# Patient Record
Sex: Female | Born: 1985 | Race: Black or African American | Hispanic: No | Marital: Single | Smoking: Current every day smoker
Health system: Southern US, Community
[De-identification: ages and names within clinical notes are randomized; demographics above are authoritative.]

## PROBLEM LIST (undated history)

## (undated) ENCOUNTER — Ambulatory Visit (HOSPITAL_COMMUNITY): Discharge status: Absent without leave | Payer: Self-pay

## (undated) DIAGNOSIS — I10 Essential (primary) hypertension: Secondary | ICD-10-CM

## (undated) DIAGNOSIS — K859 Acute pancreatitis without necrosis or infection, unspecified: Secondary | ICD-10-CM

## (undated) DIAGNOSIS — F419 Anxiety disorder, unspecified: Secondary | ICD-10-CM

## (undated) HISTORY — DX: Anxiety disorder, unspecified: F41.9

## (undated) HISTORY — DX: Essential (primary) hypertension: I10

---
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
## 2007-01-12 ENCOUNTER — Inpatient Hospital Stay (HOSPITAL_COMMUNITY): Admission: AD | Admit: 2007-01-12 | Discharge: 2007-01-12 | Payer: Self-pay | Admitting: Obstetrics

## 2007-02-18 ENCOUNTER — Ambulatory Visit (HOSPITAL_COMMUNITY): Admission: RE | Admit: 2007-02-18 | Discharge: 2007-02-18 | Payer: Self-pay | Admitting: Obstetrics & Gynecology

## 2007-02-23 ENCOUNTER — Ambulatory Visit (HOSPITAL_COMMUNITY): Admission: RE | Admit: 2007-02-23 | Discharge: 2007-02-23 | Payer: Self-pay | Admitting: Obstetrics & Gynecology

## 2007-02-24 ENCOUNTER — Inpatient Hospital Stay (HOSPITAL_COMMUNITY): Admission: AD | Admit: 2007-02-24 | Discharge: 2007-02-25 | Payer: Self-pay | Admitting: Obstetrics

## 2007-03-07 ENCOUNTER — Inpatient Hospital Stay (HOSPITAL_COMMUNITY): Admission: AD | Admit: 2007-03-07 | Discharge: 2007-03-07 | Payer: Self-pay | Admitting: Obstetrics & Gynecology

## 2007-03-16 ENCOUNTER — Inpatient Hospital Stay (HOSPITAL_COMMUNITY): Admission: AD | Admit: 2007-03-16 | Discharge: 2007-03-21 | Payer: Self-pay | Admitting: Obstetrics

## 2007-03-18 ENCOUNTER — Encounter: Payer: Self-pay | Admitting: Obstetrics & Gynecology

## 2007-11-17 ENCOUNTER — Emergency Department (HOSPITAL_COMMUNITY): Admission: EM | Admit: 2007-11-17 | Discharge: 2007-11-17 | Payer: Self-pay | Admitting: Emergency Medicine

## 2008-12-15 ENCOUNTER — Emergency Department (HOSPITAL_COMMUNITY): Admission: EM | Admit: 2008-12-15 | Discharge: 2008-12-15 | Payer: Self-pay | Admitting: Family Medicine

## 2009-03-24 IMAGING — US US OB RE-EVAL
1 series · 14 of 28 positions shown · non-contrast
Comparison: none

OBSTETRICAL ULTRASOUND:

 This ultrasound exam was performed in the [HOSPITAL] Ultrasound Department.  The OB US report was generated in the AS system, and faxed to the ordering physician.  This report is also available in [REDACTED] PACS.

[Series 1: us ob re-eval · 14 of 29 slices shown]
[im 2/29]
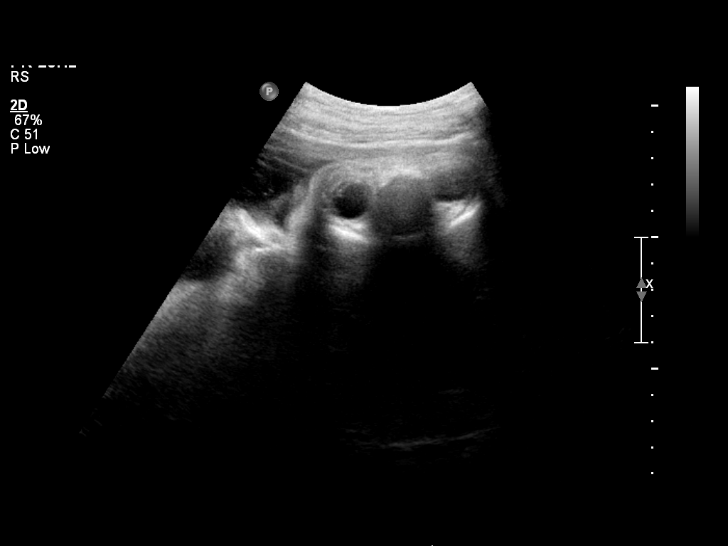
[im 4/29]
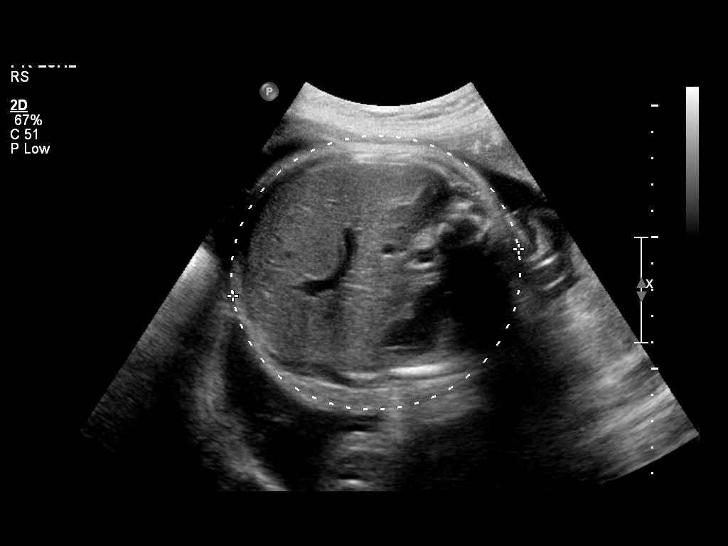
[im 6/29]
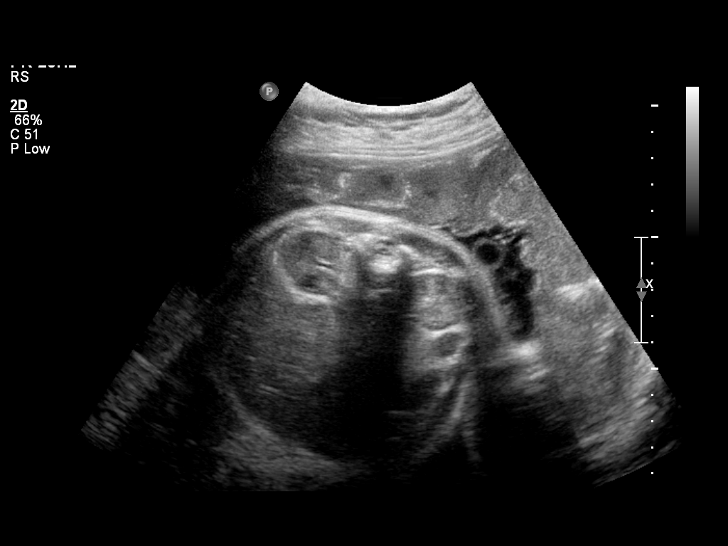
[im 8/29]
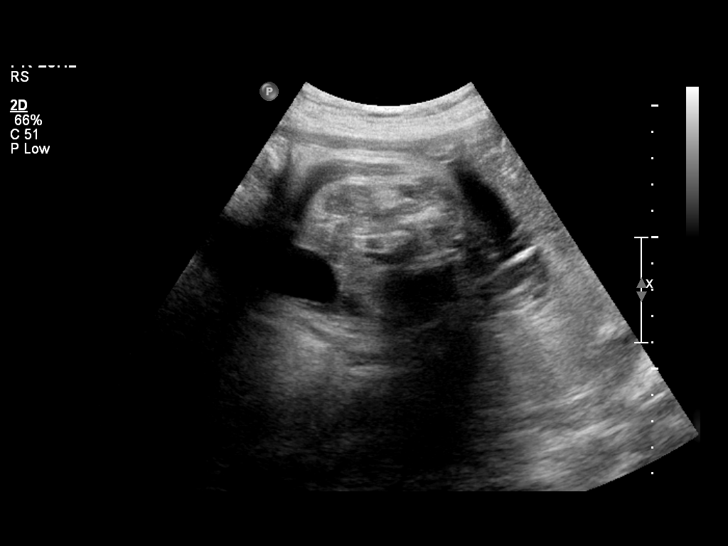
[im 10/29]
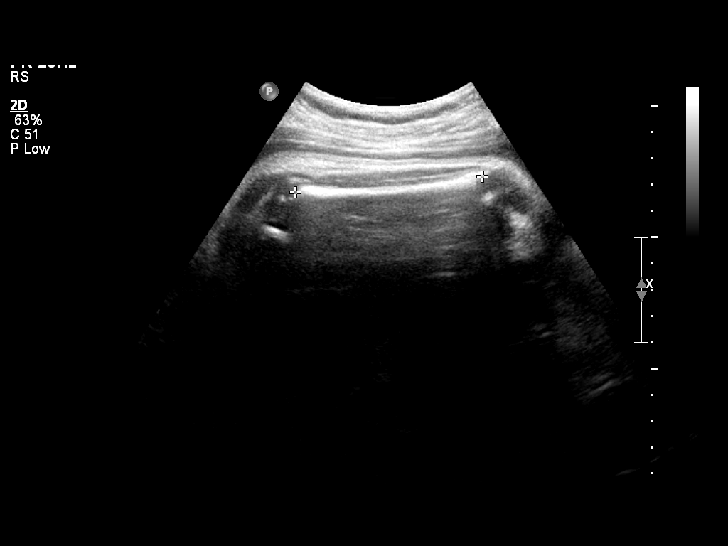
[im 12/29]
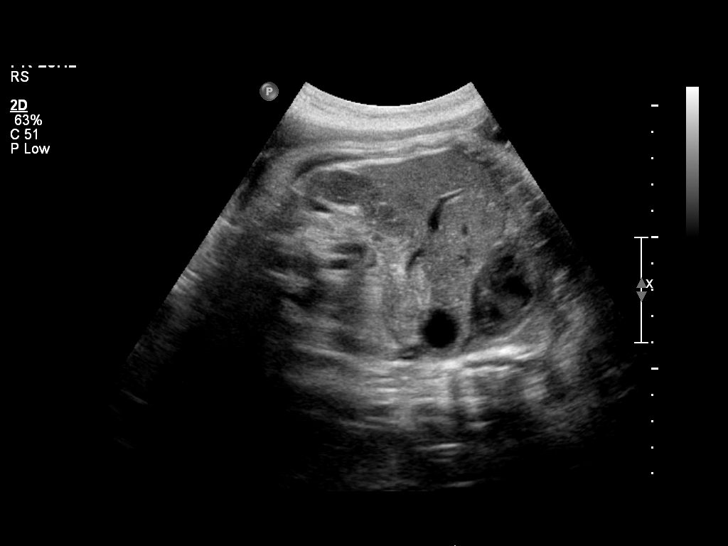
[im 14/29]
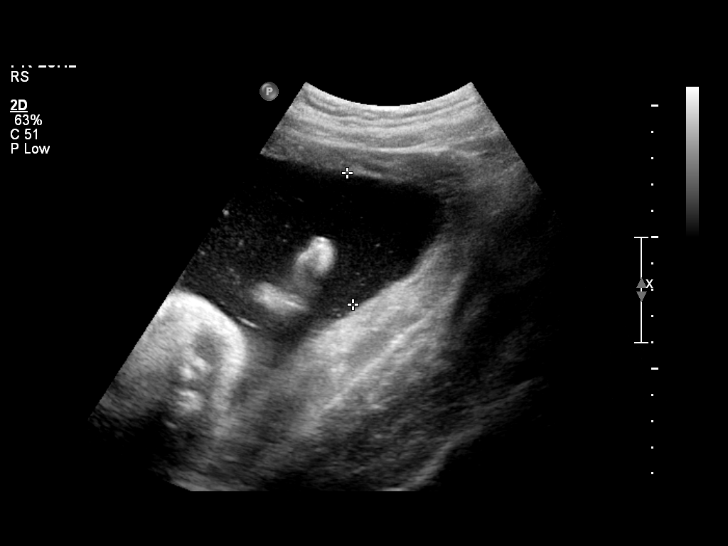
[im 16/29]
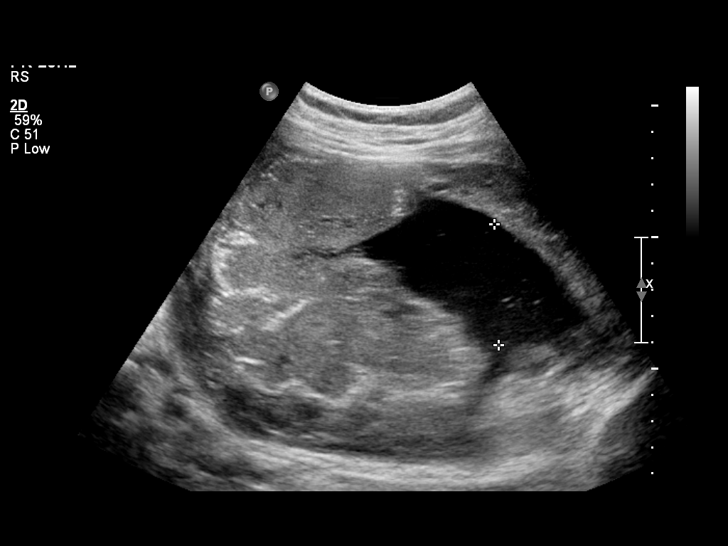
[im 18/29]
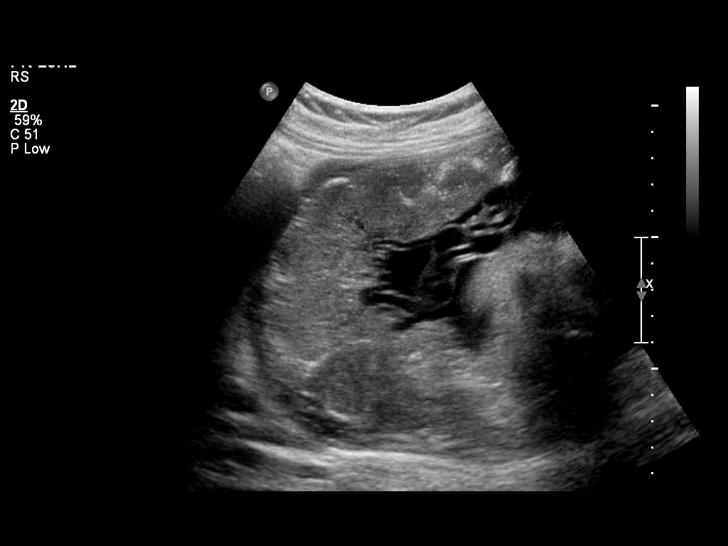
[im 20/29]
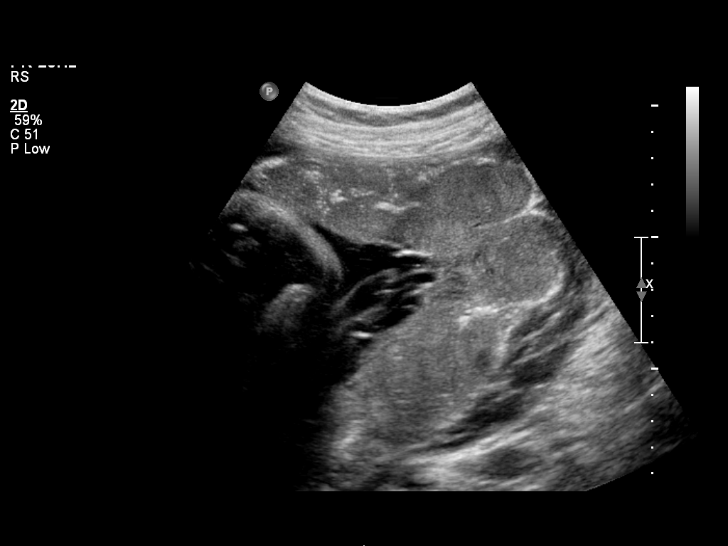
[im 22/29]
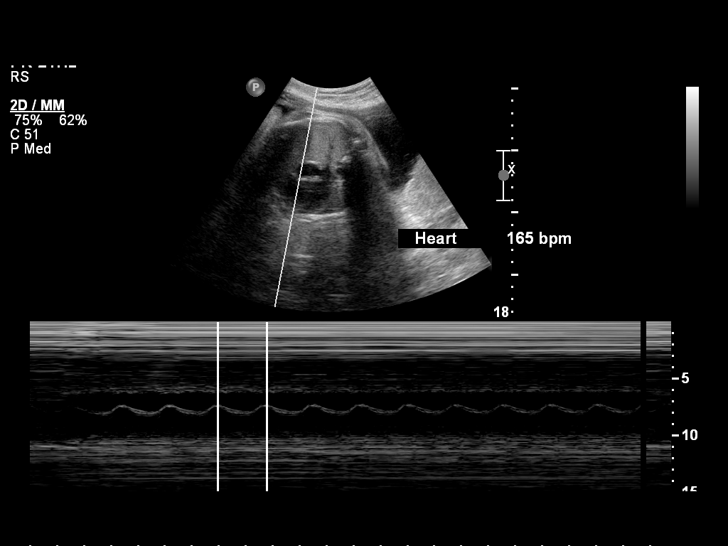
[im 24/29]
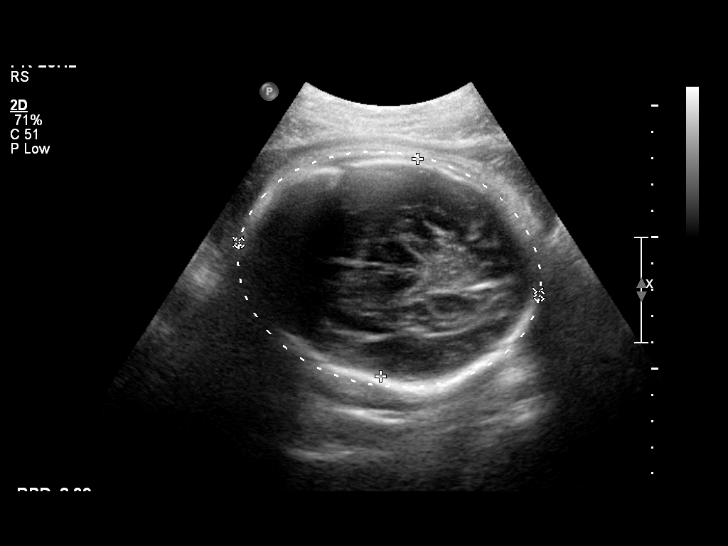
[im 26/29]
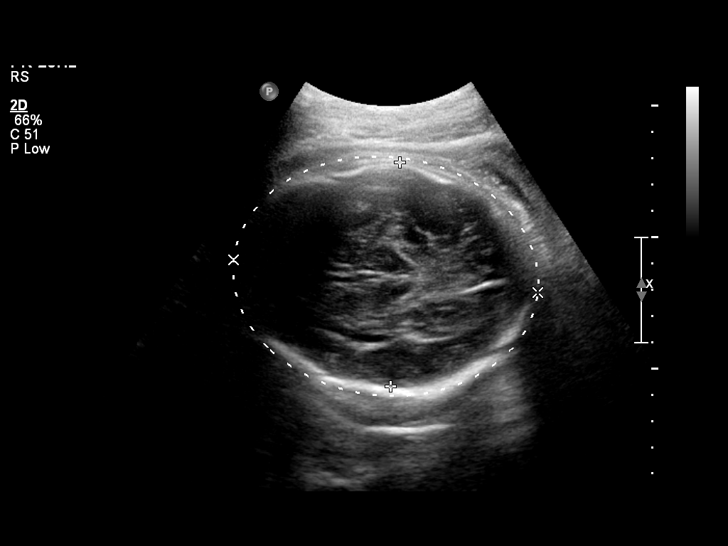
[im 29/29]
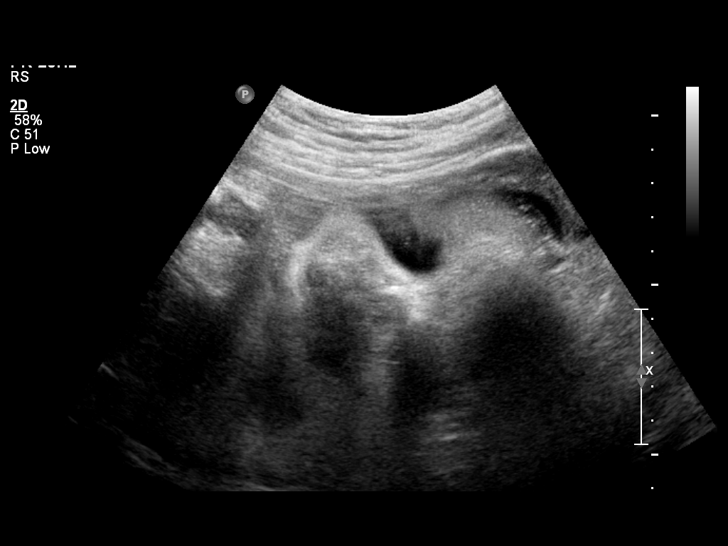

[14 of 28 positions shown; findings below may reference images not displayed]

IMPRESSION: See AS Obstetric US report.

## 2010-09-17 NOTE — Op Note (Signed)
NAMESHANTICE, MENGER             ACCOUNT NO.:  [id number]   MEDICAL RECORD NO.:  [id number]          PATIENT TYPE:  INP   LOCATION:  9158                          FACILITY:  [facility]   PHYSICIAN:  Roseanna Rainbow, M.D.DATE OF BIRTH:  Aug 21, 1985   DATE OF PROCEDURE:  03/18/2007  DATE OF DISCHARGE:                               OPERATIVE REPORT   PREOPERATIVE DIAGNOSIS:  Intrauterine pregnancy at term, pregnancy  induced hypertension, arrest of dilatation, active phase of labor,  suspected large for gestational age fetus.   POSTOPERATIVE DIAGNOSIS:  Intrauterine pregnancy at term, pregnancy  induced hypertension, arrest of dilatation, active phase of labor,  suspected large for gestational age fetus.   PROCEDURE:  Primary low uterine flap elliptical cesarean delivery via  Pfannenstiel skin incision.   SURGEON:  Dr. Tamela Oddi and Dr. Clearance Coots.   ANESTHESIA:  Epidural.   PATHOLOGY:  Placenta.   ESTIMATED BLOOD LOSS:  600 mL.   COMPLICATIONS:  None.   PROCEDURE:  The patient was taken to the operating room with an epidural  catheter in situ and IV running as well as a Foley catheter in place.  She was placed in the dorsal supine position with a leftward tilt and  prepped and draped in the usual sterile fashion.  After a time-out had  been completed, a Pfannenstiel skin incision was made with the scalpel  and carried down to the underlying fascia.  The fascia was nicked in the  midline.  The fascial incision was then tented up and the underlying  rectus muscles dissected off.  The inferior aspect of the fascial  incision was manipulated in a similar fashion.  The rectus muscles were  separated in the midline.  The parietal peritoneum was entered bluntly.  This incision was then extended superiorly and inferiorly with good  visualization of the bladder.  The Alexis retractor was then placed into  the incision.  The vesicouterine peritoneum was tented up and entered  sharply  with Metzenbaum scissors.  This incision was then extended  bilaterally and the bladder flap created bluntly.  The lower uterine  segment was then incised in a transverse fashion with a scalpel.  The  incision was then extended bluntly.  The infant's head was then  delivered atraumatically.  The oropharynx was suctioned with bulb  suction.  There was a loose nuchal cord that was easily reduced.  The  cord was clamped and cut.  The infant was handed off to the awaiting  neonatologist.  The placenta was then removed.  The intrauterine cavity  was evacuated of any remaining amniotic fluid, clots and debris with a  moistened laparotomy sponge.  The uterine incision was then  reapproximated in a running interlocking fashion using 0 Monocryl.  A  second imbricating layer of the same suture was then placed.  Adequate  hemostasis was noted.  The paracolic gutters were then irrigated.  The  retractor was then removed.  The parietal peritoneum was reapproximated  in a running fashion using 2-0 Vicryl.  The fascia was then  reapproximated in a running fashion using 0 Vicryl.  The skin  was closed  with staples.  At the close of the procedure the instrument and pack  counts were said to be correct x2.  The patient was taken to the PACU  awake and in stable condition.      Roseanna Rainbow, M.D.  Electronically Signed     LAJ/MEDQ  D:  03/18/2007  T:  03/19/2007  Job:  [id number]

## 2010-09-17 NOTE — H&P (Signed)
Veronica Calhoun, Veronica Calhoun             ACCOUNT NO.:  [id number]   MEDICAL RECORD NO.:  [id number]          PATIENT TYPE:  INP   LOCATION:  9162                          FACILITY:  [facility]   PHYSICIAN:  Roseanna Rainbow, M.D.DATE OF BIRTH:  November 27, 1985   DATE OF ADMISSION:  03/16/2007  DATE OF DISCHARGE:                              HISTORY & PHYSICAL   CHIEF COMPLAINT:  The patient is a 25 year old gravida 1, para 0 with an  estimated date of confinement of March 27, 2007 with an intrauterine  pregnancy at 38+ weeks with pregnancy induced hypertension for induction  of labor.   HISTORY OF PRESENT ILLNESS:  Please see the above.  Over the last  several weeks, the patient's blood pressures have been in the 130-  140s/70-90s range.  Antenatal fetal testing has been stable.  A recent  ultrasound for growth was suspicious for a large for gestational age  fetus.   ALLERGIES:  NO KNOWN DRUG ALLERGIES.   MEDICATIONS:  Please see the reconciliation form.   OBSTETRIC RISK FACTORS:  Please see the above urinary tract infection  adolescence.  GBS positive.   LABORATORY DATA:  Prenatal labs:  Quad screen negative.  Platelets  195,000, hemoglobin 11.7, hematocrit 34.3, cystic fibrosis negative.  Urine culture and sensitivity September 2008:  Enterococcus.  April  2008:  Staphylococcus.  June 2008: mixed commensals.  One hour GCT 93.  HIV nonreactive.  Sickle cell negative.  Rubella immune.  RPR  nonreactive.  Blood type is O+.  Antibody screen negative.  Pap smear  negative.  On February 17, 2007:  Uric acid 4.1.  SGOT and SGPT normal.  GBS positive on February 28, 2007.   PAST GYNECOLOGICAL HISTORY:  Noncontributory.   PAST MEDICAL HISTORY:  No significant history of medical diseases.   PAST SURGICAL HISTORY:  No previous surgeries.   SOCIAL HISTORY:  She is single.  She is a Consulting civil engineer.  Does not give  significant history of alcohol use.  She has no significant smoking  history.  Denies  illicit drug use.   FAMILY HISTORY:  Hypertension, diabetes mellitus.   PHYSICAL EXAMINATION:  VITAL SIGNS:  Blood pressure 130-150s/70-90s,  fetal heart tracing reassuring,  tocodynometer regular contractions.  ABDOMEN:  Gravid.  Sterile vaginal exam per the RN.  EXTREMITIES:  Lower extremity pretibial edema 2-3+.   ASSESSMENT:  1. Intrauterine pregnancy at 38+ weeks with pregnancy induced      hypertension.  2. Unfavorable Bishop score.  3. Suspected large for gestational age fetus.  4. Group B strep positive.   PLAN:  1. Admission, two stage induction of labor.  2. We will check a PIH panel.      Roseanna Rainbow, M.D.  Electronically Signed     LAJ/MEDQ  D:  03/17/2007  T:  03/17/2007  Job:  [id number]

## 2010-09-20 NOTE — Discharge Summary (Signed)
NAMELORRY, FURBER             ACCOUNT NO.:  [id number]   MEDICAL RECORD NO.:  [id number]          PATIENT TYPE:  INP   LOCATION:  9113                          [facility]ACILITY:  [facility]   PHYSICIAN:  Roseanna Rainbow, M.D.DATE OF BIRTH:  11-02-1985   DATE OF ADMISSION:  03/16/2007  DATE OF DISCHARGE:  03/21/2007                               DISCHARGE SUMMARY   CHIEF COMPLAINT:  The patient is a 25 year old gravida 1, para 0 with an  estimated date of confinement of November 22, with an intrauterine  pregnancy at 38+ weeks with pregnancy-induced hypertension for induction  of labor.  Please see the dictated history and physical.   HOSPITAL COURSE:  The patient was admitted, and the cervix was ripened  with Cervidil.  She progressed in early labor.  The membranes were  artificially ruptured.  The low-dose Pitocin was administered, as well  as magnesium sulfate, seizure prophylaxis.  She was also given  penicillin GBS prophylaxis.  She progressed to 6-7 cm of dilatation,  occiput transverse.  There was no further dilatation or descent, at  which point the decision was made to proceed with a cesarean delivery.  Please see the dictated operative summary.  On postoperative day #1, her  hemoglobin was 9.4.  Her blood pressures remained stable.  She has been  given labetalol orally for blood pressure control.  On postoperative day  #3, hydrochlorothiazide was added to the regimen, and she was discharged  to home.   DISCHARGE DIAGNOSIS:  Intrauterine pregnancy at term, pregnancy induced  hypertension, arrest of dilatation active labor.   PROCEDURES:  Cesarean delivery, condition stable.   DIET:  Regular.   ACTIVITY:  Pelvic rest, progressive activity.   MEDICATIONS:  Included Percocet, labetalol as hydrochlorothiazide,  prenatal vitamins.   DISPOSITION:  The patient was to follow up in the office in several  days.      Roseanna Rainbow, M.D.  Electronically Signed     LAJ/MEDQ  D:  04/08/2007  T:  04/08/2007  Job:  [id number]

## 2011-02-11 LAB — COMPREHENSIVE METABOLIC PANEL
ALT: 10
ALT: 12
AST: 17
AST: 17
Albumin: 2.3 — ABNORMAL LOW
Albumin: 2.4 — ABNORMAL LOW
Alkaline Phosphatase: 153 — ABNORMAL HIGH
Alkaline Phosphatase: 202 — ABNORMAL HIGH
BUN: 2 — ABNORMAL LOW
CO2: 21
CO2: 23
CO2: 24
Chloride: 108
Chloride: 108
Creatinine, Ser: 0.47
GFR calc Af Amer: >60
GFR calc Af Amer: >60
GFR calc Af Amer: >60
Glucose, Bld: 87
Potassium: 3.6
Potassium: 4
Sodium: 135
Sodium: 137
Total Bilirubin: 0.6
Total Protein: 5.7 — ABNORMAL LOW
Total Protein: 6

## 2011-02-11 LAB — LACTATE DEHYDROGENASE
LDH: 177
LDH: 269 — ABNORMAL HIGH

## 2011-02-11 LAB — CBC
HCT: 26.5 — ABNORMAL LOW
HCT: 31.6 — ABNORMAL LOW
Hemoglobin: 11.2 — ABNORMAL LOW
Hemoglobin: 11.4 — ABNORMAL LOW
Hemoglobin: 9.4 — ABNORMAL LOW
MCHC: 33.9
MCHC: 34.5
MCHC: 35.3
MCV: 85
MCV: 85.5
Platelets: 204
RBC: 3.1 — ABNORMAL LOW
RDW: 14
RDW: 14.5
RDW: 15.5

## 2011-02-11 LAB — URINALYSIS, ROUTINE W REFLEX MICROSCOPIC
Glucose, UA: NEGATIVE
Hgb urine dipstick: NEGATIVE
Specific Gravity, Urine: 1.01
Urobilinogen, UA: 0.2

## 2011-02-11 LAB — RPR: RPR Ser Ql: NONREACTIVE

## 2011-02-11 LAB — URIC ACID: Uric Acid, Serum: 5.7

## 2011-02-12 LAB — URINALYSIS, ROUTINE W REFLEX MICROSCOPIC
Hgb urine dipstick: NEGATIVE
Nitrite: POSITIVE — AB
Protein, ur: NEGATIVE
Urobilinogen, UA: 0.2

## 2011-02-12 LAB — URINE CULTURE

## 2011-02-12 LAB — COMPREHENSIVE METABOLIC PANEL
Alkaline Phosphatase: 218 — ABNORMAL HIGH
CO2: 23
Calcium: 8.8
Chloride: 105
Creatinine, Ser: 0.55
GFR calc non Af Amer: >60
Glucose, Bld: 123 — ABNORMAL HIGH
Potassium: 4.2
Sodium: 135

## 2011-02-12 LAB — URINE MICROSCOPIC-ADD ON

## 2011-02-12 LAB — URIC ACID: Uric Acid, Serum: 4

## 2011-02-12 LAB — CBC
MCHC: 34.7
RDW: 14.9 — ABNORMAL HIGH
WBC: 8.4

## 2011-02-14 LAB — URINALYSIS, ROUTINE W REFLEX MICROSCOPIC
Glucose, UA: NEGATIVE
Glucose, UA: NEGATIVE
Hgb urine dipstick: NEGATIVE
Ketones, ur: NEGATIVE
Nitrite: NEGATIVE
Specific Gravity, Urine: 1.015
Specific Gravity, Urine: 1.01
pH: 7.5
pH: 6.5

## 2011-02-14 LAB — COMPREHENSIVE METABOLIC PANEL
ALT: 19
AST: 19
AST: 20
Albumin: 2.6 — ABNORMAL LOW
Albumin: 2.8 — ABNORMAL LOW
Alkaline Phosphatase: 86
Alkaline Phosphatase: 92
BUN: 3 — ABNORMAL LOW
CO2: 24
Chloride: 104
GFR calc Af Amer: >60
GFR calc Af Amer: >60
GFR calc non Af Amer: >60
Potassium: 3.6
Potassium: 3.8
Sodium: 137
Total Bilirubin: 0.5
Total Protein: 6.3

## 2011-02-14 LAB — DIFFERENTIAL
Basophils Absolute: 0
Basophils Relative: 0
Eosinophils Relative: 1
Monocytes Absolute: 0.8

## 2011-02-14 LAB — URINE MICROSCOPIC-ADD ON

## 2011-02-14 LAB — CBC
HCT: 33.9 — ABNORMAL LOW
Platelets: 207
Platelets: 212
RBC: 3.84
RDW: 13.8
WBC: 11.6 — ABNORMAL HIGH

## 2011-02-14 LAB — URINE CULTURE

## 2011-02-14 LAB — URIC ACID: Uric Acid, Serum: 3

## 2011-05-06 DIAGNOSIS — F419 Anxiety disorder, unspecified: Secondary | ICD-10-CM | POA: Insufficient documentation

## 2011-05-06 HISTORY — DX: Anxiety disorder, unspecified: F41.9

## 2012-05-05 DIAGNOSIS — I10 Essential (primary) hypertension: Secondary | ICD-10-CM

## 2012-05-05 HISTORY — DX: Essential (primary) hypertension: I10

## 2013-01-10 ENCOUNTER — Emergency Department (HOSPITAL_COMMUNITY)
Admission: EM | Admit: 2013-01-10 | Discharge: 2013-01-10 | Disposition: A | Discharge status: Home / Self Care | Payer: Self-pay | Attending: Emergency Medicine | Admitting: Emergency Medicine

## 2013-01-10 ENCOUNTER — Encounter (HOSPITAL_COMMUNITY): Payer: Self-pay | Admitting: Emergency Medicine

## 2013-01-10 DIAGNOSIS — F411 Generalized anxiety disorder: Secondary | ICD-10-CM | POA: Insufficient documentation

## 2013-01-10 DIAGNOSIS — F419 Anxiety disorder, unspecified: Secondary | ICD-10-CM

## 2013-01-10 DIAGNOSIS — R030 Elevated blood-pressure reading, without diagnosis of hypertension: Secondary | ICD-10-CM | POA: Insufficient documentation

## 2013-01-10 DIAGNOSIS — F17200 Nicotine dependence, unspecified, uncomplicated: Secondary | ICD-10-CM | POA: Insufficient documentation

## 2013-01-10 MED ORDER — LORAZEPAM 1 MG PO TABS
1.0000 mg | ORAL_TABLET | Freq: Once | ORAL | Status: AC
  Administered 2013-01-10: 1 mg via ORAL
  Filled 2013-01-10: qty 1

## 2013-01-10 NOTE — ED Notes (Signed)
Pt states that she feels nervous and shaky.  States that her breathing feels "stuffed".  States that this has been going on for 6 months and she has been seeing her PCP for it.  States that one time, her BP was high.

## 2013-01-10 NOTE — Progress Notes (Signed)
[facility] provided pt with a list of primary care resources in [place].  °

## 2013-01-10 NOTE — ED Provider Notes (Signed)
CSN: [id number]     Arrival date & time 01/10/13  1357 History  This chart was scribed for non-physician practitioner Georgeanna Harrison, working with Nelia Shi, MD by Dorothey Baseman, ED Scribe. This patient was seen in room WTR8/WTR8 and the patient's care was started at 3:18 PM.  Chief Complaint  Patient presents with  . Anxiety    The history is provided by the patient. No language interpreter was used.   HPI Comments: Veronica Calhoun is a 27 y.o. female with a history of anxiety attacks who presents to the Emergency Department complaining of recently exacerbated anxiety that she reports may be associated with starting a new job last week. She reports she has been prescribed Xanax in the past, but states that she did not like the effects. The patient reports that she has recently had episodes of high blood pressure, but has not since followed up with her PCP. She states that she has recently acquired health insurance again, but has not received her insurance card. She denies any suicidal or homicidal ideations.  She denies chest pain, SOB, headache, numbness, tingling, or weakness.  History reviewed. No pertinent past medical history. Past Surgical History  Procedure Laterality Date  . Cesarean section     No family history on file. History  Substance Use Topics  . Smoking status: Current Every Day Smoker -- 0.50 packs/day    Types: Cigarettes  . Smokeless tobacco: Not on file  . Alcohol Use: No   OB History   Grav Para Term Preterm Abortions TAB SAB Ect Mult Living                 Review of Systems A complete 10 system review of systems was obtained and all systems are negative except as noted in the HPI and PMH.   Allergies  Review of patient's allergies indicates no known allergies.  Home Medications   Current Outpatient Rx  Name  Route  Sig  Dispense  Refill  . levonorgestrel (MIRENA) 20 MCG/24HR IUD   Intrauterine   1 each by Intrauterine route once.          Triage Vitals: BP 182/117  Pulse 121  Temp(Src) 98.5 F (36.9 C) (Oral)  Resp 18  SpO2 99%  LMP 01/10/2013  Physical Exam  Nursing note and vitals reviewed. Constitutional: She is oriented to person, place, and time. She appears well-developed and well-nourished. No distress.  Non-toxic appearance.  HENT:  Head: Normocephalic and atraumatic.  Eyes: Conjunctivae are normal.  Neck: Normal range of motion. Neck supple.  Cardiovascular: Normal rate, regular rhythm and normal heart sounds.   Pulmonary/Chest: Effort normal and breath sounds normal.  Musculoskeletal: Normal range of motion.  Neurological: She is alert and oriented to person, place, and time.  Skin: Skin is warm and dry.  Psychiatric: Her behavior is normal. Thought content normal. Her mood appears anxious. She expresses no homicidal and no suicidal ideation. She expresses no suicidal plans and no homicidal plans.    ED Course  Procedures (including critical care time)  Medications  LORazepam (ATIVAN) tablet 1 mg (1 mg Oral Given 01/10/13 1540)   DIAGNOSTIC STUDIES: Oxygen Saturation is 99% on room air, normal by my interpretation.    COORDINATION OF CARE: 3:20PM- Discussed that high blood pressure could be secondary to the anxiety. Discussed that she will not be treated for high blood pressure today in the ED. Advised patient to follow up with her PCP concerning her elevated blood  pressure.   Will order a single dose of Ativan. Discussed treatment plan with patient at bedside and patient verbalized agreement.     Labs Review Labs Reviewed - No data to display Imaging Review No results found.  MDM  No diagnosis found. Patient presenting with anxiety.  She denies HI or SI.  She reports that she has been on Xanax in the past, but did not like the way the medication made her feel.  Patient does have a Primary Care Physician.  Patient instructed to follow up with her PCP regarding her anxiety and elevated blood  pressure.  Return precautions given. I personally performed the services described in this documentation, which was scribed in my presence. The recorded information has been reviewed and is accurate.     Pascal Lux [place], PA-C 01/11/13 1059

## 2013-01-11 NOTE — ED Provider Notes (Signed)
Medical screening examination/treatment/procedure(s) were performed by non-physician practitioner and as supervising physician I was immediately available for consultation/collaboration.    Mostyn Varnell L Kannon Baum, MD 01/11/13 1507 

## 2013-04-01 ENCOUNTER — Ambulatory Visit: Payer: PRIVATE HEALTH INSURANCE | Admitting: Physician Assistant

## 2013-04-01 VITALS — BP 146/94 | HR 99 | Temp 99.8°F | Resp 16 | Ht 66.5 in | Wt 230.4 lb

## 2013-04-01 DIAGNOSIS — F411 Generalized anxiety disorder: Secondary | ICD-10-CM

## 2013-04-01 DIAGNOSIS — F410 Panic disorder [episodic paroxysmal anxiety] without agoraphobia: Secondary | ICD-10-CM | POA: Insufficient documentation

## 2013-04-01 DIAGNOSIS — R030 Elevated blood-pressure reading, without diagnosis of hypertension: Secondary | ICD-10-CM

## 2013-04-01 DIAGNOSIS — M542 Cervicalgia: Secondary | ICD-10-CM

## 2013-04-01 DIAGNOSIS — M62838 Other muscle spasm: Secondary | ICD-10-CM

## 2013-04-01 HISTORY — DX: Generalized anxiety disorder: F41.1

## 2013-04-01 MED ORDER — CYCLOBENZAPRINE HCL 5 MG PO TABS: 5.0000 mg | ORAL_TABLET | Freq: Three times a day (TID) | ORAL | Status: DC | PRN

## 2013-04-01 MED ORDER — DICLOFENAC SODIUM 75 MG PO TBEC: 75.0000 mg | DELAYED_RELEASE_TABLET | Freq: Two times a day (BID) | ORAL | Status: DC

## 2013-04-01 NOTE — Patient Instructions (Signed)
1- discuss your BP at your next PCP visit 2- take 1/2 pill of Lexapro for the 1st about week - take at night to decrease your side effects - it may take about 3-4 weeks to start to notice results - the results should be less nervous/anxious 3- check on your thyroid function

## 2013-04-01 NOTE — Progress Notes (Signed)
   Subjective:    Patient ID: Veronica Calhoun, female    DOB: 25-Jan-1986, 27 y.o.   MRN: [id number]  HPI Pt presents to clinic with several concerns 1- she has been having problems with anxiety for several years.  Her daughter is 6 and it has definitely gotten worse since then - within the last week she has been started on Klonopin and Lexapro for her anxiety.  She took 1 dose of Lexapro and it made her groggy and she has not taken it since.  She feels like the Klonopin is helping.  She is always nervous on the inside and has palpitations and does have pani attacks.  She has seen a counselor in the past but none recently. 2- She is worried about her BP.  It has been high since the birth of her daughter but no one has started her on medications.  She does not check it at home.  She has family h/o HTN. 3- She has had L sided neck pain for the last several days and thinks she slept funny one night - it is stiff and hurts to turn her neck.  She has taken no medications for the pain.   Review of Systems  Musculoskeletal: Positive for neck pain and neck stiffness (only on the left side).  Psychiatric/Behavioral: Positive for sleep disturbance. Negative for decreased concentration. The patient is nervous/anxious.        Objective:   Physical Exam  Vitals reviewed. Constitutional: She is oriented to person, place, and time. She appears well-developed and well-nourished.  HENT:  Head: Normocephalic and atraumatic.  Right Ear: External ear normal.  Left Ear: External ear normal.  Eyes: Conjunctivae are normal.  Neck: Normal range of motion. Muscular tenderness (left sided trapezius muscle spasm and TTp) present. No rigidity. Normal range of motion present. No thyromegaly present.  Cardiovascular: Normal rate, regular rhythm and normal heart sounds.   No murmur heard. Pulmonary/Chest: Effort normal and breath sounds normal.  Neurological: She is alert and oriented to person, place, and time.    Skin: Skin is warm and dry.  Psychiatric: She has a normal mood and affect. Her behavior is normal. Judgment and thought content normal.       Assessment & Plan:  Muscle spasm - Plan: cyclobenzaprine (FLEXERIL) 5 MG tablet Cervical pain - Plan: diclofenac (VOLTAREN) 75 MG EC tablet -- Neck pain seems to be muscular in etiology.  She will use heat and gentle ROM.  She will f/u with [place] in 1 week or with her PCP.  Elevated BP - pt to monitor and f/u with her PCP in the next 2 weeks.  With that patient's history I think that she is probably in need on HTN meds - it is possible that this is related to her anxiety but with her family history and continued elevated BP I think even with the treated anxiety this may not be in normal range -  Anxiety, generalized - continue Klonopin - we talked about the typed of medications that are used for anxiety and how they work and what to expect.  She will try to take 1/2 of her lexapro for several days and take it at night to see if that will make a difference in her SE.  She will f/u with her PCP.  Did suggest that pt seek therapy.  Benny Lennert PA-C 04/01/2013 2:38 PM

## 2017-06-10 ENCOUNTER — Ambulatory Visit: Payer: Self-pay | Admitting: Internal Medicine

## 2017-06-29 ENCOUNTER — Ambulatory Visit: Payer: Self-pay | Admitting: Internal Medicine

## 2017-08-14 ENCOUNTER — Ambulatory Visit: Payer: Self-pay | Admitting: Internal Medicine

## 2017-09-02 ENCOUNTER — Encounter: Payer: Self-pay | Admitting: Internal Medicine

## 2017-09-02 ENCOUNTER — Ambulatory Visit (INDEPENDENT_AMBULATORY_CARE_PROVIDER_SITE_OTHER): Payer: Self-pay | Admitting: Internal Medicine

## 2017-09-02 VITALS — BP 180/100 | HR 86 | Resp 12 | Ht 65.5 in | Wt 231.0 lb

## 2017-09-02 DIAGNOSIS — F419 Anxiety disorder, unspecified: Secondary | ICD-10-CM

## 2017-09-02 DIAGNOSIS — F410 Panic disorder [episodic paroxysmal anxiety] without agoraphobia: Secondary | ICD-10-CM

## 2017-09-02 DIAGNOSIS — R002 Palpitations: Secondary | ICD-10-CM

## 2017-09-02 DIAGNOSIS — R8290 Unspecified abnormal findings in urine: Secondary | ICD-10-CM

## 2017-09-02 DIAGNOSIS — I10 Essential (primary) hypertension: Secondary | ICD-10-CM

## 2017-09-02 LAB — POCT URINALYSIS DIPSTICK
BILIRUBIN UA: NEGATIVE
Blood, UA: NEGATIVE
GLUCOSE UA: NEGATIVE
KETONES UA: NEGATIVE
Leukocytes, UA: NEGATIVE
Nitrite, UA: NEGATIVE
Protein, UA: NEGATIVE
Spec Grav, UA: <=1.005 — AB (ref 1.010–1.025)
Urobilinogen, UA: 0.2 E.U./dL
pH, UA: 6 (ref 5.0–8.0)

## 2017-09-02 MED ORDER — LABETALOL HCL 100 MG PO TABS: 100.0000 mg | ORAL_TABLET | Freq: Two times a day (BID) | ORAL | 11 refills | Status: DC

## 2017-09-02 MED ORDER — SERTRALINE HCL 25 MG PO TABS: ORAL_TABLET | ORAL | 0 refills | Status: DC

## 2017-09-02 NOTE — Patient Instructions (Signed)

## 2017-09-02 NOTE — Progress Notes (Signed)
LCSW completed new screening with pt. Pt did not have any social determinants of health needs at this time. Pt reported that she feels stressed and struggles with anxiety. She agreed that she would like counseling. She also needs an appointment to apply for the [facility].

## 2017-09-02 NOTE — Progress Notes (Signed)
Subjective:    Patient ID: Veronica Calhoun, female    DOB: 05/08/1985, 32 y.o.   MRN: [id number]  HPI   Here to establish  1.  Essential Hypertension:    Was told maybe 5 years ago that may have a problem with her BP.  Was placed on medication for about 6 months about 4 years ago, then lost her insurance.  Cannot recall what the medication was--once daily.  Did control bp.' Apparently had problems with pregnancy 10 years prior as well, but bp resolved to normal after delivered.  Was not on medication at that time as well. Also noted to have high bp with a physical for work at [employer]. Family history of hypertension as well. 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   Sexually active and not using any form of birth control.    2.  Anxiety:  Problems when working for [employer] in 2013.  May have panic attacks as well--palpitations and jittery with dyspnea sometimes. Did get counseling for this in 2014.  Helped a little.  She has met with Macie Burows, LCSW and plans to call to get that set up. Treated with Clonazepam as needed Has symptoms at least 4 times weekly. Took Zoloft in past and helped as well.   No outpatient medications have been marked as taking for the 09/02/17 encounter (Office Visit) with Mack Hook, MD.    No Known Allergies   Past Medical History:  Diagnosis Date  . Anxiety 2013  . Hypertension 2014    Past Surgical History:  Procedure Laterality Date  . CESAREAN SECTION  03/18/2007    Family History  Problem Relation Age of Onset  . Heart murmur Father   . AAA (abdominal aortic aneurysm) Sister   . Urinary tract infection Daughter   . Hypertension Maternal Grandmother     Social History   Socioeconomic History  . Marital status: Single    Spouse name: Not on file  . Number of children: Not on file  . Years of education: Not on file  . Highest education level: Not on file  Occupational History  . Not on file  Social Needs  .  Financial resource strain: Not on file  . Food insecurity:    Worry: Never true    Inability: Never true  . Transportation needs:    Medical: No    Non-medical: No  Tobacco Use  . Smoking status: Current Every Day Smoker    Packs/day: 0.50    Years: 10.00    Pack years: 5.00    Types: Cigarettes  . Smokeless tobacco: Never Used  Substance and Sexual Activity  . Alcohol use: Yes    Comment: rare  . Drug use: No  . Sexual activity: Yes    Partners: Male    Birth control/protection: None  Lifestyle  . Physical activity:    Days per week: 0 days    Minutes per session: 0 min  . Stress: To some extent  Relationships  . Social connections:    Talks on phone: Not on file    Gets together: Not on file    Attends religious service: Not on file    Active member of club or organization: Not on file    Attends meetings of clubs or organizations: Not on file    Relationship status: Not on file  . Intimate partner violence:    Fear of current or ex partner: Not on file  Emotionally abused: No    Physically abused: No    Forced sexual activity: Not on file  Other Topics Concern  . Not on file  Social History Narrative   Lives in [place] with daughter   Has long term boyfriend.      Review of Systems     Objective:   Physical Exam NAD HEENT:  PERRL, EOMI, discs sharp, TMs pearly gray, throat without injection.   Neck Supple, Possibly mildly enlarged thyroid vs excess soft tissue at neck Chest:  CTA CV:  RRR , a bit tachy as anxious, Normal S1 and S2, No S3, S4.  Possible mild systolic murmur--difficult to tell with heart rate.  Normodynamic radial and DP pulses, Abd:  S, NT, No HSM or mass, + BS       Assessment & Plan:  1.  Essential Hypertension:  Choosing Labetalol which would be safe for fetus should she become pregnant.  100 mg twice daily.  BMP and TSH today.  2.  Anxiety:  Encouraged to meet with N. KNight, LCSW.  Start Sertraline 25 mg daily with follow  up in 1 week.  Increase to 50 mg likely then.  Will need 100 mg tabs to cut in half for lower cost at [employer].  3.  Tobacco abuse:  She will plan to quit on her own currently.    4.  Obesity;  Discussed lifestyle changes at length and how this affects bp as well.  At end, brings up concerns for STD check.  Will check this on return in [redacted] week along with fasting labs:  FLP, CBC, hepatic profile.  Discuss Tdap then as well.

## 2017-09-02 NOTE — Addendum Note (Signed)
Addended by: Marcelino Freestone on: 09/02/2017 02:58 PM   Modules accepted: Orders

## 2017-09-03 LAB — BASIC METABOLIC PANEL
BUN / CREAT RATIO: 8 — AB (ref 9–23)
BUN: 5 mg/dL — ABNORMAL LOW (ref 6–20)
CO2: 19 mmol/L — ABNORMAL LOW (ref 20–29)
CREATININE: 0.64 mg/dL (ref 0.57–1.00)
Calcium: 9.9 mg/dL (ref 8.7–10.2)
Chloride: 101 mmol/L (ref 96–106)
GFR calc non Af Amer: 121 mL/min/{1.73_m2} (ref >59)
GFR, EST AFRICAN AMERICAN: 139 mL/min/{1.73_m2} (ref >59)
GLUCOSE: 110 mg/dL — AB (ref 65–99)
Potassium: 4.6 mmol/L (ref 3.5–5.2)
SODIUM: 136 mmol/L (ref 134–144)

## 2017-09-03 LAB — TSH: TSH: 1.72 u[IU]/mL (ref 0.450–4.500)

## 2017-09-09 ENCOUNTER — Ambulatory Visit: Payer: Self-pay | Admitting: Internal Medicine

## 2018-02-08 ENCOUNTER — Other Ambulatory Visit: Payer: Self-pay

## 2018-02-08 ENCOUNTER — Ambulatory Visit (HOSPITAL_COMMUNITY)
Admission: EM | Admit: 2018-02-08 | Discharge: 2018-02-08 | Disposition: A | Discharge status: Home / Self Care | Payer: Self-pay | Attending: Family Medicine | Admitting: Family Medicine

## 2018-02-08 ENCOUNTER — Encounter (HOSPITAL_COMMUNITY): Payer: Self-pay | Admitting: Emergency Medicine

## 2018-02-08 DIAGNOSIS — I10 Essential (primary) hypertension: Secondary | ICD-10-CM | POA: Insufficient documentation

## 2018-02-08 DIAGNOSIS — F419 Anxiety disorder, unspecified: Secondary | ICD-10-CM | POA: Insufficient documentation

## 2018-02-08 DIAGNOSIS — N939 Abnormal uterine and vaginal bleeding, unspecified: Secondary | ICD-10-CM | POA: Insufficient documentation

## 2018-02-08 DIAGNOSIS — Z79899 Other long term (current) drug therapy: Secondary | ICD-10-CM | POA: Insufficient documentation

## 2018-02-08 DIAGNOSIS — F17210 Nicotine dependence, cigarettes, uncomplicated: Secondary | ICD-10-CM | POA: Insufficient documentation

## 2018-02-08 DIAGNOSIS — Z8249 Family history of ischemic heart disease and other diseases of the circulatory system: Secondary | ICD-10-CM | POA: Insufficient documentation

## 2018-02-08 LAB — POCT URINALYSIS DIP (DEVICE)
BILIRUBIN URINE: NEGATIVE
Glucose, UA: NEGATIVE mg/dL
KETONES UR: NEGATIVE mg/dL
Nitrite: NEGATIVE
PH: 6 (ref 5.0–8.0)
Protein, ur: NEGATIVE mg/dL
Urobilinogen, UA: 0.2 mg/dL (ref 0.0–1.0)

## 2018-02-08 LAB — POCT PREGNANCY, URINE: Preg Test, Ur: NEGATIVE

## 2018-02-08 MED ORDER — SERTRALINE HCL 25 MG PO TABS: ORAL_TABLET | ORAL | 0 refills | Status: DC

## 2018-02-08 NOTE — ED Triage Notes (Addendum)
Noticed vaginal bleeding today.  Denies abdominal pain or back pain.   Patient said she placed finger in vagina and felt something, not sure what it may be.

## 2018-02-08 NOTE — ED Provider Notes (Signed)
[facility]    CSN: [id number] Arrival date & time: 02/08/18  1635     History   Chief Complaint Chief Complaint  Patient presents with  . Vaginal Bleeding    HPI Veronica Calhoun is a 32 y.o. female.   HPI Patient is here for irregular menstrual bleeding.  She states her last menstrual period was 01/28/2018.  She states she always has regular periods.  She is having unprotected sexual relations.  She has not felt like she is pregnant.  She is had no vaginal discharge or irritation.  No vaginal odor.  She is requesting testing for STDs.  She declines need for blood work to check RPR and HIV even though it is recommended He has not had a lot of blood, just some spotting the last few days.  She does not feel weak or dizzy.  She does not have a regular GYN.  She has 1 child that is 59 years old.  She has been under stress lately.  She previously was taking Zoloft with good results.  She requests a refill.  She is advised that we will not give her ongoing medications, and I recommend a PCP Past Medical History:  Diagnosis Date  . Anxiety 2013  . Hypertension 2014    Patient Active Problem List   Diagnosis Date Noted  . Anxiety, generalized 04/01/2013  . Panic attacks 04/01/2013  . Essential hypertension 05/05/2012  . Anxiety 05/06/2011    Past Surgical History:  Procedure Laterality Date  . CESAREAN SECTION  03/18/2007    OB History   None      Home Medications    Prior to Admission medications   Medication Sig Start Date End Date Taking? Authorizing Provider  labetalol (NORMODYNE) 100 MG tablet Take 1 tablet (100 mg total) by mouth 2 (two) times daily. 09/02/17  Yes Julieanne Manson, MD  sertraline (ZOLOFT) 25 MG tablet 1 tab by mouth daily for 7 days, then 2 tabs by mouth once daily 02/08/18   Eustace Moore, MD    Family History Family History  Problem Relation Age of Onset  . Heart murmur Father   . AAA (abdominal aortic aneurysm) Sister   .  Urinary tract infection Daughter   . Hypertension Maternal Grandmother     Social History Social History   Tobacco Use  . Smoking status: Current Every Day Smoker    Packs/day: 0.50    Years: 10.00    Pack years: 5.00    Types: Cigarettes  . Smokeless tobacco: Never Used  Substance Use Topics  . Alcohol use: Yes    Comment: rare  . Drug use: No     Allergies   Patient has no known allergies.   Review of Systems Review of Systems  Constitutional: Negative for chills and fever.  HENT: Negative for ear pain and sore throat.   Eyes: Negative for pain and visual disturbance.  Respiratory: Negative for cough and shortness of breath.   Cardiovascular: Negative for chest pain and palpitations.  Gastrointestinal: Negative for abdominal pain and vomiting.  Genitourinary: Positive for menstrual problem and vaginal bleeding. Negative for dysuria and hematuria.  Musculoskeletal: Negative for arthralgias and back pain.  Skin: Negative for color change and rash.  Neurological: Negative for seizures and syncope.  All other systems reviewed and are negative.    Physical Exam Triage Vital Signs ED Triage Vitals  Enc Vitals Group     BP 02/08/18 1726 (!) 209/146  Pulse Rate 02/08/18 1726 (!) 144     Resp 02/08/18 1726 18     Temp 02/08/18 1724 98.3 F (36.8 C)     Temp src --      SpO2 02/08/18 1726 100 %     Weight --      Height --      Head Circumference --      Peak Flow --      Pain Score 02/08/18 1722 0     Pain Loc --      Pain Edu? --      Excl. in GC? --    No data found.  Updated Vital Signs BP (!) 218/152   Pulse (!) 140   Temp 98.3 F (36.8 C)   Resp 16   LMP 01/28/2018   SpO2 97%   Visual Acuity Right Eye Distance:   Left Eye Distance:   Bilateral Distance:    Right Eye Near:   Left Eye Near:    Bilateral Near:     Physical Exam  Constitutional: She appears well-developed and well-nourished. No distress.  HENT:  Head: Normocephalic and  atraumatic.  Mouth/Throat: Oropharynx is clear and moist.  Eyes: Pupils are equal, round, and reactive to light. Conjunctivae are normal.  Neck: Normal range of motion.  Cardiovascular: Normal rate.  Pulmonary/Chest: Effort normal. No respiratory distress.  Abdominal: Soft. She exhibits no distension.  Genitourinary: Vagina normal.  Genitourinary Comments: Speculum exam is normal.  Parous cervical loss.  Small amount of bleeding from cervix.  No lesion identified.  Musculoskeletal: Normal range of motion. She exhibits no edema.  Neurological: She is alert.  Skin: Skin is warm and dry.  Psychiatric: She has a normal mood and affect. Her behavior is normal.     UC Treatments / Results  Labs (all labs ordered are listed, but only abnormal results are displayed) Labs Reviewed  POCT URINALYSIS DIP (DEVICE) - Abnormal; Notable for the following components:      Result Value   Hgb urine dipstick LARGE (*)    Leukocytes, UA TRACE (*)    All other components within normal limits  POCT PREGNANCY, URINE  CERVICOVAGINAL ANCILLARY ONLY    EKG None  Radiology No results found.  Procedures Procedures (including critical care time)  Medications Ordered in UC Medications - No data to display  Initial Impression / Assessment and Plan / UC Course  I have reviewed the triage vital signs and the nursing notes.  Pertinent labs & imaging results that were available during my care of the patient were reviewed by me and considered in my medical decision making (see chart for details).     Hypertension discussed.  She has gone off of her medication.  The importance of continuing to take blood pressure medication is emphasized to her. She is refilled the Zoloft for 1 month only. Dysfunctional uterine bleeding as discussed.  Is likely due to her stress.  If it persist she needs to see a GYN. Final Clinical Impressions(s) / UC Diagnoses   Final diagnoses:  Abnormal uterine bleeding (AUB)      Discharge Instructions     You must go back on your  blood pressure pill I will refill one month of the sertraline ( zoloft) You need to follow up with primary care We did a swab to check for infection.   If there are any abnormal findings that require change in medicine or indicate a positive result, you will be notified.  If  all of your tests are normal, you will not be called.   Irregular bleeding is common and can be caused by stress.  See a gynecologist if it continues.     ED Prescriptions    Medication Sig Dispense Auth. Provider   sertraline (ZOLOFT) 25 MG tablet 1 tab by mouth daily for 7 days, then 2 tabs by mouth once daily 60 tablet [place]ton See Letta Pate, MD     Controlled Substance Prescriptions [place] Controlled Substance Registry consulted? Not Applicable   Eustace Moore, MD 02/08/18 Claria Dice

## 2018-02-08 NOTE — Discharge Instructions (Addendum)
You must go back on your  blood pressure pill I will refill one month of the sertraline ( zoloft) You need to follow up with primary care We did a swab to check for infection.   If there are any abnormal findings that require change in medicine or indicate a positive result, you will be notified.  If all of your tests are normal, you will not be called.   Irregular bleeding is common and can be caused by stress.  See a gynecologist if it continues.

## 2018-02-09 LAB — CERVICOVAGINAL ANCILLARY ONLY
BACTERIAL VAGINITIS: POSITIVE — AB
Candida vaginitis: NEGATIVE
Chlamydia: NEGATIVE
Neisseria Gonorrhea: NEGATIVE
Trichomonas: NEGATIVE

## 2018-02-11 ENCOUNTER — Telehealth (HOSPITAL_COMMUNITY): Payer: Self-pay

## 2018-02-11 MED ORDER — METRONIDAZOLE 500 MG PO TABS: 500.0000 mg | ORAL_TABLET | Freq: Two times a day (BID) | ORAL | 0 refills | Status: DC

## 2018-02-11 NOTE — Telephone Encounter (Signed)
Bacterial vaginosis is positive. This was not treated at the urgent care visit.  Flagyl 500 mg BID x 7 days #14 no refills sent to patients pharmacy of choice.  Pt called and made aware of results and new prescription. Answered all questions and pt verbalized understanding.  

## 2018-02-15 ENCOUNTER — Telehealth (HOSPITAL_COMMUNITY): Payer: Self-pay

## 2018-02-15 NOTE — Telephone Encounter (Signed)
Pt called requesting work note. Attempted to reach patient back. No answer at this time.

## 2018-02-17 ENCOUNTER — Telehealth (HOSPITAL_COMMUNITY): Payer: Self-pay

## 2018-02-17 MED ORDER — METRONIDAZOLE 0.75 % VA GEL: 1.0000 | Freq: Every day | VAGINAL | 0 refills | Status: AC

## 2018-02-17 NOTE — Telephone Encounter (Signed)
Pt called requesting the vaginal gel to treat BV, states she has been trying to take the pills but cannot swallow them so she has been unable to finish them. Rx sent to pharmacy of choice.

## 2018-02-24 ENCOUNTER — Telehealth (HOSPITAL_COMMUNITY): Payer: Self-pay | Admitting: Emergency Medicine

## 2018-02-24 NOTE — Telephone Encounter (Signed)
Pt LVM today at 1410 stating the vaginal gel we prescribed her for BV was $88.  She is requesting a cheaper medication.  Spoke with Dr. Milus Glazier and he stated the other two medications to treat BV do not come in liquid and he recommended the pt try to crush her Flagyl instead.  Called pt and instructed pt to crush the pills.  Pt stated understanding and she was also instructed to follow up with [place] if her symptoms haven't gotten better in the next week to 10 days, completing her course of Flagyl.

## 2018-05-18 ENCOUNTER — Encounter (HOSPITAL_COMMUNITY): Payer: Self-pay | Admitting: Emergency Medicine

## 2018-05-18 ENCOUNTER — Emergency Department (HOSPITAL_COMMUNITY)
Admission: EM | Admit: 2018-05-18 | Discharge: 2018-05-18 | Disposition: A | Discharge status: Home / Self Care | Payer: Self-pay | Attending: Emergency Medicine | Admitting: Emergency Medicine

## 2018-05-18 ENCOUNTER — Emergency Department (HOSPITAL_COMMUNITY): Payer: Self-pay

## 2018-05-18 DIAGNOSIS — Z79899 Other long term (current) drug therapy: Secondary | ICD-10-CM | POA: Insufficient documentation

## 2018-05-18 DIAGNOSIS — R000 Tachycardia, unspecified: Secondary | ICD-10-CM | POA: Insufficient documentation

## 2018-05-18 DIAGNOSIS — F17210 Nicotine dependence, cigarettes, uncomplicated: Secondary | ICD-10-CM | POA: Insufficient documentation

## 2018-05-18 DIAGNOSIS — I10 Essential (primary) hypertension: Secondary | ICD-10-CM | POA: Insufficient documentation

## 2018-05-18 DIAGNOSIS — F1020 Alcohol dependence, uncomplicated: Secondary | ICD-10-CM | POA: Insufficient documentation

## 2018-05-18 LAB — RAPID URINE DRUG SCREEN, HOSP PERFORMED
Amphetamines: NOT DETECTED
Barbiturates: NOT DETECTED
Benzodiazepines: NOT DETECTED
Cocaine: NOT DETECTED
Opiates: NOT DETECTED
Tetrahydrocannabinol: NOT DETECTED

## 2018-05-18 LAB — COMPREHENSIVE METABOLIC PANEL
ALT: 101 U/L — ABNORMAL HIGH (ref 0–44)
AST: 208 U/L — ABNORMAL HIGH (ref 15–41)
Albumin: 4 g/dL (ref 3.5–5.0)
Alkaline Phosphatase: 135 U/L — ABNORMAL HIGH (ref 38–126)
Anion gap: 10 (ref 5–15)
BUN: 6 mg/dL (ref 6–20)
CALCIUM: 8.9 mg/dL (ref 8.9–10.3)
CO2: 22 mmol/L (ref 22–32)
Chloride: 105 mmol/L (ref 98–111)
Creatinine, Ser: 0.53 mg/dL (ref 0.44–1.00)
GFR calc non Af Amer: >60 mL/min (ref >60)
Glucose, Bld: 95 mg/dL (ref 70–99)
Potassium: 3.7 mmol/L (ref 3.5–5.1)
Sodium: 137 mmol/L (ref 135–145)
Total Bilirubin: 0.9 mg/dL (ref 0.3–1.2)
Total Protein: 7.9 g/dL (ref 6.5–8.1)

## 2018-05-18 LAB — TSH: TSH: 2.002 u[IU]/mL (ref 0.350–4.500)

## 2018-05-18 LAB — CBC WITH DIFFERENTIAL/PLATELET
Abs Immature Granulocytes: 0.04 10*3/uL (ref 0.00–0.07)
BASOS ABS: 0.1 10*3/uL (ref 0.0–0.1)
Basophils Relative: 1 %
EOS ABS: 0 10*3/uL (ref 0.0–0.5)
Eosinophils Relative: 1 %
HCT: 41.2 % (ref 36.0–46.0)
Hemoglobin: 14.1 g/dL (ref 12.0–15.0)
Immature Granulocytes: 1 %
Lymphocytes Relative: 22 %
Lymphs Abs: 1.2 10*3/uL (ref 0.7–4.0)
MCH: 32.5 pg (ref 26.0–34.0)
MCHC: 34.2 g/dL (ref 30.0–36.0)
MCV: 94.9 fL (ref 80.0–100.0)
Monocytes Absolute: 0.5 10*3/uL (ref 0.1–1.0)
Monocytes Relative: 9 %
NEUTROS PCT: 66 %
Neutro Abs: 3.7 10*3/uL (ref 1.7–7.7)
Platelets: 201 10*3/uL (ref 150–400)
RBC: 4.34 MIL/uL (ref 3.87–5.11)
RDW: 13.2 % (ref 11.5–15.5)
WBC: 5.5 10*3/uL (ref 4.0–10.5)
nRBC: 0 % (ref 0.0–0.2)

## 2018-05-18 LAB — D-DIMER, QUANTITATIVE: D-Dimer, Quant: 0.57 ug/mL-FEU — ABNORMAL HIGH (ref 0.00–0.50)

## 2018-05-18 LAB — I-STAT BETA HCG BLOOD, ED (MC, WL, AP ONLY): I-stat hCG, quantitative: <5 m[IU]/mL (ref <5)

## 2018-05-18 LAB — ETHANOL: Alcohol, Ethyl (B): 121 mg/dL — ABNORMAL HIGH (ref <10)

## 2018-05-18 LAB — MAGNESIUM: MAGNESIUM: 2.1 mg/dL (ref 1.7–2.4)

## 2018-05-18 MED ORDER — LACTATED RINGERS IV BOLUS
1000.0000 mL | Freq: Once | INTRAVENOUS | Status: AC
  Administered 2018-05-18: 1000 mL via INTRAVENOUS

## 2018-05-18 MED ORDER — LABETALOL HCL 100 MG PO TABS
100.0000 mg | ORAL_TABLET | Freq: Two times a day (BID) | ORAL | Status: DC
  Administered 2018-05-18: 100 mg via ORAL
  Filled 2018-05-18: qty 1

## 2018-05-18 MED ORDER — CHLORDIAZEPOXIDE HCL 25 MG PO CAPS: ORAL_CAPSULE | ORAL | 0 refills | Status: DC

## 2018-05-18 MED ORDER — IOPAMIDOL (ISOVUE-370) INJECTION 76%
INTRAVENOUS | Status: AC
  Filled 2018-05-18: qty 100

## 2018-05-18 MED ORDER — IOPAMIDOL (ISOVUE-370) INJECTION 76%
100.0000 mL | Freq: Once | INTRAVENOUS | Status: AC | PRN
  Administered 2018-05-18: 100 mL via INTRAVENOUS

## 2018-05-18 MED ORDER — SODIUM CHLORIDE (PF) 0.9 % IJ SOLN
INTRAMUSCULAR | Status: AC
  Filled 2018-05-18: qty 50

## 2018-05-18 MED ORDER — LORAZEPAM 2 MG/ML IJ SOLN
2.0000 mg | Freq: Once | INTRAMUSCULAR | Status: AC
  Administered 2018-05-18: 2 mg via INTRAVENOUS
  Filled 2018-05-18: qty 1

## 2018-05-18 MED ORDER — LORAZEPAM 2 MG/ML IJ SOLN
1.0000 mg | Freq: Once | INTRAMUSCULAR | Status: AC
  Administered 2018-05-18: 1 mg via INTRAVENOUS
  Filled 2018-05-18: qty 1

## 2018-05-18 MED ORDER — LABETALOL HCL 100 MG PO TABS: 100.0000 mg | ORAL_TABLET | Freq: Two times a day (BID) | ORAL | 0 refills | Status: DC

## 2018-05-18 MED ORDER — SERTRALINE HCL 50 MG PO TABS
25.0000 mg | ORAL_TABLET | Freq: Every day | ORAL | Status: DC
  Administered 2018-05-18: 25 mg via ORAL
  Filled 2018-05-18: qty 1

## 2018-05-18 NOTE — ED Triage Notes (Signed)
Pt needs medical clearance for detox for ETOH. Denies SI or HI.

## 2018-05-18 NOTE — Discharge Instructions (Addendum)
Take the blood pressure medicine.  Take Librium to help with withdrawal.  Follow-up with [facility] as planned.  You are medically cleared at this time.

## 2018-05-18 NOTE — ED Provider Notes (Addendum)
[facility]EMERGENCY DEPT Provider Note   CSN: [id number] Arrival date & time: 05/18/18  1155     History   Chief Complaint Chief Complaint  Patient presents with  . detox    HPI Michaell CowingMercedes Vandoren is a 33 y.o. female.  HPI  33 year old female comes in for detox. Patient has history of hypertension and anxiety.  She reports that she has been an alcoholic for the last 3 years, and consuming more than a dozen beers a day. This morning she decided to quit alcohol to do better for her daughter and herself.  Patient denies any substance abuse.  She has never gone to a rehab facility in the past.  She has no SI, HI.  Patient is noted to be tachycardic with a heart rate in the 140s. Pt has no hx of PE, DVT and denies any exogenous hormone (testosterone / estrogen) use, long distance travels or surgery in the past 6 weeks, active cancer, recent immobilization.   Past Medical History:  Diagnosis Date  . Anxiety 2013  . Hypertension 2014    Patient Active Problem List   Diagnosis Date Noted  . Anxiety, generalized 04/01/2013  . Panic attacks 04/01/2013  . Essential hypertension 05/05/2012  . Anxiety 05/06/2011    Past Surgical History:  Procedure Laterality Date  . CESAREAN SECTION  03/18/2007     OB History   No obstetric history on file.      Home Medications    Prior to Admission medications   Medication Sig Start Date End Date Taking? Authorizing Provider  labetalol (NORMODYNE) 100 MG tablet Take 1 tablet (100 mg total) by mouth 2 (two) times daily. 09/02/17  Yes Julieanne MansonMulberry, Elizabeth, MD  sertraline (ZOLOFT) 25 MG tablet 1 tab by mouth daily for 7 days, then 2 tabs by mouth once daily 02/08/18  Yes Eustace MooreNelson, Yvonne Sue, MD  metroNIDAZOLE (FLAGYL) 500 MG tablet Take 1 tablet (500 mg total) by mouth 2 (two) times daily. Patient not taking: Reported on 05/18/2018 02/11/18   Eustace MooreNelson, Yvonne Sue, MD    Family History Family History  Problem Relation Age  of Onset  . Heart murmur Father   . AAA (abdominal aortic aneurysm) Sister   . Urinary tract infection Daughter   . Hypertension Maternal Grandmother     Social History Social History   Tobacco Use  . Smoking status: Current Every Day Smoker    Packs/day: 0.50    Years: 10.00    Pack years: 5.00    Types: Cigarettes  . Smokeless tobacco: Never Used  Substance Use Topics  . Alcohol use: Yes    Alcohol/week: 10.0 standard drinks    Types: 10 Cans of beer per week  . Drug use: No     Allergies   Patient has no known allergies.   Review of Systems Review of Systems  Constitutional: Positive for activity change.  Respiratory: Negative for shortness of breath.   Cardiovascular: Negative for chest pain.  Gastrointestinal: Negative for nausea and vomiting.  Psychiatric/Behavioral: Negative for suicidal ideas.  All other systems reviewed and are negative.    Physical Exam Updated Vital Signs BP (!) 201/133   Pulse (!) 132   Temp 99.1 F (37.3 C) (Oral)   Resp (!) 24   Ht 5\' 6"  (1.676 m)   Wt 98.9 kg   LMP 05/16/2018   SpO2 100%   BMI 35.19 kg/m   Physical Exam Vitals signs and nursing note reviewed.  Constitutional:  Appearance: She is well-developed.  HENT:     Head: Normocephalic and atraumatic.  Neck:     Musculoskeletal: Normal range of motion and neck supple.  Cardiovascular:     Rate and Rhythm: Regular rhythm. Tachycardia present.  Pulmonary:     Effort: Pulmonary effort is normal.  Abdominal:     General: Bowel sounds are normal.  Skin:    General: Skin is warm and dry.  Neurological:     Mental Status: She is alert and oriented to person, place, and time.      ED Treatments / Results  Labs (all labs ordered are listed, but only abnormal results are displayed) Labs Reviewed  COMPREHENSIVE METABOLIC PANEL - Abnormal; Notable for the following components:      Result Value   AST 208 (*)    ALT 101 (*)    Alkaline Phosphatase 135 (*)     All other components within normal limits  ETHANOL - Abnormal; Notable for the following components:   Alcohol, Ethyl (B) 121 (*)    All other components within normal limits  D-DIMER, QUANTITATIVE (NOT AT [facility]) - Abnormal; Notable for the following components:   D-Dimer, Quant 0.57 (*)    All other components within normal limits  RAPID URINE DRUG SCREEN, HOSP PERFORMED  CBC WITH DIFFERENTIAL/PLATELET  MAGNESIUM  TSH  I-STAT BETA HCG BLOOD, ED ([facility], [facility], [facility] ONLY)    EKG EKG Interpretation  Date/Time:  Tuesday May 18 2018 12:30:53 EST Ventricular Rate:  144 PR Interval:    QRS Duration: 83 QT Interval:  323 QTC Calculation: 500 R Axis:   7 Text Interpretation:  Sinus or ectopic atrial tachycardia Probable left atrial enlargement Borderline low voltage, extremity leads Nonspecific T abnormalities, lateral leads Prolonged QT interval No old tracing to compare Confirmed by Derwood Kaplananavati, Zackarey Holleman [phone]([id number]) on 05/18/2018 12:39:24 PM    EKG Interpretation  Date/Time:  Tuesday May 18 2018 15:19:24 EST Ventricular Rate:  121 PR Interval:    QRS Duration: 85 QT Interval:  327 QTC Calculation: 464 R Axis:   52 Text Interpretation:  Sinus tachycardia Borderline T abnormalities, inferior leads No acute changes Confirmed by Derwood KaplanNanavati, Nikolas Casher [phone]([id number]) on 05/18/2018 4:52:50 PM        Radiology No results found.  Procedures Procedures (including critical care time)  Medications Ordered in ED Medications  lactated ringers bolus 1,000 mL (1,000 mLs Intravenous New Bag/Given 05/18/18 1630)  labetalol (NORMODYNE) tablet 100 mg (has no administration in time range)  sertraline (ZOLOFT) tablet 25 mg (has no administration in time range)  lactated ringers bolus 1,000 mL (0 mLs Intravenous Stopped 05/18/18 1516)  LORazepam (ATIVAN) injection 2 mg (2 mg Intravenous Given 05/18/18 1401)  LORazepam (ATIVAN) injection 1 mg (1 mg Intravenous Given 05/18/18 1630)     Initial Impression /  Assessment and Plan / ED Course  I have reviewed the triage vital signs and the nursing notes.  Pertinent labs & imaging results that were available during my care of the patient were reviewed by me and considered in my medical decision making (see chart for details).     Patient comes in with chief complaint of medical clearance.  Patient allegedly has secured a bed at our car. She admits to heavy alcohol use and no drug abuse.  Patient has never gone to a rehab facility before.  She has had shakes before because of alcohol withdrawal, but no other severe symptoms.  Patient's last drink was 45 minutes prior to ED arrival.  On exam she is noted to be tachycardic.  She has history of anxiety, but does not appear anxious.  We will give her Ativan and IV fluid and reassess.  Basic labs have been ordered along with TSH and UDS.  4:53 PM Patient's labs are overall reassuring.  UDS is negative.  TSH is normal. Her heart rate only improved to 120s after IV fluid and Ativan.  D-dimer was ordered which is elevated.  CT PE ordered -Dr. Rubin Payor to follow-up on the CT results.  If her heart rate continues to be elevated in the range of 130s and 140s then she will benefit from medicine consultation prior to being cleared.  Final Clinical Impressions(s) / ED Diagnoses   Final diagnoses:  Sinus tachycardia  Alcoholism [facility])    ED Discharge Orders    None         Derwood Kaplan, MD 05/18/18 1653

## 2018-05-18 NOTE — ED Notes (Signed)
Bed: [facility]4 Expected date:  Expected time:  Means of arrival:  Comments: 

## 2018-05-18 NOTE — ED Notes (Signed)
Patient transported to CT 

## 2018-05-18 NOTE — Patient Outreach (Signed)
[facility]PSS met with the patient to provide substance use recovery support and help with substance use treatment resources. Patient presents here for medical clearance for detox, so she can get connected to [facility]'s detox/residential substance use treatment program in [place], [place] provided the patient with a detox center list and CPSS contact information. CPSS strongly encouraged the patient to continue to stay in contact with [facility]PSS for substance use recovery support and help with substance use treatment resources if needed after future discharge from [facility].

## 2018-05-18 NOTE — ED Notes (Signed)
Bed: [facility]PT3 Expected date:  Expected time:  Means of arrival:  Comments: 

## 2018-05-20 DIAGNOSIS — F1020 Alcohol dependence, uncomplicated: Secondary | ICD-10-CM | POA: Diagnosis present

## 2018-05-20 HISTORY — DX: Alcohol dependence, uncomplicated: F10.20

## 2019-04-15 ENCOUNTER — Telehealth (INDEPENDENT_AMBULATORY_CARE_PROVIDER_SITE_OTHER): Payer: Self-pay | Admitting: Internal Medicine

## 2019-04-15 ENCOUNTER — Other Ambulatory Visit: Payer: Self-pay

## 2019-04-15 VITALS — BP 131/84

## 2019-04-15 DIAGNOSIS — F1011 Alcohol abuse, in remission: Secondary | ICD-10-CM

## 2019-04-15 DIAGNOSIS — N898 Other specified noninflammatory disorders of vagina: Secondary | ICD-10-CM

## 2019-04-15 DIAGNOSIS — I10 Essential (primary) hypertension: Secondary | ICD-10-CM

## 2019-04-15 DIAGNOSIS — F329 Major depressive disorder, single episode, unspecified: Secondary | ICD-10-CM

## 2019-04-15 DIAGNOSIS — F32A Depression, unspecified: Secondary | ICD-10-CM

## 2019-04-15 MED ORDER — HYDRALAZINE HCL 25 MG PO TABS: 25.0000 mg | ORAL_TABLET | Freq: Two times a day (BID) | ORAL | 4 refills | Status: DC

## 2019-04-15 NOTE — Progress Notes (Signed)
    Subjective:    Patient ID: Veronica Calhoun, female   DOB: 16-Mar-1986, 33 y.o.   MRN: [id number]   HPI   New to establish  1.  Hypertension:  Diagnosed about 11 years ago.  Started with PIH.   Has really just been treated for 7 months since she got sober.  Taking HCTZ, Hydralazine, Metoprolol, spironolactone. BP today was 131/84.   Needs to work on diet and physical activity. Needs refill of Hydralazine.  2.  Needs CPE, with pap and STI testing.  3.  History of alcohol abuse.  Went through detox and sober now for 7 months. Has not been able to get into treatment. She is open to an [place] program virtually.  4.  Chronic/recurrent vaginal discharge.  Has been diagnosed with BV recurrently in past. Last STI evaluation was in June and negative.  No pelvic pain.  Has had this for years.  5.  Depression :  Controlled with Fluoxetine.    Current Meds  Medication Sig  . FLUoxetine (PROZAC) 20 MG capsule Take 20 mg by mouth daily.  . hydrALAZINE (APRESOLINE) 25 MG tablet Take 25 mg by mouth 2 (two) times daily.  . hydrochlorothiazide (HYDRODIURIL) 25 MG tablet Take 25 mg by mouth daily.  . metoprolol tartrate (LOPRESSOR) 100 MG tablet Take 100 mg by mouth 2 (two) times daily.  Marland Kitchen spironolactone (ALDACTONE) 25 MG tablet Take 25 mg by mouth once.   Allergies  Allergen Reactions  . Lisinopril Other (See Comments)    Reports Lip swelling  . Amlodipine Other (See Comments)    "like I was going to pass out"     Review of Systems    Objective:   Blood pressure 131/84.   Physical Exam  Looks well  Assessment & Plan  1.  Hypertension:  Fair control.  CMP, CBC, FLP when in for fasting labs. Hydralazine refilled.  2.  Vaginal discharge:  Vaginal self swab when in for fasting labs in 1 week.  3.  History of Alcohol Abuse:  Encouraged to go online and find an AA virtual group.  4.  Depression:  Continue Fluoxetine. CPE next available.

## 2019-04-19 NOTE — Progress Notes (Signed)
CPE appt. Scheduled. For lab appt. Patient stated will call back to schedule due to started a new job not long ago.

## 2019-05-05 ENCOUNTER — Other Ambulatory Visit: Payer: Self-pay

## 2019-05-05 ENCOUNTER — Other Ambulatory Visit (INDEPENDENT_AMBULATORY_CARE_PROVIDER_SITE_OTHER): Payer: Self-pay

## 2019-05-05 DIAGNOSIS — I10 Essential (primary) hypertension: Secondary | ICD-10-CM

## 2019-05-05 DIAGNOSIS — Z13220 Encounter for screening for lipoid disorders: Secondary | ICD-10-CM

## 2019-05-06 LAB — CBC WITH DIFFERENTIAL/PLATELET
Basophils Absolute: 0.1 10*3/uL (ref 0.0–0.2)
Basos: 1 %
EOS (ABSOLUTE): 0.3 10*3/uL (ref 0.0–0.4)
Eos: 5 %
Hematocrit: 41.5 % (ref 34.0–46.6)
Hemoglobin: 14.3 g/dL (ref 11.1–15.9)
Immature Grans (Abs): 0 10*3/uL (ref 0.0–0.1)
Immature Granulocytes: 0 %
Lymphocytes Absolute: 2.5 10*3/uL (ref 0.7–3.1)
Lymphs: 37 %
MCH: 30.3 pg (ref 26.6–33.0)
MCHC: 34.5 g/dL (ref 31.5–35.7)
MCV: 88 fL (ref 79–97)
Monocytes Absolute: 0.7 10*3/uL (ref 0.1–0.9)
Monocytes: 10 %
Neutrophils Absolute: 3.2 10*3/uL (ref 1.4–7.0)
Neutrophils: 47 %
Platelets: 298 10*3/uL (ref 150–450)
RBC: 4.72 x10E6/uL (ref 3.77–5.28)
RDW: 12.8 % (ref 11.7–15.4)
WBC: 6.8 10*3/uL (ref 3.4–10.8)

## 2019-05-06 LAB — LIPID PANEL W/O CHOL/HDL RATIO
Cholesterol, Total: 198 mg/dL (ref 100–199)
HDL: 40 mg/dL (ref >39)
LDL Chol Calc (NIH): 143 mg/dL — ABNORMAL HIGH (ref 0–99)
Triglycerides: 83 mg/dL (ref 0–149)
VLDL Cholesterol Cal: 15 mg/dL (ref 5–40)

## 2019-05-06 LAB — COMPREHENSIVE METABOLIC PANEL
ALT: 24 IU/L (ref 0–32)
AST: 21 IU/L (ref 0–40)
Albumin/Globulin Ratio: 1.3 (ref 1.2–2.2)
Albumin: 4.1 g/dL (ref 3.8–4.8)
Alkaline Phosphatase: 83 IU/L (ref 39–117)
BUN/Creatinine Ratio: 11 (ref 9–23)
BUN: 8 mg/dL (ref 6–20)
Bilirubin Total: 0.6 mg/dL (ref 0.0–1.2)
CO2: 23 mmol/L (ref 20–29)
Calcium: 9.3 mg/dL (ref 8.7–10.2)
Chloride: 105 mmol/L (ref 96–106)
Creatinine, Ser: 0.73 mg/dL (ref 0.57–1.00)
GFR calc Af Amer: 127 mL/min/{1.73_m2} (ref >59)
GFR calc non Af Amer: 110 mL/min/{1.73_m2} (ref >59)
Globulin, Total: 3.2 g/dL (ref 1.5–4.5)
Glucose: 91 mg/dL (ref 65–99)
Potassium: 3.9 mmol/L (ref 3.5–5.2)
Sodium: 140 mmol/L (ref 134–144)
Total Protein: 7.3 g/dL (ref 6.0–8.5)

## 2019-05-16 ENCOUNTER — Encounter: Payer: Self-pay | Admitting: Internal Medicine

## 2019-05-16 ENCOUNTER — Other Ambulatory Visit: Payer: Self-pay | Admitting: Internal Medicine

## 2019-05-16 ENCOUNTER — Ambulatory Visit (INDEPENDENT_AMBULATORY_CARE_PROVIDER_SITE_OTHER): Payer: Self-pay | Admitting: Internal Medicine

## 2019-05-16 ENCOUNTER — Other Ambulatory Visit: Payer: Self-pay

## 2019-05-16 VITALS — BP 138/88 | HR 70 | Resp 12 | Ht 65.5 in | Wt 236.0 lb

## 2019-05-16 DIAGNOSIS — N898 Other specified noninflammatory disorders of vagina: Secondary | ICD-10-CM

## 2019-05-16 DIAGNOSIS — Z124 Encounter for screening for malignant neoplasm of cervix: Secondary | ICD-10-CM

## 2019-05-16 DIAGNOSIS — E785 Hyperlipidemia, unspecified: Secondary | ICD-10-CM

## 2019-05-16 DIAGNOSIS — B079 Viral wart, unspecified: Secondary | ICD-10-CM

## 2019-05-16 DIAGNOSIS — R87810 Cervical high risk human papillomavirus (HPV) DNA test positive: Secondary | ICD-10-CM

## 2019-05-16 DIAGNOSIS — R87610 Atypical squamous cells of undetermined significance on cytologic smear of cervix (ASC-US): Secondary | ICD-10-CM

## 2019-05-16 DIAGNOSIS — Z6836 Body mass index (BMI) 36.0-36.9, adult: Secondary | ICD-10-CM

## 2019-05-16 LAB — POCT URINE PREGNANCY: Preg Test, Ur: NEGATIVE

## 2019-05-16 LAB — POCT WET PREP WITH KOH
KOH Prep POC: NEGATIVE
Trichomonas, UA: NEGATIVE
Yeast Wet Prep HPF POC: NEGATIVE

## 2019-05-16 NOTE — Progress Notes (Signed)
Subjective:    Patient ID: Veronica Calhoun, female   DOB: Nov 17, 1985, 34 y.o.   MRN: [id number]   HPI   Was here for CPE, but late, so focusing on vaginal discharge complaint.  She has not has a pap it appears since 2011. Discharge for 2-3 weeks, off-white, mild odor, not fishy.  No associated itching and no pelvic pain. Periods have changed since December. She is sexually active, but not using any form of birth control. Had Mirena IUD in past, but removed in 2015 and did well with it.   She is G2P1TAB1.  Had TAB soon after Mirena was removed in 2015. She does have Hypertension and on multiple meds for that.  During exam, patient brings up something she has noted--"feels like a bone"  Just posterior to vulvar area/at midline proximal perineum.  Has been there about 3 months.   Her current female partner, with whom she is monogamous, has been with her for 4 months.  She is not aware of him having any GU complaints.  She did receive the 3 dose HPV vaccine when younger.  No history of warts on other areas of body and again, not aware of her partner having anything. Despite this, describes requiring cryosurgery for abnormal pap with [facility] back in 2009.   Discussed diet and working on improving HDL/LDL based on labs done for CPE that was to occur today.  Current Meds  Medication Sig  . FLUoxetine (PROZAC) 20 MG capsule Take 20 mg by mouth daily.  . hydrALAZINE (APRESOLINE) 25 MG tablet Take 1 tablet (25 mg total) by mouth 2 (two) times daily.  . hydrochlorothiazide (HYDRODIURIL) 25 MG tablet Take 25 mg by mouth daily.  . metoprolol tartrate (LOPRESSOR) 100 MG tablet Take 100 mg by mouth 2 (two) times daily.  Marland Kitchen spironolactone (ALDACTONE) 25 MG tablet Take 25 mg by mouth once.   Allergies  Allergen Reactions  . Lisinopril Other (See Comments)    Reports Lip swelling  . Amlodipine Other (See Comments)    "like I was going to pass out"     Review of  Systems    Objective:   BP 138/88 (BP Location: Left Arm, Patient Position: Sitting, Cuff Size: Large)   Pulse 70   Resp 12   Ht 5' 5.5" (1.664 m)   Wt 236 lb (107 kg)   LMP 04/24/2019   BMI 38.68 kg/m   Physical Exam  NAD, obese Lungs:  CTA CV:  RRR without murmur or rub.  Radial pulses normal and equal Abd: S, NT, No HSM or mass, + BS GU:  External female genitalia with 2 flat warts -70mm each at anterior/proximal perineum.  No vaginal or cervical mucosal lesion.  Scant vaginal lubrication.  No CMT.  No uterine or adnexal mass or tenderness.  Assessment & Plan  1.  Vaginal Discharge by history:  Wet prep not supportive of any particular abnormality--perhaps very mild BV--await rest of work up, including Pap, GC/Chlamydia, RPR, HIV. Urine HCG negative today  2.  Genital warts:  Do not have podophyllin or liquid nitrogen for treatment currently.  Will look into ordering for follow up CPE vs sending to gynecology once see results of pap and other work up.  3.  Cervical cancer screening:  Pap pending.  4.  Obesity and dyslipidemia:  Discussed making changes to diet and gradually increasing physical activity daily for weight loss and improvement of low HDL/higher than would like LDL.

## 2019-05-16 NOTE — Patient Instructions (Signed)
Drink a glass of water before every meal Drink 6-8 glasses of water daily Eat three meals daily Eat a protein and healthy fat with every meal (eggs,fish, chicken, [place] and limit red meats) Eat 5 servings of vegetables daily, mix the colors Eat 2 servings of fruit daily with skin, if skin is edible Use smaller plates Put food/utensils down as you chew and swallow each bite Eat at a table with friends/family at least once daily, no TV Do not eat in front of the TV  Recent studies show that people who consume all of their calories in a 12 hour period lose weight more efficiently.  For example, if you eat your first meal at 7:00 a.m., your last meal of the day should be completed by 7:00 p.m.  Consider unsweetened almond milk and 2-5% [place] yogurt for dairy

## 2019-05-17 LAB — CYTOLOGY - PAP

## 2019-05-18 LAB — GC/CHLAMYDIA PROBE AMP
Chlamydia trachomatis, NAA: NEGATIVE
Neisseria Gonorrhoeae by PCR: NEGATIVE

## 2019-05-18 LAB — RPR QUALITATIVE: RPR Ser Ql: NONREACTIVE

## 2019-05-18 LAB — HIV ANTIBODY (ROUTINE TESTING W REFLEX): HIV Screen 4th Generation wRfx: NONREACTIVE

## 2019-05-19 ENCOUNTER — Telehealth: Payer: Self-pay | Admitting: Internal Medicine

## 2019-05-19 NOTE — Telephone Encounter (Signed)
Patient called requesting lab/pap results pt. Had done on 05/16/2019.   Please advise.

## 2019-05-20 NOTE — Telephone Encounter (Signed)
Patient called inquiring on her pap smear results. Patient stated has been calling since Monday and thinks is not that difficult to get that resulted and this is taking so long. Patient stated called this morning and spoke with the nurse and was told will be receiving a call back today but stills not receiving any call back yet. Patient stated the reason why she keeps calling back is because she never receives call back from [place] to inform about her lab results.  Please advise.

## 2019-05-24 NOTE — Addendum Note (Signed)
Addended by: Marcene Duos on: 05/24/2019 04:45 PM   Modules accepted: Orders

## 2019-05-24 NOTE — Telephone Encounter (Signed)
To Dr. Delrae Alfred to call patient with pap results

## 2019-05-30 ENCOUNTER — Other Ambulatory Visit: Payer: Self-pay

## 2019-05-30 DIAGNOSIS — N898 Other specified noninflammatory disorders of vagina: Secondary | ICD-10-CM | POA: Insufficient documentation

## 2019-05-30 MED ORDER — FLUOXETINE HCL 20 MG PO CAPS: 20.0000 mg | ORAL_CAPSULE | Freq: Every day | ORAL | 5 refills | Status: DC

## 2019-05-30 MED ORDER — METOPROLOL TARTRATE 100 MG PO TABS: 100.0000 mg | ORAL_TABLET | Freq: Two times a day (BID) | ORAL | 11 refills | Status: DC

## 2019-05-30 MED ORDER — HYDROCHLOROTHIAZIDE 25 MG PO TABS: 25.0000 mg | ORAL_TABLET | Freq: Every day | ORAL | 11 refills | Status: DC

## 2019-05-30 NOTE — Telephone Encounter (Signed)
Patient notified of need for follow up --sending through [facility] --will likely need colposcopy.

## 2019-05-30 NOTE — Telephone Encounter (Signed)
noted 

## 2019-06-02 ENCOUNTER — Ambulatory Visit: Payer: HRSA Program | Attending: Internal Medicine

## 2019-06-02 DIAGNOSIS — Z20822 Contact with and (suspected) exposure to covid-19: Secondary | ICD-10-CM | POA: Insufficient documentation

## 2019-06-03 ENCOUNTER — Telehealth: Payer: Self-pay | Admitting: Internal Medicine

## 2019-06-03 ENCOUNTER — Telehealth: Payer: Self-pay | Admitting: Physician Assistant

## 2019-06-03 DIAGNOSIS — H1032 Unspecified acute conjunctivitis, left eye: Secondary | ICD-10-CM

## 2019-06-03 LAB — NOVEL CORONAVIRUS, NAA: SARS-CoV-2, NAA: NOT DETECTED

## 2019-06-03 MED ORDER — POLYMYXIN B-TRIMETHOPRIM 10000-0.1 UNIT/ML-% OP SOLN: 2.0000 [drp] | Freq: Four times a day (QID) | OPHTHALMIC | 0 refills | Status: DC

## 2019-06-03 NOTE — Progress Notes (Signed)
E-Visit for Pink Eye   We are sorry that you are not feeling well.  Here is how we plan to help!  Based on what you have shared with me it looks like you have conjunctivitis.  Conjunctivitis is a common inflammatory or infectious condition of the eye that is often referred to as "pink eye".  In most cases it is contagious (viral or bacterial). However, not all conjunctivitis requires antibiotics (ex. Allergic).  We have made appropriate suggestions for you based upon your presentation.  I have prescribed Polytrim Ophthalmic drops 1-2 drops 4 times a day times 5 days  Pink eye can be highly contagious.  It is typically spread through direct contact with secretions, or contaminated objects or surfaces that one may have touched.  Strict handwashing is suggested with soap and water is urged.  If not available, use alcohol based had sanitizer.  Avoid unnecessary touching of the eye.  If you wear contact lenses, you will need to refrain from wearing them until you see no white discharge from the eye for at least 24 hours after being on medication.  You should see symptom improvement in 1-2 days after starting the medication regimen.  Call us if symptoms are not improved in 1-2 days.  Home Care:  Wash your hands often!  Do not wear your contacts until you complete your treatment plan.  Avoid sharing towels, bed linen, personal items with a person who has pink eye.  See attention for anyone in your home with similar symptoms.  Get Help Right Away If:  Your symptoms do not improve.  You develop blurred or loss of vision.  Your symptoms worsen (increased discharge, pain or redness)  Your e-visit answers were reviewed by a board certified advanced clinical practitioner to complete your personal care plan.  Depending on the condition, your plan could have included both over the counter or prescription medications.  If there is a problem please reply  once you have received a response from your  provider.  Your safety is important to us.  If you have drug allergies check your prescription carefully.    You can use MyChart to ask questions about today's visit, request a non-urgent call back, or ask for a work or school excuse for 24 hours related to this e-Visit. If it has been greater than 24 hours you will need to follow up with your provider, or enter a new e-Visit to address those concerns.   You will get an e-mail in the next two days asking about your experience.  I hope that your e-visit has been valuable and will speed your recovery. Thank you for using e-visits.  Madysin Crisp PA-C  Approximately 5 minutes was spent documenting and reviewing patient's chart.     

## 2019-06-03 NOTE — Telephone Encounter (Signed)
Spoke with patient. States she went and had a covid test done yesterday and did a e-visit to get eye drops for her eye.

## 2019-06-03 NOTE — Telephone Encounter (Signed)
Patient called requesting a call back from Cherice to gets update on the conversation regarding eyes concern pt. Discussed with the nurse yesterday.   To Cherice to call back patient.

## 2019-06-17 ENCOUNTER — Telehealth: Payer: Self-pay

## 2019-06-17 NOTE — Telephone Encounter (Signed)
Patient informed Colposcopy appointment is 07/21/19 @ 3:30, confirmed B[facility]CP appointment for 07/14/19 @ 8:00 am.

## 2019-06-24 ENCOUNTER — Telehealth: Payer: Self-pay | Admitting: Nurse Practitioner

## 2019-06-24 DIAGNOSIS — R05 Cough: Secondary | ICD-10-CM

## 2019-06-24 DIAGNOSIS — R059 Cough, unspecified: Secondary | ICD-10-CM

## 2019-06-24 DIAGNOSIS — Z20822 Contact with and (suspected) exposure to covid-19: Secondary | ICD-10-CM

## 2019-06-24 MED ORDER — PROMETHAZINE-DM 6.25-15 MG/5ML PO SYRP: 5.0000 mL | ORAL_SOLUTION | Freq: Four times a day (QID) | ORAL | 0 refills | Status: AC | PRN

## 2019-06-24 NOTE — Progress Notes (Signed)
We are sorry that you are not feeling well.  Here is how we plan to help!  Based on your presentation I believe you most likely have A cough due to a virus.  This is called viral bronchitis and is best treated by rest, plenty of fluids and control of the cough.  You may use Ibuprofen or Tylenol as directed to help your symptoms.     I also recommend you get another COVID-19 test based on your symptoms and recent onset. Symptoms of COVID-19 include:  The following symptoms may appear 2-14 days after exposure: . Fever . Cough . Shortness of breath or difficulty breathing . Chills . Repeated shaking with chills . Muscle pain . Headache . Sore throat . New loss of taste or smell . Fatigue . Congestion or runny nose . Nausea or vomiting . Diarrhea  You can use medication such as A prescription cough medication called Phenergan DM 6.25 mg/15 mg. You make take one teaspoon / 5 ml every 4-6 hours as needed for cough. You may also take acetaminophen (Tylenol) as needed for fever.  We are enrolling you in our MyChart Home Monitoring for COVID19 . Daily you will receive a questionnaire within the MyChart website. Our COVID 19 response team will be monitoring your responses daily.  Testing Information: The COVID-19 Community Testing sites will begin testing BY APPOINTMENT ONLY.  You can schedule online at [email or web address]  If you do not have access to a smart phone or computer you may call [phone] for an appointment.   Additional testing sites in the Community:  . For [facility] Testing sites in [facility]  [email or web address].[email or web address]  . For Pop-up testing sites in [place]  [email or web address]  . For Testing sites with regular hours [email or web address]  . For [facility] [email or web address]  . For [facility] [email or web address]  . For [facility] testing in [place] and [employer] [email or web address]  . For [facility] testing in [facility]   [email or web address]  For  more information about community testing call [phone]   Please quarantine yourself while awaiting your test results. Please stay home for a minimum of 10 days from the first day of illness with improving symptoms and you have had 24 hours of no fever (without the use of Tylenol (Acetaminophen) Motrin (Ibuprofen) or any fever reducing medication).  Also - Do not get tested prior to returning to work because once you have had a positive test the test can stay positive for more then a month in some cases.   You should wear a mask or cloth face covering over your nose and mouth if you must be around other people or animals, including pets (even at home). Try to stay at least 6 feet away from other people. This will protect the people around you.  Please continue good preventive care measures, including:  frequent hand-washing, avoid touching your face, cover coughs/sneezes, stay out of crowds and keep a 6 foot distance from others.  COVID-19 is a respiratory illness with symptoms that are similar to the flu. Symptoms are typically mild to moderate, but there have been cases of severe illness and death due to the virus.   Reduce your risk of any infection by using the same precautions used for avoiding the common cold or flu:  Marland Kitchen Wash your hands often with soap and warm water for at least 20 seconds.  If soap and water are not readily available, use an alcohol-based hand sanitizer with at  least 60% alcohol.  . If coughing or sneezing, cover your mouth and nose by coughing or sneezing into the elbow areas of your shirt or  coat, into a tissue or into your sleeve (not your hands). . Avoid shaking hands with others and consider head nods or verbal greetings only. . Avoid touching your eyes, nose, or mouth with unwashed hands.  . Avoid close contact with people who are sick. . Avoid places or events with large numbers of people in one location, like concerts or sporting events. . Carefully consider travel plans you have or are making. . If you are planning any travel outside or inside the [place], visit the CDC's Travelers' Health webpage for the latest health notices. . If you have some symptoms but not all symptoms, continue to monitor at home and seek medical attention if your symptoms worsen. . If you are having a medical emergency, call 911.  HOME CARE . Only take medications as instructed by your medical team. . Drink plenty of fluids and get plenty of rest. . A steam or ultrasonic humidifier can help if you have congestion.  . Only take medications as instructed by your medical team.  . Complete the entire course of an antibiotic. . Drink plenty of fluids and get plenty of rest.  . Avoid close contacts especially the very young and the elderly. . Cover your mouth if you cough or cough into your sleeve. . Always remember to wash your hands . A steam or ultrasonic humidifier can help congestion.   GET HELP RIGHT AWAY IF: . You develop worsening fever. . You become short of breath . You cough up blood. . Your symptoms persist after you have completed your treatment plan MAKE SURE YOU   Understand these instructions.  Will watch your condition.  Will get help right away if you are not doing well or get worse.  Your e-visit answers were reviewed by a board certified advanced clinical practitioner to complete your personal care plan.  Depending on the condition, your plan could have included both over the counter or prescription medications. If there is a problem please reply  once you have received a  response from your provider. Your safety is important to [place].  If you have drug allergies check your prescription carefully.    You can use MyChart to ask questions about today's visit, request a non-urgent call back, or ask for a work or school excuse for 24 hours related to this e-Visit. If it has been greater than 24 hours you will need to follow up with your provider, or enter a new e-Visit to address those concerns. You will get an e-mail in the next two days asking about your experience.  I hope that your e-visit has been valuable and will speed your recovery. Thank you for using e-visits.  I have spent at least 5 minutes reviewing and documenting in the patient's chart.

## 2019-06-27 ENCOUNTER — Other Ambulatory Visit: Payer: Self-pay

## 2019-06-27 ENCOUNTER — Telehealth: Payer: Self-pay | Admitting: Internal Medicine

## 2019-06-27 NOTE — Telephone Encounter (Signed)
Patient called requesting Rx on spironolactone (ALDACTONE) 25 MG tablet to be called in at [facility] on [address].  Please advise.

## 2019-06-30 ENCOUNTER — Other Ambulatory Visit: Payer: Self-pay

## 2019-06-30 MED ORDER — SPIRONOLACTONE 25 MG PO TABS: 25.0000 mg | ORAL_TABLET | Freq: Once | ORAL | 0 refills | Status: DC

## 2019-06-30 NOTE — Telephone Encounter (Signed)
Rx sent to pharmacy   

## 2019-07-08 ENCOUNTER — Telehealth: Payer: Self-pay | Admitting: Nurse Practitioner

## 2019-07-08 DIAGNOSIS — N390 Urinary tract infection, site not specified: Secondary | ICD-10-CM

## 2019-07-08 MED ORDER — CEPHALEXIN 500 MG PO CAPS: 500.0000 mg | ORAL_CAPSULE | Freq: Two times a day (BID) | ORAL | 0 refills | Status: AC

## 2019-07-08 MED ORDER — PHENAZOPYRIDINE HCL 100 MG PO TABS: 100.0000 mg | ORAL_TABLET | Freq: Three times a day (TID) | ORAL | 0 refills | Status: AC | PRN

## 2019-07-08 NOTE — Progress Notes (Signed)
We are sorry that you are not feeling well.  Here is how we plan to help!  Based on what you shared with me it looks like you most likely have a simple urinary tract infection.  A UTI (Urinary Tract Infection) is a bacterial infection of the bladder.  Most cases of urinary tract infections are simple to treat but a key part of your care is to encourage you to drink plenty of fluids and watch your symptoms carefully.  I have prescribed Keflex 500 mg twice a day for 7 days. I am also prescribing Pyridium 100mg  tablets up to three times daily for 3 days for your symptoms.  Your symptoms should gradually improve. Call if the burning in your urine worsens, you develop worsening fever, back pain or pelvic pain or if your symptoms do not resolve after completing the antibiotic.  Urinary tract infections can be prevented by drinking plenty of water to keep your body hydrated.  Also be sure when you wipe, wipe from front to back and don't hold it in!  If possible, empty your bladder every 4 hours.  Your e-visit answers were reviewed by a board certified advanced clinical practitioner to complete your personal care plan.  Depending on the condition, your plan could have included both over the counter or prescription medications.  If there is a problem please reply  once you have received a response from your provider.  Your safety is important to [place].  If you have drug allergies check your prescription carefully.    You can use MyChart to ask questions about today's visit, request a non-urgent call back, or ask for a work or school excuse for 24 hours related to this e-Visit. If it has been greater than 24 hours you will need to follow up with your provider, or enter a new e-Visit to address those concerns.   You will get an e-mail in the next two days asking about your experience.  I hope that your e-visit has been valuable and will speed your recovery. Thank you for using e-visits.  I have spent at  least 5 minutes reviewing and documenting in the patient's chart.

## 2019-07-14 ENCOUNTER — Ambulatory Visit: Payer: Self-pay | Admitting: Student

## 2019-07-14 ENCOUNTER — Other Ambulatory Visit: Payer: Self-pay

## 2019-07-14 VITALS — BP 158/98 | Temp 97.1°F | Wt 246.0 lb

## 2019-07-14 DIAGNOSIS — Z1239 Encounter for other screening for malignant neoplasm of breast: Secondary | ICD-10-CM

## 2019-07-14 NOTE — Progress Notes (Signed)
Ms. Veronica Calhoun is a 34 y.o. female who presents to [facility] clinic today with complaint of small acne under her breasts.    Pap Smear: Pap not smear completed today. Last Pap smear was [facility] on 05/16/2019 and was abnormal - with positive HPV and ASCUS. Per patient has no history of an abnormal Pap smear. Last Pap smear result is available in Epic.   Physical exam: Breasts Breasts symmetrical. No skin abnormalities bilateral breasts. No nipple retraction bilateral breasts. No nipple discharge bilateral breasts. No lymphadenopathy. No lumps palpated bilateral breasts.  Small pimples under the right and left breast.    Pelvic/Bimanual Pap is not indicated today    Smoking History: Patient has never smokede.    Patient Navigation: Patient education provided. Access to services provided for patient through [facility] program. No  transportation provided   Colorectal Cancer Screening: Per patient has a colpo scheduled for 07/21/2019 No complaints today.    Breast and Cervical Cancer Risk Assessment: Patient does not have family history of breast cancer, known genetic mutations, or radiation treatment to the chest before age 74. Patient has history of cervical dysplasia, immunocompromised, or DES exposure in-utero.  Risk Assessment    Risk Scores      07/14/2019   Last edited by: Narda Rutherford, LPN   5-year risk:    Lifetime risk:           A: BCCCP exam without pap smear Complaint of acne on breasts  P: Referred patient to the [facility] for colposcopy scheduled for 07-21-2019 Try changing clothing and body wash; follow up with derm or PCP.   Marylene Land, CNM 07/14/2019 8:45 AM

## 2019-07-19 ENCOUNTER — Encounter: Payer: Self-pay | Admitting: Internal Medicine

## 2019-07-21 ENCOUNTER — Other Ambulatory Visit (HOSPITAL_COMMUNITY)
Admission: RE | Admit: 2019-07-21 | Discharge: 2019-07-21 | Disposition: A | Discharge status: Home / Self Care | Payer: Self-pay | Source: Ambulatory Visit | Attending: Obstetrics and Gynecology | Admitting: Obstetrics and Gynecology

## 2019-07-21 ENCOUNTER — Ambulatory Visit (INDEPENDENT_AMBULATORY_CARE_PROVIDER_SITE_OTHER): Payer: Self-pay | Admitting: Obstetrics and Gynecology

## 2019-07-21 ENCOUNTER — Other Ambulatory Visit: Payer: Self-pay

## 2019-07-21 ENCOUNTER — Encounter: Payer: Self-pay | Admitting: Obstetrics and Gynecology

## 2019-07-21 DIAGNOSIS — R87810 Cervical high risk human papillomavirus (HPV) DNA test positive: Secondary | ICD-10-CM

## 2019-07-21 DIAGNOSIS — R87610 Atypical squamous cells of undetermined significance on cytologic smear of cervix (ASC-US): Secondary | ICD-10-CM | POA: Insufficient documentation

## 2019-07-21 DIAGNOSIS — A630 Anogenital (venereal) warts: Secondary | ICD-10-CM | POA: Insufficient documentation

## 2019-07-21 HISTORY — DX: Anogenital (venereal) warts: A63.0

## 2019-07-21 MED ORDER — IMIQUIMOD 5 % EX CREA: TOPICAL_CREAM | CUTANEOUS | 5 refills | Status: DC

## 2019-07-21 NOTE — Progress Notes (Signed)
    GYNECOLOGY CLINIC COLPOSCOPY PROCEDURE NOTE  34 y.o. No obstetric history on file. here for colposcopy for ASCUS with POSITIVE high risk HPV pap smear on Jan 2021. Discussed role for HPV in cervical dysplasia, need for surveillance. H/O abnormal pap smear at age 66, had cryosurgery. Also c/o 2 small vaginal bumps.  Patient given informed consent, signed copy in the chart, time out was performed.  Placed in lithotomy position. Cervix viewed with speculum and colposcope after application of acetic acid.   Colposcopy adequate? Yes  no visible lesions; Bx obtained at 6 & 12 o'clock position.   ECC specimen obtained. Monsel's applied All specimens were labelled and sent to pathology.  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  Will follow up pathology and manage accordingly.  Routine preventative health maintenance measures emphasized. Rx for Aldara cream provided to pt. U/R/B reviewed with pt.  Nettie Elm, MD, FACOG Attending Obstetrician & Gynecologist [facility], [facility]

## 2019-07-21 NOTE — Patient Instructions (Signed)
Colposcopy, Care After This sheet gives you information about how to care for yourself after your procedure. Your doctor may also give you more specific instructions. If you have problems or questions, contact your doctor. What can I expect after the procedure? If you did not have a tissue sample removed (did not have a biopsy), you may only have some spotting for a few days. You can go back to your normal activities. If you had a tissue sample removed, it is common to have:  Soreness and pain. This may last for a few days.  Light-headedness.  Mild bleeding from your vagina or dark-colored, grainy discharge from your vagina. This may last for a few days. You may need to wear a sanitary pad.  Spotting for at least 48 hours after the procedure. Follow these instructions at home:   Take over-the-counter and prescription medicines only as told by your doctor. Ask your doctor what medicines you can start taking again. This is very important if you take blood-thinning medicine.  Do not drive or use heavy machinery while taking prescription pain medicine.  For 3 days, or as long as your doctor tells you, avoid: ? Douching. ? Using tampons. ? Having sex.  If you use birth control (contraception), keep using it.  Limit activity for the first day after the procedure. Ask your doctor what activities are safe for you.  It is up to you to get the results of your procedure. Ask your doctor when your results will be ready.  Keep all follow-up visits as told by your doctor. This is important. Contact a doctor if:  You get a skin rash. Get help right away if:  You are bleeding a lot from your vagina. It is a lot of bleeding if you are using more than one pad an hour for 2 hours in a row.  You have clumps of blood (blood clots) coming from your vagina.  You have a fever.  You have chills  You have pain in your lower belly (pelvic area).  You have signs of infection, such as vaginal  discharge that is: ? Different than usual. ? Yellow. ? Bad-smelling.  You have very pain or cramps in your lower belly that do not get better with medicine.  You feel light-headed.  You feel dizzy.  You pass out (faint). Summary  If you did not have a tissue sample removed (did not have a biopsy), you may only have some spotting for a few days. You can go back to your normal activities.  If you had a tissue sample removed, it is common to have mild pain and spotting for 48 hours.  For 3 days, or as long as your doctor tells you, avoid douching, using tampons and having sex.  Get help right away if you have bleeding, very bad pain, or signs of infection. This information is not intended to replace advice given to you by your health care provider. Make sure you discuss any questions you have with your health care provider. Document Revised: 04/03/2017 Document Reviewed: 01/09/2016 Elsevier Patient Education  2020 Elsevier Inc.  

## 2019-07-25 ENCOUNTER — Telehealth: Payer: Self-pay | Admitting: *Deleted

## 2019-07-25 DIAGNOSIS — A630 Anogenital (venereal) warts: Secondary | ICD-10-CM

## 2019-07-25 LAB — SURGICAL PATHOLOGY

## 2019-07-25 MED ORDER — IMIQUIMOD 5 % EX CREA: TOPICAL_CREAM | CUTANEOUS | 5 refills | Status: DC

## 2019-07-25 NOTE — Telephone Encounter (Addendum)
Pt left VM message stating that she was seen in office last week and received Rx for a cream. She does not have insurance and the cream costs $600 which she cannot afford. She is requesting alternate Rx to be sent to pharmacy.   1130  After consult w/Dr. Adrian Blackwater, pt was called and informed that there are no alternate medications which can be prescribed for her problem. I advised that I will check into some other options and call her back. Pt agreed.   1700  Called pt and informed her that she can get a coupon for the medication from [email or web address].[email or web address]/. Her cost @ [facility] would be $30.68. Pt stated she can afford that and requested Rx to be sent to [facility] on [facility] [address]. Rx sent as requested. Pt asked about the colposcopy results because she has reviewed them and does not understand. I stated that the result is showing abnormal cells which are not cancer. Once Dr. Alysia Penna has reviewed the result, we will call her or send MyChart message with his recommendations for follow up. Pt voiced understanding of all information and instructions given.

## 2019-08-16 ENCOUNTER — Other Ambulatory Visit: Payer: Self-pay | Admitting: Internal Medicine

## 2019-08-17 ENCOUNTER — Encounter: Payer: Self-pay | Admitting: Internal Medicine

## 2019-08-22 ENCOUNTER — Telehealth: Payer: Self-pay | Admitting: Emergency Medicine

## 2019-08-22 DIAGNOSIS — R300 Dysuria: Secondary | ICD-10-CM

## 2019-08-22 MED ORDER — CEPHALEXIN 500 MG PO CAPS: 500.0000 mg | ORAL_CAPSULE | Freq: Two times a day (BID) | ORAL | 0 refills | Status: AC

## 2019-08-22 NOTE — Progress Notes (Signed)

## 2019-08-24 ENCOUNTER — Encounter: Payer: Self-pay | Admitting: *Deleted

## 2019-09-04 ENCOUNTER — Telehealth: Payer: Self-pay | Admitting: Family

## 2019-09-04 DIAGNOSIS — N760 Acute vaginitis: Secondary | ICD-10-CM

## 2019-09-04 MED ORDER — FLUCONAZOLE 150 MG PO TABS
150.0000 mg | ORAL_TABLET | Freq: Once | ORAL | 0 refills | Status: AC
Start: 2019-09-04 — End: 2019-09-04

## 2019-09-04 NOTE — Progress Notes (Signed)

## 2019-09-12 ENCOUNTER — Ambulatory Visit: Payer: Self-pay | Admitting: Obstetrics and Gynecology

## 2019-09-19 ENCOUNTER — Other Ambulatory Visit: Payer: Self-pay | Admitting: Internal Medicine

## 2019-09-20 ENCOUNTER — Ambulatory Visit: Payer: Self-pay | Admitting: Obstetrics and Gynecology

## 2019-09-20 ENCOUNTER — Encounter: Payer: Self-pay | Admitting: Internal Medicine

## 2019-09-20 ENCOUNTER — Encounter: Payer: Self-pay | Admitting: Student

## 2019-09-23 NOTE — Telephone Encounter (Signed)
Left message for patient. Will discuss further at OV on 10/12/19.

## 2019-10-12 ENCOUNTER — Encounter: Payer: Self-pay | Admitting: Internal Medicine

## 2019-10-16 ENCOUNTER — Ambulatory Visit (HOSPITAL_COMMUNITY)
Admission: RE | Admit: 2019-10-16 | Discharge: 2019-10-16 | Disposition: A | Discharge status: Home / Self Care | Payer: Self-pay | Source: Ambulatory Visit | Attending: Family Medicine | Admitting: Family Medicine

## 2019-10-16 ENCOUNTER — Other Ambulatory Visit: Payer: Self-pay

## 2019-10-16 ENCOUNTER — Encounter (HOSPITAL_COMMUNITY): Payer: Self-pay

## 2019-10-16 VITALS — BP 159/109 | HR 87 | Temp 98.5°F | Resp 18

## 2019-10-16 DIAGNOSIS — Z3201 Encounter for pregnancy test, result positive: Secondary | ICD-10-CM

## 2019-10-16 LAB — POCT URINALYSIS DIP (DEVICE)
Bilirubin Urine: NEGATIVE
Glucose, UA: NEGATIVE mg/dL
Hgb urine dipstick: NEGATIVE
Ketones, ur: NEGATIVE mg/dL
Leukocytes,Ua: NEGATIVE
Nitrite: NEGATIVE
Protein, ur: NEGATIVE mg/dL
Specific Gravity, Urine: 1.02 (ref 1.005–1.030)
Urobilinogen, UA: 0.2 mg/dL (ref 0.0–1.0)
pH: 6 (ref 5.0–8.0)

## 2019-10-16 LAB — POC URINE PREG, ED: Preg Test, Ur: POSITIVE — AB

## 2019-10-16 MED ORDER — SPIRONOLACTONE 25 MG PO TABS: 25.0000 mg | ORAL_TABLET | Freq: Every day | ORAL | 0 refills | Status: DC

## 2019-10-16 NOTE — ED Triage Notes (Signed)
Pt presents with mild abdominal cramping and possible pregnancy since last night.

## 2019-10-16 NOTE — Discharge Instructions (Signed)
Call the OB/GYN listed on your discharge instructions tomorrow to set up an appointment. [facility] at [facility] Their number is [phone] I refilled the spironolactone but there is a warning against this in pregnancy.  Follow up as needed for continued or worsening symptoms

## 2019-10-18 NOTE — ED Provider Notes (Signed)
[facility]    CSN: [id number] Arrival date & time: 10/16/19  1444      History   Chief Complaint Chief Complaint  Patient presents with  . Possible Pregnancy  . Abdominal Pain    HPI Veronica Calhoun is a 34 y.o. female.   Patient is a 34 year old female who presents today with mild abdominal cramping since last night.  Symptoms have been coming and going.  She is concerned for possible pregnancy.  No vaginal bleeding or discharge.  Has had some mild nausea without vomiting, diarrhea or fevers.  No dysuria, hematuria urinary frequency. Patient's last menstrual period was 10/09/2019.  ROS per HPI      Past Medical History:  Diagnosis Date  . Anxiety 2013  . Hypertension 2014    Patient Active Problem List   Diagnosis Date Noted  . ASCUS with positive high risk HPV cervical 07/21/2019  . Warts, genital 07/21/2019  . Depression 04/15/2019  . History of alcohol abuse 04/15/2019  . Anxiety, generalized 04/01/2013  . Panic attacks 04/01/2013  . Essential hypertension 05/05/2012  . Anxiety 05/06/2011    Past Surgical History:  Procedure Laterality Date  . CESAREAN SECTION  03/18/2007    OB History   No obstetric history on file.      Home Medications    Prior to Admission medications   Medication Sig Start Date End Date Taking? Authorizing Provider  FLUoxetine (PROZAC) 20 MG capsule Take 1 capsule (20 mg total) by mouth daily. 05/30/19   Julieanne Manson, MD  hydrALAZINE (APRESOLINE) 25 MG tablet Take 1 tablet (25 mg total) by mouth 2 (two) times daily. 04/15/19   Julieanne Manson, MD  hydrochlorothiazide (HYDRODIURIL) 25 MG tablet Take 1 tablet (25 mg total) by mouth daily. 05/30/19   Julieanne Manson, MD  imiquimod Mathis Dad) 5 % cream Apply topically 3 (three) times a week. Apply until total clearance or maximum of 16 weeks 07/25/19   Hermina Staggers, MD  metoprolol tartrate (LOPRESSOR) 100 MG tablet Take 1 tablet (100 mg total) by mouth 2  (two) times daily. 05/30/19   Julieanne Manson, MD  spironolactone (ALDACTONE) 25 MG tablet Take 1 tablet (25 mg total) by mouth daily. 10/16/19   Dahlia Byes A, NP  trimethoprim-polymyxin b (POLYTRIM) ophthalmic solution Place 2 drops into both eyes every 6 (six) hours. Patient not taking: Reported on 07/21/2019 06/03/19   Remus Loffler, PA-C    Family History Family History  Problem Relation Age of Onset  . Heart murmur Father   . AAA (abdominal aortic aneurysm) Sister   . Urinary tract infection Daughter   . Hypertension Maternal Grandmother     Social History Social History   Tobacco Use  . Smoking status: Current Every Day Smoker    Packs/day: 0.50    Years: 10.00    Pack years: 5.00    Types: Cigarettes  . Smokeless tobacco: Never Used  Vaping Use  . Vaping Use: Never used  Substance Use Topics  . Alcohol use: Not Currently    Alcohol/week: 10.0 standard drinks    Types: 10 Cans of beer per week    Comment: Sober x 10 months  . Drug use: No     Allergies   Lisinopril and Amlodipine   Review of Systems Review of Systems   Physical Exam Triage Vital Signs ED Triage Vitals  Enc Vitals Group     BP 10/16/19 1505 (!) 159/109     Pulse Rate 10/16/19 1505 87  Resp 10/16/19 1505 18     Temp 10/16/19 1505 98.5 F (36.9 C)     Temp Source 10/16/19 1505 Oral     SpO2 10/16/19 1505 99 %     Weight --      Height --      Head Circumference --      Peak Flow --      Pain Score 10/16/19 1507 5     Pain Loc --      Pain Edu? --      Excl. in [place]? --    No data found.  Updated Vital Signs BP (!) 159/109 (BP Location: Right Arm)   Pulse 87   Temp 98.5 F (36.9 C) (Oral)   Resp 18   LMP 10/09/2019   SpO2 99%   Visual Acuity Right Eye Distance:   Left Eye Distance:   Bilateral Distance:    Right Eye Near:   Left Eye Near:    Bilateral Near:     Physical Exam Vitals and nursing note reviewed.  Constitutional:      General: She is not in acute  distress.    Appearance: Normal appearance. She is not ill-appearing, toxic-appearing or diaphoretic.  HENT:     Head: Normocephalic.     Nose: Nose normal.  Eyes:     Conjunctiva/sclera: Conjunctivae normal.  Pulmonary:     Effort: Pulmonary effort is normal.  Musculoskeletal:        General: Normal range of motion.     Cervical back: Normal range of motion.  Skin:    General: Skin is warm and dry.     Findings: No rash.  Neurological:     Mental Status: She is alert.  Psychiatric:        Mood and Affect: Mood normal.      UC Treatments / Results  Labs (all labs ordered are listed, but only abnormal results are displayed) Labs Reviewed  POC URINE PREG, ED - Abnormal; Notable for the following components:      Result Value   Preg Test, Ur POSITIVE (*)    All other components within normal limits  POC URINE PREG, ED  POCT URINALYSIS DIP (DEVICE)    EKG   Radiology No results found.  Procedures Procedures (including critical care time)  Medications Ordered in UC Medications - No data to display  Initial Impression / Assessment and Plan / UC Course  I have reviewed the triage vital signs and the nursing notes.  Pertinent labs & imaging results that were available during my care of the patient were reviewed by me and considered in my medical decision making (see chart for details).  Clinical Course as of Oct 18 951  Sun Oct 16, 2019  1537 Preg Test, Ur(!): POSITIVE [TB]    Clinical Course User Index [TB] Loura Halt A, NP    Pregnancy test positive. Urine without infection.  Patient is not currently having any pain or vaginal bleeding. Recommended follow-up with OB/GYN for further management Refill spironolactone but warned against this being advised not to take during pregnancy. Final Clinical Impressions(s) / UC Diagnoses   Final diagnoses:  Positive pregnancy test     Discharge Instructions     Call the OB/GYN listed on your discharge  instructions tomorrow to set up an appointment. [facility] Their number is [phone] I refilled the spironolactone but there is a warning against this in pregnancy.  Follow up as needed for  continued or worsening symptoms     ED Prescriptions    Medication Sig Dispense Auth. Provider   spironolactone (ALDACTONE) 25 MG tablet Take 1 tablet (25 mg total) by mouth daily. 30 tablet Dahlia Byes A, NP     PDMP not reviewed this encounter.   Janace Aris, NP 10/18/19 [phone]

## 2019-11-23 ENCOUNTER — Encounter: Payer: Self-pay | Admitting: Obstetrics & Gynecology

## 2019-11-23 ENCOUNTER — Ambulatory Visit: Payer: Self-pay | Admitting: Obstetrics & Gynecology

## 2020-01-04 ENCOUNTER — Other Ambulatory Visit: Payer: Self-pay

## 2020-01-04 DIAGNOSIS — Z20822 Contact with and (suspected) exposure to covid-19: Secondary | ICD-10-CM

## 2020-01-05 LAB — NOVEL CORONAVIRUS, NAA: SARS-CoV-2, NAA: NOT DETECTED

## 2020-05-13 ENCOUNTER — Telehealth: Payer: Self-pay | Admitting: Family

## 2020-05-13 DIAGNOSIS — J069 Acute upper respiratory infection, unspecified: Secondary | ICD-10-CM

## 2020-05-13 MED ORDER — BENZONATATE 100 MG PO CAPS: 100.0000 mg | ORAL_CAPSULE | Freq: Three times a day (TID) | ORAL | 0 refills | Status: DC | PRN

## 2020-05-13 MED ORDER — FLUTICASONE PROPIONATE 50 MCG/ACT NA SUSP: 2.0000 | Freq: Every day | NASAL | 6 refills | Status: DC

## 2020-05-13 NOTE — Progress Notes (Signed)

## 2020-06-21 IMAGING — CT CT ANGIO CHEST
2 of 6 series · 19 of 46 positions shown · IV contrast (ISOVUE)
Comparison: None.

CLINICAL DATA: Cough. Elevated D-dimer. The patient reports no
chest pain or shortness of breath. Smoker.

EXAM:
CT ANGIOGRAPHY CHEST WITH CONTRAST
TECHNIQUE: Multidetector CT imaging of the chest was performed using the
standard protocol during bolus administration of intravenous
contrast. Multiplanar CT image reconstructions and MIPs were
obtained to evaluate the vascular anatomy.
CONTRAST:  100mL [id number] IOPAMIDOL ([id number]-RAZ) INJECTION 76%

[Series 5: thins · axial · 0.73mm/px · z∈[-226,+50]mm · 16 of 305 slices shown]
[im 14/305  lung]
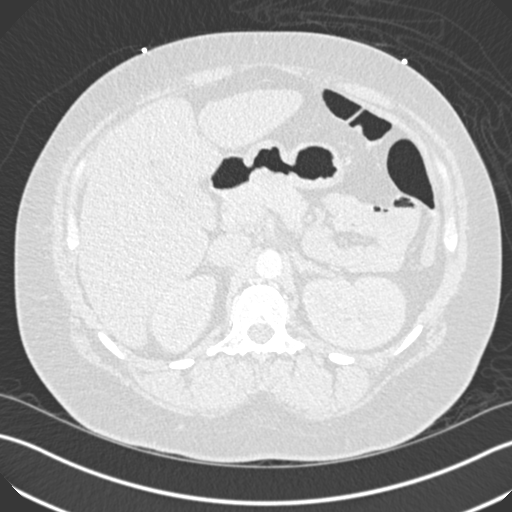
[im 40/305  soft-tissue]
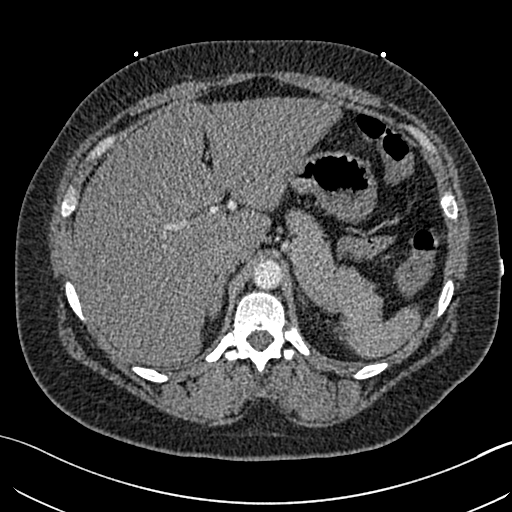
[im 53/305  lung]
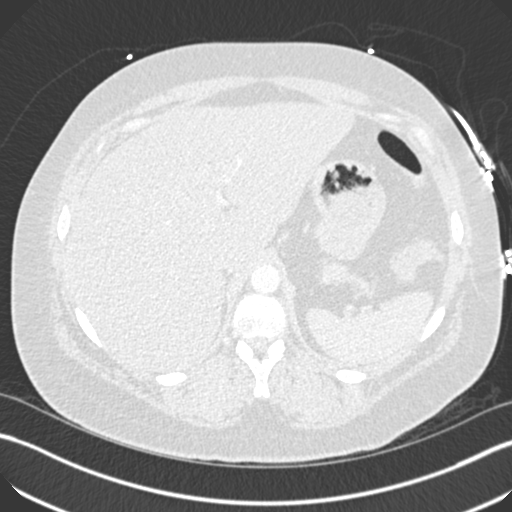
[im 67/305  soft-tissue]
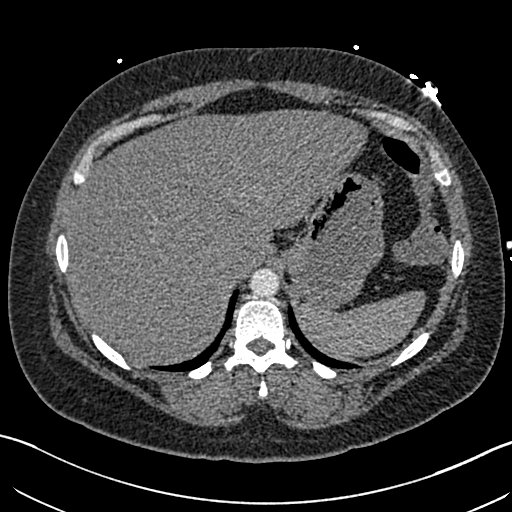
[im 93/305  lung]
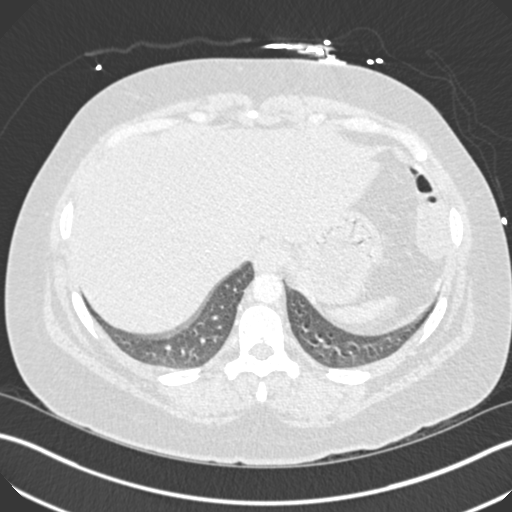
[im 106/305  soft-tissue]
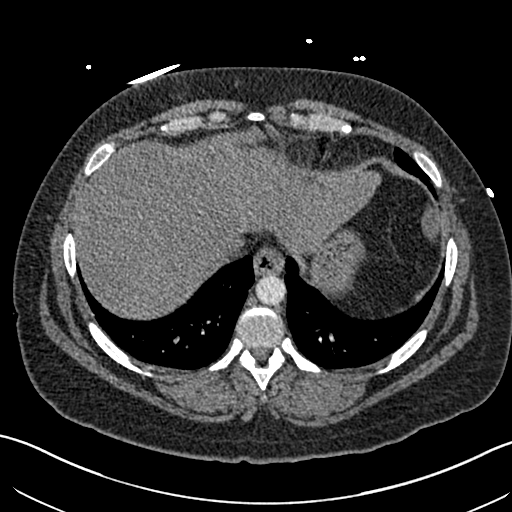
[im 119/305  lung]
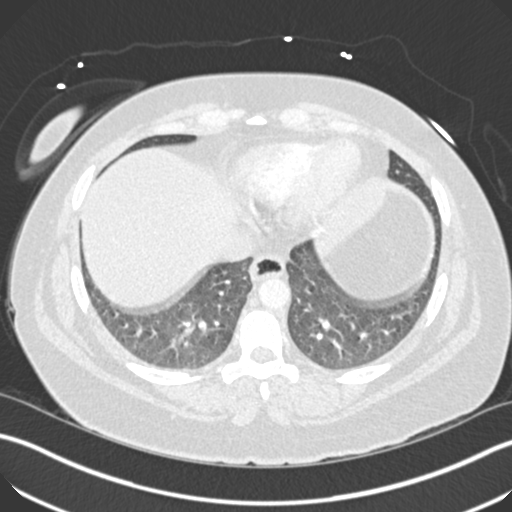
[im 146/305  soft-tissue]
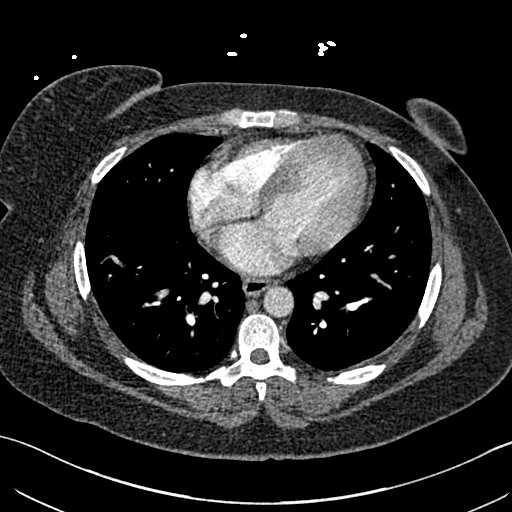
[im 159/305  lung]
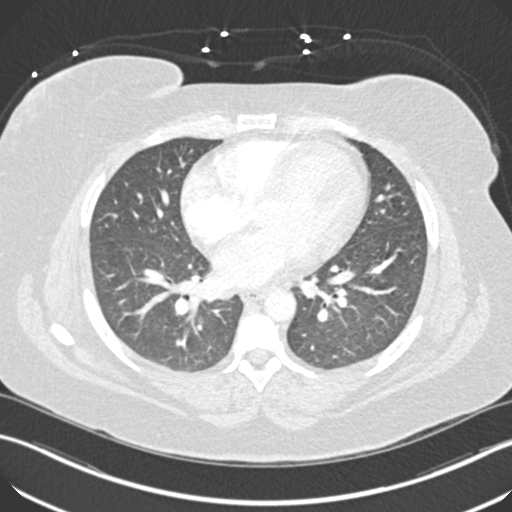
[im 186/305  soft-tissue]
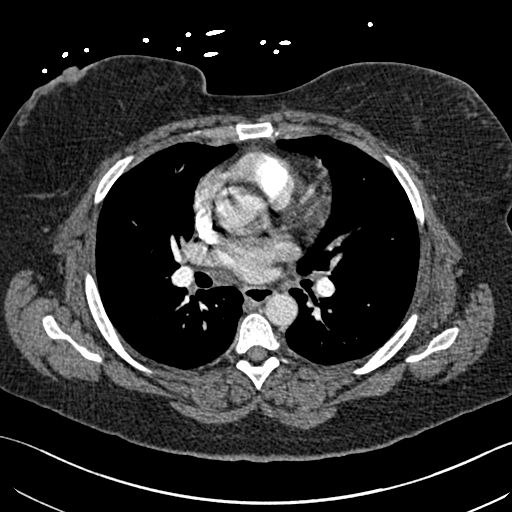
[im 199/305  lung]
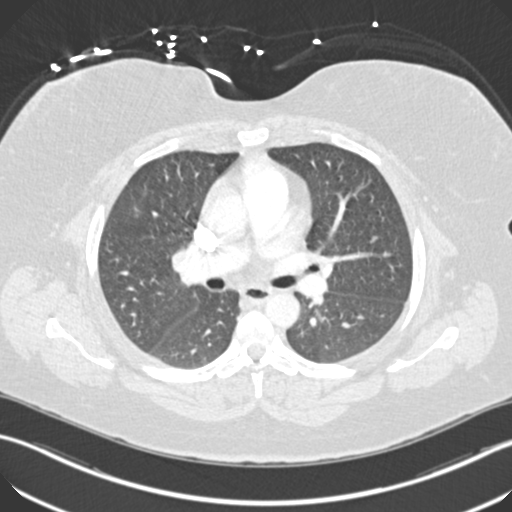
[im 212/305  soft-tissue]
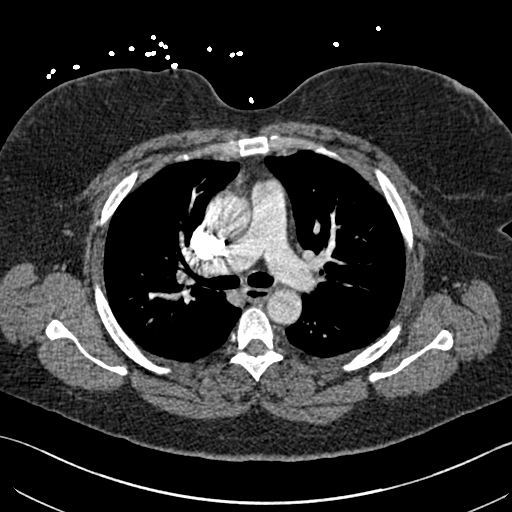
[im 238/305  lung]
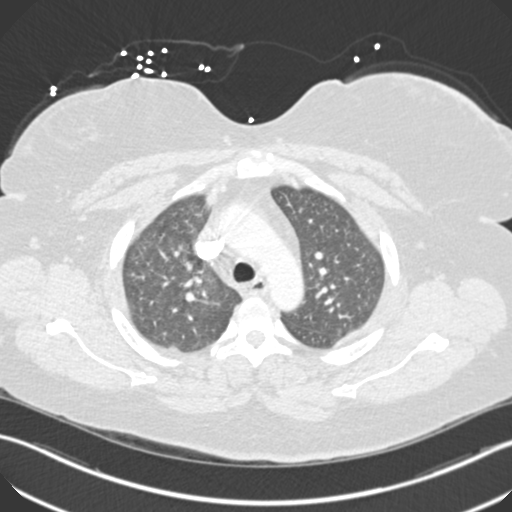
[im 252/305  soft-tissue]
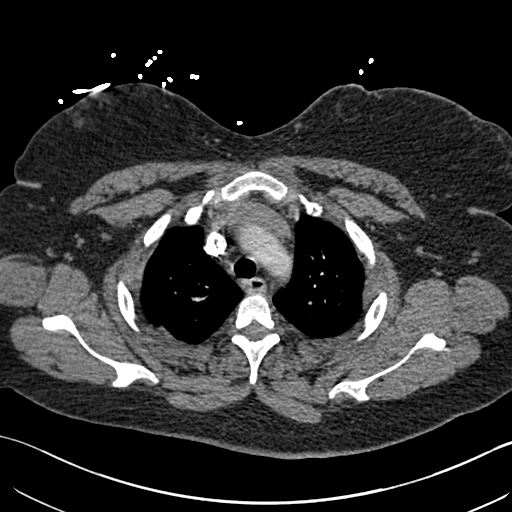
[im 265/305  lung]
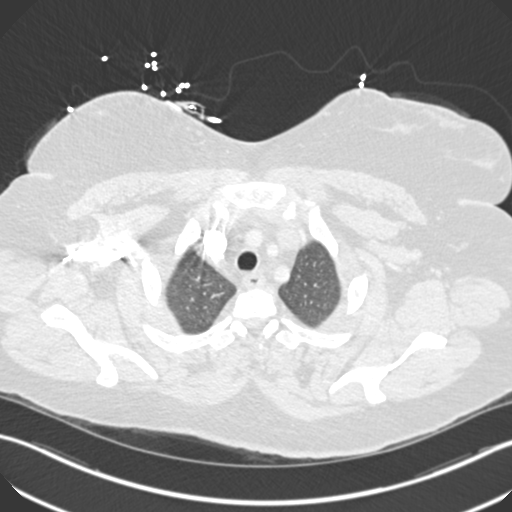
[im 291/305  soft-tissue]
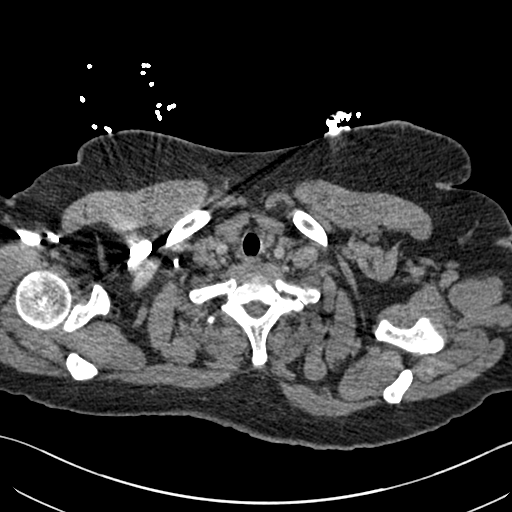

[Series 6: coronal mpr · coronal · 0.62mm/px · 3 of 151 slices shown]
[im 38/151  soft-tissue]
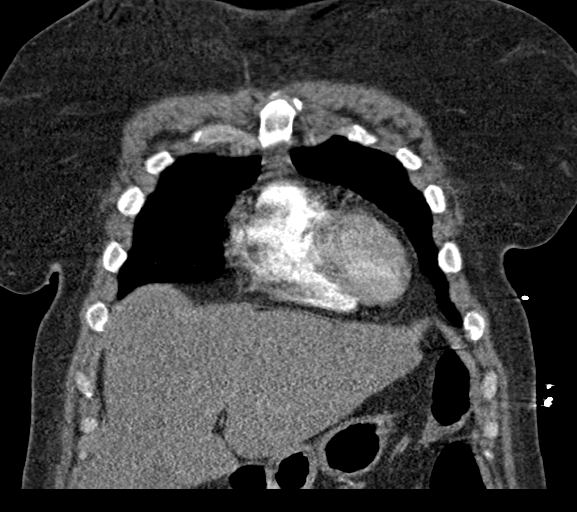
[im 76/151  soft-tissue]
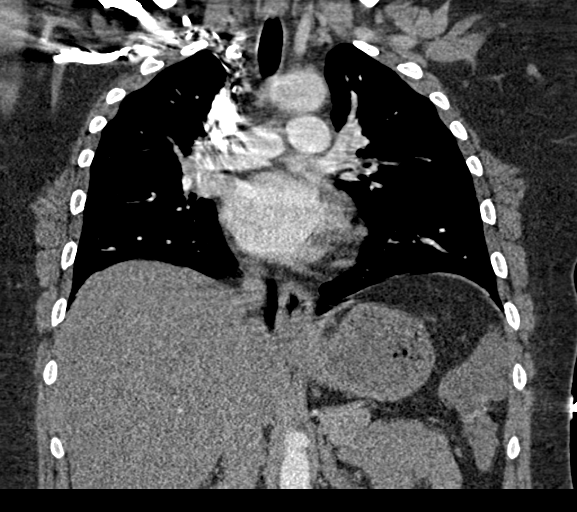
[im 113/151  soft-tissue]
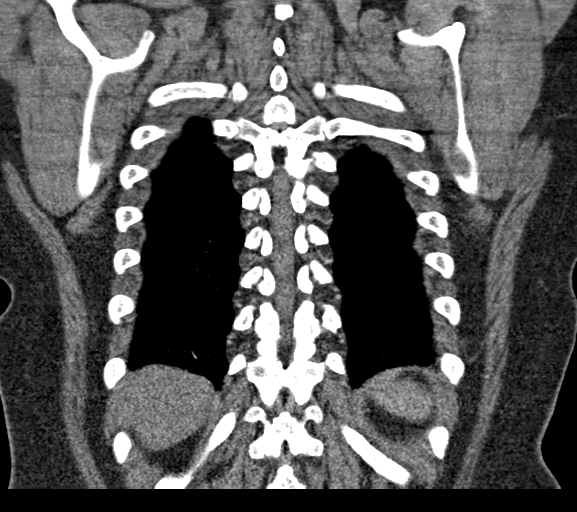

[19 of 46 positions shown; findings below may reference images not displayed]

FINDINGS: Cardiovascular: Satisfactory opacification of the pulmonary arteries
to the segmental level. No evidence of pulmonary embolism. Normal
heart size. No pericardial effusion.

Mediastinum/Nodes: No enlarged mediastinal, hilar, or axillary lymph
nodes. Thyroid gland, trachea, and esophagus demonstrate no
significant findings.

Lungs/Pleura: Mild diffuse peribronchial thickening and accentuation
of the interstitial markings. Minimal bullous changes. No airspace
consolidation or pleural fluid.

Upper Abdomen: Mild diffuse low density of the liver relative to the
spleen.

Musculoskeletal: Normal appearing bones.

Review of the MIP images confirms the above findings.
IMPRESSION: 1. No pulmonary emboli.
2. Mild changes of COPD and chronic bronchitis.
3. Mild diffuse hepatic steatosis.

## 2021-01-06 ENCOUNTER — Telehealth: Payer: Self-pay | Admitting: Family

## 2021-01-06 DIAGNOSIS — R399 Unspecified symptoms and signs involving the genitourinary system: Secondary | ICD-10-CM

## 2021-01-06 DIAGNOSIS — N898 Other specified noninflammatory disorders of vagina: Secondary | ICD-10-CM

## 2021-01-06 NOTE — Progress Notes (Signed)
Given patient is having UTI symptoms and discharge, I recommend being seen in person.   Veronica Rodney, FNP   Based on what you shared with me, I feel your condition warrants further evaluation and I recommend that you be seen in a face to face visit.   NOTE: There will be NO CHARGE for this eVisit   If you are having a true medical emergency please call 911.      For an urgent face to face visit, [facility] has six urgent care centers for your convenience:     [facility] at [facility] Directions [phone] [address] [place], [place] [address]    [facility]) Get Driving Directions [phone] [address] [place], [place] [address]  [facility]) Get Driving Directions [phone] [address] [place],  [place]  [address]  [facility] at [facility] Get Driving Directions [phone] [address], [address] [place], [place] [address]   [facility] at [facility] Get Driving Directions  [phone] [address] [place], [place] [address]   [facility] at [facility] Directions [phone] [address], [address] [place], [place] [address]  Your MyChart E-visit questionnaire answers were reviewed by a board certified advanced clinical practitioner to complete your personal care plan based on your specific symptoms.  Thank you for using e-Visits.

## 2021-01-07 ENCOUNTER — Telehealth: Payer: Self-pay | Admitting: Emergency Medicine

## 2021-01-07 NOTE — Progress Notes (Signed)
Pt is requesting refills on her blood pressure medicines.  She is not sure what they are.  She ran out of them approximately 6 months ago.  She has not seen her PCP in over a year and a half.  She is in no acute distress, no acute symptoms.  I suggested she follow-up with her primary care provider tomorrow when their office opens.

## 2021-01-10 ENCOUNTER — Encounter: Payer: Self-pay | Admitting: Internal Medicine

## 2021-01-10 ENCOUNTER — Telehealth: Payer: Medicaid Other | Admitting: Physician Assistant

## 2021-01-10 DIAGNOSIS — R3989 Other symptoms and signs involving the genitourinary system: Secondary | ICD-10-CM

## 2021-01-10 MED ORDER — CEPHALEXIN 500 MG PO CAPS: 500.0000 mg | ORAL_CAPSULE | Freq: Two times a day (BID) | ORAL | 0 refills | Status: DC

## 2021-01-10 NOTE — Progress Notes (Signed)

## 2021-02-20 ENCOUNTER — Telehealth: Payer: Medicaid Other | Admitting: Nurse Practitioner

## 2021-02-20 DIAGNOSIS — B9689 Other specified bacterial agents as the cause of diseases classified elsewhere: Secondary | ICD-10-CM | POA: Diagnosis not present

## 2021-02-20 DIAGNOSIS — N760 Acute vaginitis: Secondary | ICD-10-CM | POA: Diagnosis not present

## 2021-02-20 MED ORDER — METRONIDAZOLE 500 MG PO TABS: 500.0000 mg | ORAL_TABLET | Freq: Two times a day (BID) | ORAL | 0 refills | Status: DC

## 2021-02-20 MED ORDER — CLINDAMYCIN PHOSPHATE 2 % VA CREA: 1.0000 | TOPICAL_CREAM | Freq: Every day | VAGINAL | 0 refills | Status: DC

## 2021-02-20 NOTE — Addendum Note (Signed)
Addended by: Waldon Merl on: 02/20/2021 01:03 PM   Modules accepted: Orders

## 2021-02-20 NOTE — Addendum Note (Signed)
Addended by: Waldon Merl on: 02/20/2021 01:05 PM   Modules accepted: Orders

## 2021-02-20 NOTE — Progress Notes (Signed)
E-Visit for Vaginal Symptoms ? ?We are sorry that you are not feeling well. Here is how we plan to help! ?Based on what you shared with me it looks like you: May have a vaginosis due to bacteria ? ?Vaginosis is an inflammation of the vagina that can result in discharge, itching and pain. The cause is usually a change in the normal balance of vaginal bacteria or an infection. Vaginosis can also result from reduced estrogen levels after menopause. ? ?The most common causes of vaginosis are: ? ? Bacterial vaginosis which results from an overgrowth of one on several organisms that are normally present in your vagina. ? ? Yeast infections which are caused by a naturally occurring fungus called candida. ? ? Vaginal atrophy (atrophic vaginosis) which results from the thinning of the vagina from reduced estrogen levels after menopause. ? ? Trichomoniasis which is caused by a parasite and is commonly transmitted by sexual intercourse. ? ?Factors that increase your risk of developing vaginosis include: ?Medications, such as antibiotics and steroids ?Uncontrolled diabetes ?Use of hygiene products such as bubble bath, vaginal spray or vaginal deodorant ?Douching ?Wearing damp or tight-fitting clothing ?Using an intrauterine device (IUD) for birth control ?Hormonal changes, such as those associated with pregnancy, birth control pills or menopause ?Sexual activity ?Having a sexually transmitted infection ? ?Your treatment plan is Metronidazole or Flagyl 500mg twice a day for 7 days.  I have electronically sent this prescription into the pharmacy that you have chosen. ? ?Be sure to take all of the medication as directed. Stop taking any medication if you develop a rash, tongue swelling or shortness of breath. Mothers who are breast feeding should consider pumping and discarding their breast milk while on these antibiotics. However, there is no consensus that infant exposure at these doses would be harmful.  ?Remember that  medication creams can weaken latex condoms. ?. ? ? ?HOME CARE: ? ?Good hygiene may prevent some types of vaginosis from recurring and may relieve some symptoms: ? ?Avoid baths, hot tubs and whirlpool spas. Rinse soap from your outer genital area after a shower, and dry the area well to prevent irritation. Don't use scented or harsh soaps, such as those with deodorant or antibacterial action. ?Avoid irritants. These include scented tampons and pads. ?Wipe from front to back after using the toilet. Doing so avoids spreading fecal bacteria to your vagina. ? ?Other things that may help prevent vaginosis include: ? ?Don't douche. Your vagina doesn't require cleansing other than normal bathing. Repetitive douching disrupts the normal organisms that reside in the vagina and can actually increase your risk of vaginal infection. Douching won't clear up a vaginal infection. ?Use a latex condom. Both female and female latex condoms may help you avoid infections spread by sexual contact. ?Wear cotton underwear. Also wear pantyhose with a cotton crotch. If you feel comfortable without it, skip wearing underwear to bed. Yeast thrives in moist environments ?Your symptoms should improve in the next day or two. ? ?GET HELP RIGHT AWAY IF: ? ?You have pain in your lower abdomen ( pelvic area or over your ovaries) ?You develop nausea or vomiting ?You develop a fever ?Your discharge changes or worsens ?You have persistent pain with intercourse ?You develop shortness of breath, a rapid pulse, or you faint. ? ?These symptoms could be signs of problems or infections that need to be evaluated by a medical provider now. ? ?MAKE SURE YOU  ? ?Understand these instructions. ?Will watch your condition. ?Will get help right   away if you are not doing well or get worse. ? ?Thank you for choosing an e-visit. ? ?Your e-visit answers were reviewed by a board certified advanced clinical practitioner to complete your personal care plan. Depending upon the  condition, your plan could have included both over the counter or prescription medications. ? ?Please review your pharmacy choice. Make sure the pharmacy is open so you can pick up prescription now. If there is a problem, you may contact your provider through MyChart messaging and have the prescription routed to another pharmacy.  Your safety is important to us. If you have drug allergies check your prescription carefully.  ? ?For the next 24 hours you can use MyChart to ask questions about today's visit, request a non-urgent call back, or ask for a work or school excuse. ?You will get an email in the next two days asking about your experience. I hope that your e-visit has been valuable and will speed your recovery. ? ?5-10 minutes spent reviewing and documenting in chart. ? ?

## 2021-03-05 ENCOUNTER — Telehealth: Payer: Medicaid Other | Admitting: Physician Assistant

## 2021-03-05 DIAGNOSIS — B379 Candidiasis, unspecified: Secondary | ICD-10-CM

## 2021-03-05 MED ORDER — FLUCONAZOLE 150 MG PO TABS: 150.0000 mg | ORAL_TABLET | Freq: Once | ORAL | 0 refills | Status: AC

## 2021-03-05 NOTE — Progress Notes (Signed)

## 2021-03-05 NOTE — Progress Notes (Signed)
I have spent 5 minutes in review of e-visit questionnaire, review and updating patient chart, medical decision making and response to patient.   Etsuko Dierolf Cody Ahmia Colford, PA-C    

## 2021-03-24 ENCOUNTER — Telehealth: Payer: Medicaid Other | Admitting: Emergency Medicine

## 2021-03-24 DIAGNOSIS — J208 Acute bronchitis due to other specified organisms: Secondary | ICD-10-CM | POA: Diagnosis not present

## 2021-03-24 DIAGNOSIS — R051 Acute cough: Secondary | ICD-10-CM

## 2021-03-24 MED ORDER — BENZONATATE 100 MG PO CAPS: 100.0000 mg | ORAL_CAPSULE | Freq: Two times a day (BID) | ORAL | 0 refills | Status: DC | PRN

## 2021-03-24 MED ORDER — PREDNISONE 10 MG (21) PO TBPK: ORAL_TABLET | Freq: Every day | ORAL | 0 refills | Status: DC

## 2021-03-24 NOTE — Progress Notes (Signed)
We are sorry that you are not feeling well.  Here is how we plan to help!  Based on your presentation I believe you most likely have A cough due to a virus.  This is called viral bronchitis and is best treated by rest, plenty of fluids and control of the cough.  You may use Ibuprofen or Tylenol as directed to help your symptoms.     In addition you may use A prescription cough medication called Tessalon Perles 100mg . You may take 1-2 capsules every 8 hours as needed for your cough.  Prednisone 10 mg daily for 6 days (see taper instructions below)  From your responses in the eVisit questionnaire you describe inflammation in the upper respiratory tract which is causing a significant cough.  This is commonly called Bronchitis and has four common causes:   Allergies Viral Infections Acid Reflux Bacterial Infection Allergies, viruses and acid reflux are treated by controlling symptoms or eliminating the cause. An example might be a cough caused by taking certain blood pressure medications. You stop the cough by changing the medication. Another example might be a cough caused by acid reflux. Controlling the reflux helps control the cough.  USE OF BRONCHODILATOR ("RESCUE") INHALERS: There is a risk from using your bronchodilator too frequently.  The risk is that over-reliance on a medication which only relaxes the muscles surrounding the breathing tubes can reduce the effectiveness of medications prescribed to reduce swelling and congestion of the tubes themselves.  Although you feel brief relief from the bronchodilator inhaler, your asthma may actually be worsening with the tubes becoming more swollen and filled with mucus.  This can delay other crucial treatments, such as oral steroid medications. If you need to use a bronchodilator inhaler daily, several times per day, you should discuss this with your provider.  There are probably better treatments that could be used to keep your asthma under control.      HOME CARE Only take medications as instructed by your medical team. Complete the entire course of an antibiotic. Drink plenty of fluids and get plenty of rest. Avoid close contacts especially the very young and the elderly Cover your mouth if you cough or cough into your sleeve. Always remember to wash your hands A steam or ultrasonic humidifier can help congestion.   GET HELP RIGHT AWAY IF: You develop worsening fever. You become short of breath You cough up blood. Your symptoms persist after you have completed your treatment plan MAKE SURE YOU  Understand these instructions. Will watch your condition. Will get help right away if you are not doing well or get worse.    Thank you for choosing an e-visit.  Your e-visit answers were reviewed by a board certified advanced clinical practitioner to complete your personal care plan. Depending upon the condition, your plan could have included both over the counter or prescription medications.  Please review your pharmacy choice. Make sure the pharmacy is open so you can pick up prescription now. If there is a problem, you may contact your provider through and have the prescription routed to another pharmacy.  Your safety is important to [employer]. If you have drug allergies check your prescription carefully.   For the next 24 hours you can use MyChart to ask questions about today's visit, request a non-urgent call back, or ask for a work or school excuse. You will get an email in the next two days asking about your experience. I hope that your e-visit has been  valuable and will speed your recovery.

## 2021-03-24 NOTE — Progress Notes (Signed)
I have spent 5 minutes in review of e-visit questionnaire, review and updating patient chart, medical decision making and response to patient.   Kylan Veach, PA-C    

## 2021-03-25 ENCOUNTER — Telehealth: Payer: Medicaid Other | Admitting: Physician Assistant

## 2021-03-25 DIAGNOSIS — J208 Acute bronchitis due to other specified organisms: Secondary | ICD-10-CM

## 2021-03-25 MED ORDER — PSEUDOEPH-BROMPHEN-DM 30-2-10 MG/5ML PO SYRP: 5.0000 mL | ORAL_SOLUTION | Freq: Four times a day (QID) | ORAL | 0 refills | Status: DC | PRN

## 2021-03-25 NOTE — Progress Notes (Signed)
We are sorry that you are not feeling well.  Here is how we plan to help!  Based on your presentation I believe you most likely have A cough due to a virus.  This is called viral bronchitis and is best treated by rest, plenty of fluids and control of the cough.  You may use Ibuprofen or Tylenol as directed to help your symptoms.     In addition you may use Bromfed DM cough syrup. May take with tessalon perles.  From your responses in the eVisit questionnaire you describe inflammation in the upper respiratory tract which is causing a significant cough.  This is commonly called Bronchitis and has four common causes:   Allergies Viral Infections Acid Reflux Bacterial Infection Allergies, viruses and acid reflux are treated by controlling symptoms or eliminating the cause. An example might be a cough caused by taking certain blood pressure medications. You stop the cough by changing the medication. Another example might be a cough caused by acid reflux. Controlling the reflux helps control the cough.  USE OF BRONCHODILATOR ("RESCUE") INHALERS: There is a risk from using your bronchodilator too frequently.  The risk is that over-reliance on a medication which only relaxes the muscles surrounding the breathing tubes can reduce the effectiveness of medications prescribed to reduce swelling and congestion of the tubes themselves.  Although you feel brief relief from the bronchodilator inhaler, your asthma may actually be worsening with the tubes becoming more swollen and filled with mucus.  This can delay other crucial treatments, such as oral steroid medications. If you need to use a bronchodilator inhaler daily, several times per day, you should discuss this with your provider.  There are probably better treatments that could be used to keep your asthma under control.     HOME CARE Only take medications as instructed by your medical team. Complete the entire course of an antibiotic. Drink plenty of  fluids and get plenty of rest. Avoid close contacts especially the very young and the elderly Cover your mouth if you cough or cough into your sleeve. Always remember to wash your hands A steam or ultrasonic humidifier can help congestion.   GET HELP RIGHT AWAY IF: You develop worsening fever. You become short of breath You cough up blood. Your symptoms persist after you have completed your treatment plan MAKE SURE YOU  Understand these instructions. Will watch your condition. Will get help right away if you are not doing well or get worse.    Thank you for choosing an e-visit.  Your e-visit answers were reviewed by a board certified advanced clinical practitioner to complete your personal care plan. Depending upon the condition, your plan could have included both over the counter or prescription medications.  Please review your pharmacy choice. Make sure the pharmacy is open so you can pick up prescription now. If there is a problem, you may contact your provider through [employer] and have the prescription routed to another pharmacy.  Your safety is important to [place]. If you have drug allergies check your prescription carefully.   For the next 24 hours you can use MyChart to ask questions about today's visit, request a non-urgent call back, or ask for a work or school excuse. You will get an email in the next two days asking about your experience. I hope that your e-visit has been valuable and will speed your recovery.  I provided 5 minutes of non face-to-face time during this encounter for chart review and documentation.

## 2021-06-04 ENCOUNTER — Telehealth: Payer: Self-pay

## 2021-06-04 NOTE — Telephone Encounter (Signed)
Patient would like to be seen for emergency appt for refills on bp medications, routine STD test and pap smear.

## 2021-06-18 ENCOUNTER — Telehealth: Payer: Medicaid Other | Admitting: Physician Assistant

## 2021-06-18 DIAGNOSIS — J069 Acute upper respiratory infection, unspecified: Secondary | ICD-10-CM | POA: Diagnosis not present

## 2021-06-18 MED ORDER — BENZONATATE 100 MG PO CAPS: 100.0000 mg | ORAL_CAPSULE | Freq: Three times a day (TID) | ORAL | 0 refills | Status: DC | PRN

## 2021-06-18 NOTE — Progress Notes (Signed)
I have spent 5 minutes in review of e-visit questionnaire, review and updating patient chart, medical decision making and response to patient.   Kameshia Madruga Cody Janecia Palau, PA-C    

## 2021-06-18 NOTE — Progress Notes (Signed)
We are sorry that you are not feeling well.  Here is how we plan to help! ° °Based on your presentation I believe you most likely have A cough due to a virus.  This is called viral bronchitis and is best treated by rest, plenty of fluids and control of the cough.  You may use Ibuprofen or Tylenol as directed to help your symptoms.   °  °In addition you may use A prescription cough medication called Tessalon Perles 100mg. You may take 1-2 capsules every 8 hours as needed for your cough. ° ° °From your responses in the eVisit questionnaire you describe inflammation in the upper respiratory tract which is causing a significant cough.  This is commonly called Bronchitis and has four common causes:   °Allergies °Viral Infections °Acid Reflux °Bacterial Infection °Allergies, viruses and acid reflux are treated by controlling symptoms or eliminating the cause. An example might be a cough caused by taking certain blood pressure medications. You stop the cough by changing the medication. Another example might be a cough caused by acid reflux. Controlling the reflux helps control the cough. ° °USE OF BRONCHODILATOR ("RESCUE") INHALERS: °There is a risk from using your bronchodilator too frequently.  The risk is that over-reliance on a medication which only relaxes the muscles surrounding the breathing tubes can reduce the effectiveness of medications prescribed to reduce swelling and congestion of the tubes themselves.  Although you feel brief relief from the bronchodilator inhaler, your asthma may actually be worsening with the tubes becoming more swollen and filled with mucus.  This can delay other crucial treatments, such as oral steroid medications. If you need to use a bronchodilator inhaler daily, several times per day, you should discuss this with your provider.  There are probably better treatments that could be used to keep your asthma under control.  °   °HOME CARE °Only take medications as instructed by your  medical team. °Complete the entire course of an antibiotic. °Drink plenty of fluids and get plenty of rest. °Avoid close contacts especially the very young and the elderly °Cover your mouth if you cough or cough into your sleeve. °Always remember to wash your hands °A steam or ultrasonic humidifier can help congestion.  ° °GET HELP RIGHT AWAY IF: °You develop worsening fever. °You become short of breath °You cough up blood. °Your symptoms persist after you have completed your treatment plan °MAKE SURE YOU  °Understand these instructions. °Will watch your condition. °Will get help right away if you are not doing well or get worse. °  ° °Thank you for choosing an e-visit. ° °Your e-visit answers were reviewed by a board certified advanced clinical practitioner to complete your personal care plan. Depending upon the condition, your plan could have included both over the counter or prescription medications. ° °Please review your pharmacy choice. Make sure the pharmacy is open so you can pick up prescription now. If there is a problem, you may contact your provider through MyChart messaging and have the prescription routed to another pharmacy.  Your safety is important to us. If you have drug allergies check your prescription carefully.  ° °For the next 24 hours you can use MyChart to ask questions about today's visit, request a non-urgent call back, or ask for a work or school excuse. °You will get an email in the next two days asking about your experience. I hope that your e-visit has been valuable and will speed your recovery. ° °

## 2021-07-11 DIAGNOSIS — I10 Essential (primary) hypertension: Secondary | ICD-10-CM | POA: Insufficient documentation

## 2021-07-11 DIAGNOSIS — F17200 Nicotine dependence, unspecified, uncomplicated: Secondary | ICD-10-CM | POA: Insufficient documentation

## 2021-07-11 DIAGNOSIS — Z8742 Personal history of other diseases of the female genital tract: Secondary | ICD-10-CM | POA: Insufficient documentation

## 2021-07-16 ENCOUNTER — Encounter: Payer: Self-pay | Admitting: Family Medicine

## 2021-07-16 ENCOUNTER — Telehealth: Payer: Medicaid Other | Admitting: Family Medicine

## 2021-07-16 DIAGNOSIS — J069 Acute upper respiratory infection, unspecified: Secondary | ICD-10-CM

## 2021-07-16 MED ORDER — BENZONATATE 100 MG PO CAPS: 100.0000 mg | ORAL_CAPSULE | Freq: Two times a day (BID) | ORAL | 0 refills | Status: DC | PRN

## 2021-07-16 NOTE — Progress Notes (Signed)
We are sorry that you are not feeling well.  Here is how we plan to help!  Based on your presentation I believe you most likely have A cough due to a virus.  This is called viral bronchitis and is best treated by rest, plenty of fluids and control of the cough.  You may use Ibuprofen or Tylenol as directed to help your symptoms.     In addition you may use A non-prescription cough medication called Robitussin DAC. Take 2 teaspoons every 8 hours or Delsym: take 2 teaspoons every 12 hours. and A prescription cough medication called Tessalon Perles 100mg. You may take 1-2 capsules every 8 hours as needed for your cough.    From your responses in the eVisit questionnaire you describe inflammation in the upper respiratory tract which is causing a significant cough.  This is commonly called Bronchitis and has four common causes:   Allergies Viral Infections Acid Reflux Bacterial Infection Allergies, viruses and acid reflux are treated by controlling symptoms or eliminating the cause. An example might be a cough caused by taking certain blood pressure medications. You stop the cough by changing the medication. Another example might be a cough caused by acid reflux. Controlling the reflux helps control the cough.  USE OF BRONCHODILATOR ("RESCUE") INHALERS: There is a risk from using your bronchodilator too frequently.  The risk is that over-reliance on a medication which only relaxes the muscles surrounding the breathing tubes can reduce the effectiveness of medications prescribed to reduce swelling and congestion of the tubes themselves.  Although you feel brief relief from the bronchodilator inhaler, your asthma may actually be worsening with the tubes becoming more swollen and filled with mucus.  This can delay other crucial treatments, such as oral steroid medications. If you need to use a bronchodilator inhaler daily, several times per day, you should discuss this with your provider.  There are probably  better treatments that could be used to keep your asthma under control.     HOME CARE Only take medications as instructed by your medical team. Complete the entire course of an antibiotic. Drink plenty of fluids and get plenty of rest. Avoid close contacts especially the very young and the elderly Cover your mouth if you cough or cough into your sleeve. Always remember to wash your hands A steam or ultrasonic humidifier can help congestion.   GET HELP RIGHT AWAY IF: You develop worsening fever. You become short of breath You cough up blood. Your symptoms persist after you have completed your treatment plan MAKE SURE YOU  Understand these instructions. Will watch your condition. Will get help right away if you are not doing well or get worse.    Thank you for choosing an e-visit.  Your e-visit answers were reviewed by a board certified advanced clinical practitioner to complete your personal care plan. Depending upon the condition, your plan could have included both over the counter or prescription medications.  Please review your pharmacy choice. Make sure the pharmacy is open so you can pick up prescription now. If there is a problem, you may contact your provider through MyChart messaging and have the prescription routed to another pharmacy.  Your safety is important to us. If you have drug allergies check your prescription carefully.   For the next 24 hours you can use MyChart to ask questions about today's visit, request a non-urgent call back, or ask for a work or school excuse. You will get an email in the next two   days asking about your experience. I hope that your e-visit has been valuable and will speed your recovery.  I provided 5 minutes of non face-to-face time during this encounter for chart review, medication and order placement, as well as and documentation.   

## 2021-07-18 ENCOUNTER — Encounter: Payer: Self-pay | Admitting: Physician Assistant

## 2021-07-19 ENCOUNTER — Emergency Department (HOSPITAL_COMMUNITY)
Admission: EM | Admit: 2021-07-19 | Discharge: 2021-07-20 | Disposition: A | Discharge status: Home / Self Care | Payer: Medicaid Other | Attending: Student | Admitting: Student

## 2021-07-19 ENCOUNTER — Other Ambulatory Visit: Payer: Self-pay

## 2021-07-19 DIAGNOSIS — F1011 Alcohol abuse, in remission: Secondary | ICD-10-CM | POA: Diagnosis present

## 2021-07-19 DIAGNOSIS — R000 Tachycardia, unspecified: Secondary | ICD-10-CM | POA: Insufficient documentation

## 2021-07-19 DIAGNOSIS — Z79899 Other long term (current) drug therapy: Secondary | ICD-10-CM | POA: Diagnosis not present

## 2021-07-19 DIAGNOSIS — I10 Essential (primary) hypertension: Secondary | ICD-10-CM | POA: Diagnosis not present

## 2021-07-19 DIAGNOSIS — F10239 Alcohol dependence with withdrawal, unspecified: Secondary | ICD-10-CM | POA: Diagnosis present

## 2021-07-19 DIAGNOSIS — F10939 Alcohol use, unspecified with withdrawal, unspecified: Secondary | ICD-10-CM

## 2021-07-19 LAB — COMPREHENSIVE METABOLIC PANEL
ALT: 162 U/L — ABNORMAL HIGH (ref 0–44)
AST: 246 U/L — ABNORMAL HIGH (ref 15–41)
Albumin: 3.8 g/dL (ref 3.5–5.0)
Alkaline Phosphatase: 131 U/L — ABNORMAL HIGH (ref 38–126)
Anion gap: 10 (ref 5–15)
BUN: <5 mg/dL — ABNORMAL LOW (ref 6–20)
CO2: 24 mmol/L (ref 22–32)
Calcium: 9.1 mg/dL (ref 8.9–10.3)
Chloride: 101 mmol/L (ref 98–111)
Creatinine, Ser: 0.59 mg/dL (ref 0.44–1.00)
GFR, Estimated: >60 mL/min (ref >60)
Glucose, Bld: 114 mg/dL — ABNORMAL HIGH (ref 70–99)
Potassium: 3.7 mmol/L (ref 3.5–5.1)
Sodium: 135 mmol/L (ref 135–145)
Total Bilirubin: 1.1 mg/dL (ref 0.3–1.2)
Total Protein: 7.9 g/dL (ref 6.5–8.1)

## 2021-07-19 LAB — CBC WITH DIFFERENTIAL/PLATELET
Abs Immature Granulocytes: 0.12 10*3/uL — ABNORMAL HIGH (ref 0.00–0.07)
Basophils Absolute: 0.1 10*3/uL (ref 0.0–0.1)
Basophils Relative: 1 %
Eosinophils Absolute: 0.2 10*3/uL (ref 0.0–0.5)
Eosinophils Relative: 2 %
HCT: 40.8 % (ref 36.0–46.0)
Hemoglobin: 14.6 g/dL (ref 12.0–15.0)
Immature Granulocytes: 1 %
Lymphocytes Relative: 18 %
Lymphs Abs: 1.8 10*3/uL (ref 0.7–4.0)
MCH: 34.2 pg — ABNORMAL HIGH (ref 26.0–34.0)
MCHC: 35.8 g/dL (ref 30.0–36.0)
MCV: 95.6 fL (ref 80.0–100.0)
Monocytes Absolute: 0.6 10*3/uL (ref 0.1–1.0)
Monocytes Relative: 6 %
Neutro Abs: 7.4 10*3/uL (ref 1.7–7.7)
Neutrophils Relative %: 72 %
Platelets: 251 10*3/uL (ref 150–400)
RBC: 4.27 MIL/uL (ref 3.87–5.11)
RDW: 14.3 % (ref 11.5–15.5)
WBC: 10.3 10*3/uL (ref 4.0–10.5)
nRBC: 0 % (ref 0.0–0.2)

## 2021-07-19 LAB — URINALYSIS, ROUTINE W REFLEX MICROSCOPIC
Bilirubin Urine: NEGATIVE
Glucose, UA: NEGATIVE mg/dL
Ketones, ur: NEGATIVE mg/dL
Nitrite: NEGATIVE
Protein, ur: 30 mg/dL — AB
Specific Gravity, Urine: 1.003 — ABNORMAL LOW (ref 1.005–1.030)
pH: 6 (ref 5.0–8.0)

## 2021-07-19 LAB — I-STAT BETA HCG BLOOD, ED (MC, WL, AP ONLY): I-stat hCG, quantitative: <5 m[IU]/mL (ref <5)

## 2021-07-19 MED ORDER — THIAMINE HCL 100 MG PO TABS: 100.0000 mg | ORAL_TABLET | Freq: Every day | ORAL | Status: DC

## 2021-07-19 MED ORDER — LORAZEPAM 2 MG/ML IJ SOLN
0.0000 mg | Freq: Four times a day (QID) | INTRAMUSCULAR | Status: DC
  Administered 2021-07-19: 2 mg via INTRAVENOUS
  Filled 2021-07-19: qty 1

## 2021-07-19 MED ORDER — LORAZEPAM 2 MG/ML IJ SOLN: 0.0000 mg | Freq: Two times a day (BID) | INTRAMUSCULAR | Status: DC

## 2021-07-19 MED ORDER — LORAZEPAM 1 MG PO TABS: 0.0000 mg | ORAL_TABLET | Freq: Two times a day (BID) | ORAL | Status: DC

## 2021-07-19 MED ORDER — THIAMINE HCL 100 MG/ML IJ SOLN
100.0000 mg | Freq: Every day | INTRAMUSCULAR | Status: DC
  Filled 2021-07-19: qty 2

## 2021-07-19 MED ORDER — LORAZEPAM 2 MG/ML IJ SOLN
1.0000 mg | Freq: Once | INTRAMUSCULAR | Status: AC
  Administered 2021-07-19: 1 mg via INTRAVENOUS
  Filled 2021-07-19: qty 1

## 2021-07-19 MED ORDER — LORAZEPAM 1 MG PO TABS: 0.0000 mg | ORAL_TABLET | Freq: Four times a day (QID) | ORAL | Status: DC

## 2021-07-19 MED ORDER — NICOTINE 14 MG/24HR TD PT24
14.0000 mg | MEDICATED_PATCH | Freq: Every day | TRANSDERMAL | Status: DC
  Filled 2021-07-19: qty 1

## 2021-07-19 MED ORDER — LORAZEPAM 2 MG/ML IJ SOLN
0.5000 mg | Freq: Once | INTRAMUSCULAR | Status: AC
  Administered 2021-07-19: 0.5 mg via INTRAVENOUS
  Filled 2021-07-19: qty 1

## 2021-07-19 MED ORDER — HYDRALAZINE HCL 20 MG/ML IJ SOLN
10.0000 mg | Freq: Once | INTRAMUSCULAR | Status: AC
  Administered 2021-07-19: 10 mg via INTRAVENOUS
  Filled 2021-07-19: qty 1

## 2021-07-19 MED ORDER — HYDROCHLOROTHIAZIDE 25 MG PO TABS
25.0000 mg | ORAL_TABLET | Freq: Every day | ORAL | Status: DC
  Administered 2021-07-19: 25 mg via ORAL
  Filled 2021-07-19: qty 1

## 2021-07-19 NOTE — ED Triage Notes (Signed)
Pt via EMS to ED for eval of blurred vision and nausea while shopping today. Got in the car to go home but couldn't make it d/t same. Pt hypertensive to 290/130. Was just started on blood pressure medication two days ago and has not taken the dose for today. Denies vision changes or headache at time of triage.  ?

## 2021-07-19 NOTE — ED Provider Notes (Signed)
?[facility] ?Provider Note ? ? ?CSN: [id number] ?Arrival date & time: 07/19/21  1820 ? ?  ? ?History ? ?Chief Complaint  ?Patient presents with  ? Blurred Vision  ? Hypertension  ? ? ?Veronica Calhoun is a 36 y.o. female. ? ?HPI ?Patient is a 36 year old female with a history of anxiety, essential hypertension, who presents to the emergency department due to blurred vision.  Patient states that she used to be evaluated [facility] and was on spironolactone, HCTZ, metoprolol, as well as hydralazine for hypertension.  She states that she was having trouble getting follow-up appointments and ultimately ran out of these medications and has not been taking anything for about 1.5 years.  [facility] is [facility] and was started on HCTZ 2 days ago.  She states that she was instructed to take 25 mg for 1 week and then increase the dose to 50 mg.  She has taken 2 doses but forgot to take her morning dose today.  She states that while shopping she developed generalized weakness as well as an episode of blurry vision.  No chest pain, shortness of breath, abdominal pain, diaphoresis, nausea, vomiting.  States that her symptoms have since resolved.  She notes that she is currently having difficulty with her housing and has been much more stressed recently and feels as if her anxiety has acutely worsened today. ?  ? ?Home Medications ?Prior to Admission medications   ?Medication Sig Start Date End Date Taking? Authorizing Provider  ?benzonatate (TESSALON) 100 MG capsule Take 1 capsule (100 mg total) by mouth 2 (two) times daily as needed for cough. 07/16/21   Freddy FinnerMills, Hannah M, NP  ?FLUoxetine (PROZAC) 20 MG capsule Take 1 capsule (20 mg total) by mouth daily. 05/30/19   Julieanne MansonMulberry, Elizabeth, MD  ?fluticasone (FLONASE) 50 MCG/ACT nasal spray Place 2 sprays into both nostrils daily. 05/13/20   Junie SpencerHawks, Christy A, FNP  ?hydrALAZINE (APRESOLINE) 25 MG tablet Take 1  tablet (25 mg total) by mouth 2 (two) times daily. 04/15/19   Julieanne MansonMulberry, Elizabeth, MD  ?hydrochlorothiazide (HYDRODIURIL) 25 MG tablet Take 1 tablet (25 mg total) by mouth daily. 05/30/19   Julieanne MansonMulberry, Elizabeth, MD  ?imiquimod Mathis Dad(ALDARA) 5 % cream Apply topically 3 (three) times a week. Apply until total clearance or maximum of 16 weeks 07/25/19   Hermina StaggersErvin, Michael L, MD  ?metoprolol tartrate (LOPRESSOR) 100 MG tablet Take 1 tablet (100 mg total) by mouth 2 (two) times daily. 05/30/19   Julieanne MansonMulberry, Elizabeth, MD  ?spironolactone (ALDACTONE) 25 MG tablet Take 1 tablet (25 mg total) by mouth daily. 10/16/19   Janace ArisBast, Traci A, NP  ?   ? ?Allergies    ?Lisinopril and Amlodipine   ? ?Review of Systems   ?Review of Systems  ?All other systems reviewed and are negative. ?Ten systems reviewed and are negative for acute change, except as noted in the HPI.   ?Physical Exam ?Updated Vital Signs ?BP (!) 196/108   Pulse (!) 117   Temp 98.8 ?F (37.1 ?C) (Oral)   Resp (!) 23   SpO2 (!) 87%  ?Physical Exam ?Vitals and nursing note reviewed.  ?Constitutional:   ?   General: She is not in acute distress. ?   Appearance: Normal appearance. She is not ill-appearing, toxic-appearing or diaphoretic.  ?HENT:  ?   Head: Normocephalic and atraumatic.  ?   Right Ear: External ear normal.  ?   Left Ear: External  ear normal.  ?   Nose: Nose normal.  ?   Mouth/Throat:  ?   Mouth: Mucous membranes are moist.  ?   Pharynx: Oropharynx is clear. No oropharyngeal exudate or posterior oropharyngeal erythema.  ?Eyes:  ?   Extraocular Movements: Extraocular movements intact.  ?Cardiovascular:  ?   Rate and Rhythm: Regular rhythm. Tachycardia present.  ?   Pulses: Normal pulses.  ?   Heart sounds: Murmur heard.  ?  No friction rub. No gallop.  ?   Comments: Tachycardic around 105 bpm.  Systolic murmur noted.  No rubs or gallops. ?Pulmonary:  ?   Effort: Pulmonary effort is normal. No respiratory distress.  ?   Breath sounds: Normal breath sounds. No stridor. No  wheezing, rhonchi or rales.  ?Abdominal:  ?   General: Abdomen is flat.  ?   Palpations: Abdomen is soft.  ?   Tenderness: There is no abdominal tenderness.  ?   Comments: Abdomen is soft and nontender.  ?Musculoskeletal:     ?   General: Normal range of motion.  ?   Cervical back: Normal range of motion and neck supple. No tenderness.  ?   Right lower leg: No edema.  ?   Left lower leg: No edema.  ?   Comments: No pedal edema.  No calf tenderness.  2+ DP pulses.  ?Skin: ?   General: Skin is warm and dry.  ?Neurological:  ?   General: No focal deficit present.  ?   Mental Status: She is alert and oriented to person, place, and time.  ?Psychiatric:     ?   Mood and Affect: Mood normal.     ?   Behavior: Behavior normal.  ? ?ED Results / Procedures / Treatments   ?Labs ?(all labs ordered are listed, but only abnormal results are displayed) ?Labs Reviewed  ?COMPREHENSIVE METABOLIC PANEL - Abnormal; Notable for the following components:  ?    Result Value  ? Glucose, Bld 114 (*)   ? BUN <5 (*)   ? AST 246 (*)   ? ALT 162 (*)   ? Alkaline Phosphatase 131 (*)   ? All other components within normal limits  ?CBC WITH DIFFERENTIAL/PLATELET - Abnormal; Notable for the following components:  ? MCH 34.2 (*)   ? Abs Immature Granulocytes 0.12 (*)   ? All other components within normal limits  ?URINALYSIS, ROUTINE W REFLEX MICROSCOPIC - Abnormal; Notable for the following components:  ? APPearance CLOUDY (*)   ? Specific Gravity, Urine 1.003 (*)   ? Hgb urine dipstick LARGE (*)   ? Protein, ur 30 (*)   ? Leukocytes,Ua TRACE (*)   ? Bacteria, UA MANY (*)   ? All other components within normal limits  ?RESP PANEL BY RT-PCR (FLU A&B, COVID) ARPGX2  ?I-STAT BETA HCG BLOOD, ED ([facility], [facility], [facility] ONLY)  ? ?EKG ?None ? ?Radiology ?No results found. ? ?Procedures ?Marland KitchenCritical Care ?Performed by: Placido Sou, PA-C ?Authorized by: Placido Sou, PA-C  ? ?Critical care provider statement:  ?  Critical care time (minutes):  30 ?  Critical  care was necessary to treat or prevent imminent or life-threatening deterioration of the following conditions: Alcohol withdrawal. ?  Critical care was time spent personally by me on the following activities:  Development of treatment plan with patient or surrogate, discussions with consultants, evaluation of patient's response to treatment, examination of patient, ordering and review of laboratory studies, ordering and review of radiographic studies, ordering  and performing treatments and interventions, pulse oximetry, re-evaluation of patient's condition and review of old charts  ? ?Medications Ordered in ED ?Medications  ?hydrochlorothiazide (HYDRODIURIL) tablet 25 mg (25 mg Oral Given 07/19/21 1919)  ?LORazepam (ATIVAN) injection 0-4 mg (has no administration in time range)  ?  Or  ?LORazepam (ATIVAN) tablet 0-4 mg (has no administration in time range)  ?LORazepam (ATIVAN) injection 0-4 mg (has no administration in time range)  ?  Or  ?LORazepam (ATIVAN) tablet 0-4 mg (has no administration in time range)  ?thiamine tablet 100 mg (has no administration in time range)  ?  Or  ?thiamine (B-1) injection 100 mg (has no administration in time range)  ?LORazepam (ATIVAN) injection 0.5 mg (0.5 mg Intravenous Given 07/19/21 1919)  ?hydrALAZINE (APRESOLINE) injection 10 mg (10 mg Intravenous Given 07/19/21 2109)  ?LORazepam (ATIVAN) injection 1 mg (1 mg Intravenous Given 07/19/21 2213)  ? ?ED Course/ Medical Decision Making/ A&P ?Clinical Course as of 07/19/21 2318  ?Fri Jul 19, 2021  ?2109 Patient reassessed.  Still mildly tachycardic.  Blood pressure has improved mildly.  States that she is still asymptomatic.  We discussed her lab work including her LFTs.  She states that she drinks about 3 to 4, 16 ounce beers on a daily basis.  She has had 1 beer earlier today.  Denies any history of withdrawals.  States that she is eager to quit drinking. [LJ]  ?  ?Clinical Course User Index ?[LJ] Placido Sou, PA-C  ? ?                         ?Medical Decision Making ?Amount and/or Complexity of Data Reviewed ?Labs: ordered. ? ?Risk ?OTC drugs. ?Prescription drug management. ?Decision regarding hospitalization. ? ? ?Pt is a 36 y.o. fem

## 2021-07-20 DIAGNOSIS — F1011 Alcohol abuse, in remission: Secondary | ICD-10-CM

## 2021-07-20 DIAGNOSIS — I10 Essential (primary) hypertension: Secondary | ICD-10-CM

## 2021-07-20 MED ORDER — METOPROLOL TARTRATE 50 MG PO TABS: 25.0000 mg | ORAL_TABLET | Freq: Two times a day (BID) | ORAL | 0 refills | Status: DC

## 2021-07-20 MED ORDER — LABETALOL HCL 5 MG/ML IV SOLN: 20.0000 mg | Freq: Once | INTRAVENOUS | Status: DC

## 2021-07-20 MED ORDER — METOPROLOL TARTRATE 25 MG PO TABS
25.0000 mg | ORAL_TABLET | Freq: Once | ORAL | Status: AC
  Administered 2021-07-20: 25 mg via ORAL
  Filled 2021-07-20: qty 1

## 2021-07-20 NOTE — Subjective & Objective (Addendum)
CC: blurred vision ?HPI: ?36 year old female with a history of morbid obesity, hypertension, alcohol abuse presents to the ER today with blurred vision.  Patient states that she just started taking blood pressure medications again about 3 days ago.  She was seen by new primary care provider 3 days ago.  Started on HCTZ.  Patient has been off blood pressure medicines for at least 1 year.  She had been on taking spironolactone, HCTZ and Lopressor in the past.  But due to lack of insurance, she stopped taking blood pressure medicines.   ? ?Patient states that she was shopping at [employer].  When she came out of store, she noticed that she had some blurred vision.  She got in her car.  She drove a [employer].  Daughter is was with her.  She continues have some blurred vision.  She felt a little nauseated.  She called 911.  Patient brought to the ER.  By time she arrived to the ER, the blurred vision had stopped.  The nausea had stopped. ? ?She continues to consume alcohol on a regular basis.  She states that she drinks 4 to 6 cans of beer per day.  These are 16 ounce beers.  She also consumes 4 shots of vodka every other day.  She hates the is been doing this for many years now.  She does admit that she has increased the amount of liquor she is consuming in the last 2 to 3 months.  She states that she drinks at a stress.  She states that she has tried to stop drinking the past but has gotten "the shakes" when she stops drinking.  She is never had an alcohol withdrawal seizure.  She has no intention of stopping drinking. ? ?[facility] hospitalist was contacted due to concern for the patient having alcohol withdrawal.  However the patient has no intention of stopping consuming alcohol.  Patient not having DTs.  No point in admitting the patient if she is continue to drink alcohol when she gets out of the hospital. ?

## 2021-07-20 NOTE — Assessment & Plan Note (Signed)
Pt drinks 16 oz beers(4-6 cans per day). Also consumes 4 shots of vodka every other day. Pt states she drinks due to stress in her life. Pt states she has no intention of stopping drinking. Discussed that she is an alcoholic. Pt admits she has gotten "the shakes" in the past when she tries to quit drinking. Discussed that she needs to wean herself off alcohol. Preferably slowly. Decrease by 1 alcoholic drink per day over a period of 8-12 weeks. I.e. instead of having 4 shots of vodka in addition to 4 beers, that she decreases to 3 shots of liquor and 4 beers. Do this for 1 week.  Then decrease to 2 shots of liquor and 4 beers for 1 weeks, etc. Discussed that ativan would not be prescribed if she is going to continue to consume alcohol. After she stops drinking completely, she would be a candidate for antabuse. Discussed with EDP. Since pt has not intention of quitting alcohol, then there is no point in admitting her for alcohol withdrawals symptoms. Pt is not having DTs. ?

## 2021-07-20 NOTE — Discharge Instructions (Signed)
You were seen in the emergency department for evaluation of high blood pressure and high heart rate.  We are concerned that this may be related to alcohol withdrawal.  We attempted to admit you to the hospital, but you informed the hospitalist that you have no intention to stop drinking and would like to wean down yourself at home.  Alcohol withdrawal is very serious, but we cannot keep in the hospital to treat alcohol withdrawal if you are going to continue to drink.  Thus, you will be discharged with blood pressure medication and will be very important to monitor your symptoms.  If you feel worse, have worsening heart rates, uncontrolled blood pressures have persistent vomiting, shaking or any other concerning symptoms please return the emergency department immediately for admission. ?

## 2021-07-20 NOTE — Assessment & Plan Note (Signed)
Continue with HCTZ. Recommend adding lopressor 25 mg bid. ?

## 2021-07-20 NOTE — Consult Note (Signed)
?Hospitalist Consultation ?History and Physical  ? ? ?Veronica Calhoun [id number] DOB: 1986-02-25 DOA: 07/19/2021 ? ?DOS: the patient was seen and examined on 07/19/2021 ? ?PCP: Pcp, No  ? ?Patient coming from: Home ? ?I have personally briefly reviewed patient's old medical records in [place] ? ?CC: blurred vision ?HPI: ?36 year old female with a history of morbid obesity, hypertension, alcohol abuse presents to the ER today with blurred vision.  Patient states that she just started taking blood pressure medications again about 3 days ago.  She was seen by new primary care provider 3 days ago.  Started on HCTZ.  Patient has been off blood pressure medicines for at least 1 year.  She had been on taking spironolactone, HCTZ and Lopressor in the past.  But due to lack of insurance, she stopped taking blood pressure medicines.   ? ?Patient states that she was shopping at [employer].  When she came out of store, she noticed that she had some blurred vision.  She got in her car.  She drove a Environmental consultant.  Daughter is was with her.  She continues have some blurred vision.  She felt a little nauseated.  She called 911.  Patient brought to the ER.  By time she arrived to the ER, the blurred vision had stopped.  The nausea had stopped. ? ?She continues to consume alcohol on a regular basis.  She states that she drinks 4 to 6 cans of beer per day.  These are 16 ounce beers.  She also consumes 4 shots of vodka every other day.  She hates the is been doing this for many years now.  She does admit that she has increased the amount of liquor she is consuming in the last 2 to 3 months.  She states that she drinks at a stress.  She states that she has tried to stop drinking the past but has gotten "the shakes" when she stops drinking.  She is never had an alcohol withdrawal seizure.  She has no intention of stopping drinking. ? ?[facility] hospitalist was contacted due to concern for the patient having alcohol  withdrawal.  However the patient has no intention of stopping consuming alcohol.  Patient not having DTs.  No point in admitting the patient if she is continue to drink alcohol when she gets out of the hospital.  ? ?ED Course: given IV hydralazine and po hctz.  ? ?Review of Systems:  ?Review of Systems  ?Constitutional: Negative.   ?HENT: Negative.    ?Eyes:  Positive for blurred vision.  ?Respiratory: Negative.    ?Cardiovascular: Negative.   ?Gastrointestinal: Negative.   ?Genitourinary: Negative.   ?Musculoskeletal: Negative.   ?Skin: Negative.   ?Neurological:  Positive for dizziness.  ?Endo/Heme/Allergies: Negative.   ?Psychiatric/Behavioral: Negative.    ?All other systems reviewed and are negative. ? ?Past Medical History:  ?Diagnosis Date  ? Anxiety 2013  ? Hypertension 2014  ? ? ?Past Surgical History:  ?Procedure Laterality Date  ? CESAREAN SECTION  03/18/2007  ? ? ? reports that she has been smoking cigarettes. She has a 5.00 pack-year smoking history. She has never used smokeless tobacco. She reports that she does not currently use alcohol after a past usage of about 10.0 standard drinks per week. She reports that she does not use drugs. ? ?Allergies  ?Allergen Reactions  ? Lisinopril Other (See Comments)  ?  Reports Lip swelling  ? Amlodipine Other (See Comments)  ?  "like I was going  to pass out"  ? ? ?Family History  ?Problem Relation Age of Onset  ? Heart murmur Father   ? AAA (abdominal aortic aneurysm) Sister   ? Urinary tract infection Daughter   ? Hypertension Maternal Grandmother   ? ? ?Prior to Admission medications   ?Medication Sig Start Date End Date Taking? Authorizing Provider  ?hydrochlorothiazide (HYDRODIURIL) 25 MG tablet Take 1 tablet (25 mg total) by mouth daily. ?Patient taking differently: Take 12.5-25 mg by mouth See admin instructions. 12.5 mg qd x 7 days, then 25 mg qd 05/30/19  Yes Mack Hook, MD  ?hydrOXYzine (ATARAX) 25 MG tablet Take 25 mg by mouth 2 (two) times daily  as needed for itching. 07/11/21  Yes [provider]  ?sertraline (ZOLOFT) 50 MG tablet Take 50 mg by mouth daily. 07/11/21  Yes [provider]  ?triamcinolone cream (KENALOG) 0.1 % Apply 1 application. topically daily as needed. 07/11/21  Yes [provider]  ?benzonatate (TESSALON) 100 MG capsule Take 1 capsule (100 mg total) by mouth 2 (two) times daily as needed for cough. ?Patient not taking: Reported on 07/19/2021 07/16/21   Perlie Mayo, NP  ?FLUoxetine (PROZAC) 20 MG capsule Take 1 capsule (20 mg total) by mouth daily. ?Patient not taking: Reported on 07/19/2021 05/30/19   Mack Hook, MD  ?fluticasone [facility]) 50 MCG/ACT nasal spray Place 2 sprays into both nostrils daily. ?Patient not taking: Reported on 07/19/2021 05/13/20   Sharion Balloon, FNP  ?hydrALAZINE (APRESOLINE) 25 MG tablet Take 1 tablet (25 mg total) by mouth 2 (two) times daily. ?Patient not taking: Reported on 07/19/2021 04/15/19   Mack Hook, MD  ?imiquimod [facility]) 5 % cream Apply topically 3 (three) times a week. Apply until total clearance or maximum of 16 weeks ?Patient not taking: Reported on 07/19/2021 07/25/19   Chancy Milroy, MD  ?metoprolol tartrate (LOPRESSOR) 100 MG tablet Take 1 tablet (100 mg total) by mouth 2 (two) times daily. ?Patient not taking: Reported on 07/19/2021 05/30/19   Mack Hook, MD  ?spironolactone (ALDACTONE) 25 MG tablet Take 1 tablet (25 mg total) by mouth daily. ?Patient not taking: Reported on 07/19/2021 10/16/19   Orvan July, NP  ? ? ?Physical Exam: ?Vitals:  ? 07/19/21 2230 07/19/21 2245 07/19/21 2300 07/19/21 2319  ?BP: (!) 181/112 (!) 178/110 (!) 196/108 (!) 176/100  ?Pulse: (!) 117 (!) 117 (!) 117 (!) 117  ?Resp: (!) 22 (!) 25 (!) 23   ?Temp:      ?TempSrc:      ?SpO2: 100% 100% (!) 87%   ? ? ?Physical Exam ?Vitals and nursing note reviewed.  ?Constitutional:   ?   General: She is not in acute distress. ?   Appearance: Normal appearance. She is obese. She is  not ill-appearing, toxic-appearing or diaphoretic.  ?HENT:  ?   Head: Normocephalic and atraumatic.  ?   Nose: Nose normal. No rhinorrhea.  ?Cardiovascular:  ?   Rate and Rhythm: Regular rhythm. Tachycardia present.  ?Pulmonary:  ?   Effort: Pulmonary effort is normal. No respiratory distress.  ?   Breath sounds: Normal breath sounds. No wheezing or rales.  ?Abdominal:  ?   General: Bowel sounds are normal. There is no distension.  ?   Tenderness: There is no abdominal tenderness. There is no guarding or rebound.  ?Musculoskeletal:  ?   Right lower leg: No edema.  ?   Left lower leg: No edema.  ?Skin: ?   General: Skin is  warm and dry.  ?   Capillary Refill: Capillary refill takes less than 2 seconds.  ?Neurological:  ?   General: No focal deficit present.  ?   Mental Status: She is alert and oriented to person, place, and time.  ?  ? ?Labs on Admission: I have personally reviewed following labs and imaging studies ? ?CBC: ?Recent Labs  ?Lab 07/19/21 ?1900  ?WBC 10.3  ?NEUTROABS 7.4  ?HGB 14.6  ?HCT 40.8  ?MCV 95.6  ?PLT 251  ? ?Basic Metabolic Panel: ?Recent Labs  ?Lab 07/19/21 ?1900  ?NA 135  ?K 3.7  ?CL 101  ?CO2 24  ?GLUCOSE 114*  ?BUN <5*  ?CREATININE 0.59  ?CALCIUM 9.1  ? ?GFR: ?CrCl cannot be calculated (Unknown ideal weight.). ?Liver Function Tests: ?Recent Labs  ?Lab 07/19/21 ?1900  ?AST 246*  ?ALT 162*  ?ALKPHOS 131*  ?BILITOT 1.1  ?PROT 7.9  ?ALBUMIN 3.8  ? ?No results for input(s): LIPASE, AMYLASE in the last 168 hours. ?No results for input(s): AMMONIA in the last 168 hours. ?Coagulation Profile: ?No results for input(s): INR, PROTIME in the last 168 hours. ?Cardiac Enzymes: ?No results for input(s): CKTOTAL, CKMB, CKMBINDEX, TROPONINI, TROPONINIHS in the last 168 hours. ?BNP (last 3 results) ?No results for input(s): PROBNP in the last 8760 hours. ?HbA1C: ?No results for input(s): HGBA1C in the last 72 hours. ?CBG: ?No results for input(s): GLUCAP in the last 168 hours. ?Lipid Profile: ?No results for  input(s): CHOL, HDL, LDLCALC, TRIG, CHOLHDL, LDLDIRECT in the last 72 hours. ?Thyroid Function Tests: ?No results for input(s): TSH, T4TOTAL, FREET4, T3FREE, THYROIDAB in the last 72 hours. ?Anemia Panel:

## 2021-08-19 NOTE — Telephone Encounter (Signed)
Attempted to call patient a few times to get her scheduled, but have been unable to reach her ?

## 2021-10-23 ENCOUNTER — Telehealth: Payer: Medicaid Other | Admitting: Physician Assistant

## 2021-10-23 ENCOUNTER — Telehealth: Payer: Medicaid Other | Admitting: Nurse Practitioner

## 2021-10-23 DIAGNOSIS — R599 Enlarged lymph nodes, unspecified: Secondary | ICD-10-CM

## 2021-10-23 DIAGNOSIS — R591 Generalized enlarged lymph nodes: Secondary | ICD-10-CM

## 2021-10-23 DIAGNOSIS — H9201 Otalgia, right ear: Secondary | ICD-10-CM

## 2021-10-23 DIAGNOSIS — R6884 Jaw pain: Secondary | ICD-10-CM

## 2021-10-23 NOTE — Progress Notes (Signed)
Because of severity of pain and swollen lymph nodes/jaw pain, I feel your condition warrants further evaluation and I recommend that you be seen in a face to face visit. You need an examination of the head, neck and ear -- external and internal as I am concerned for a more complicated infection.    NOTE: There will be NO CHARGE for this eVisit   If you are having a true medical emergency please call 911.      For an urgent face to face visit, [facility] has seven urgent care centers for your convenience:     [facility] Health Urgent Care Center at [facility] Directions [phone] [address] [place], [place] [address]    [facility] Health Urgent Care Center [facility]) Get Driving Directions [phone] [address] [place], [place] [address]  [facility] Health Urgent Care Center [facility]) Get Driving Directions [phone] [address] [place],  [place]  [address]  [facility] Health Urgent Care Center [facility] - at [facility] Directions  [phone] [address] [place],  [place] [address]   [facility] Health Urgent Care at [facility] Get Driving Directions [phone] [address] [place], [place] [address]   [facility] Health Urgent Care at [facility] Get Driving Directions  [phone] [address] [place], [place] [address]   [facility] Health Urgent Care at [facility] Directions [phone] [address], [address] [place], [place] [address]  Your MyChart E-visit questionnaire answers were reviewed by a board certified advanced clinical practitioner to complete your personal care plan based on your specific symptoms.  Thank you for using e-Visits.

## 2021-10-23 NOTE — Progress Notes (Signed)
Virtual Visit Consent   Veronica Calhoun, you are scheduled for a virtual visit with a [facility] provider today. Just as with appointments in the office, your consent must be obtained to participate. Your consent will be active for this visit and any virtual visit you may have with one of our providers in the next 365 days. If you have a MyChart account, a copy of this consent can be sent to you electronically.  As this is a virtual visit, video technology does not allow for your provider to perform a traditional examination. This may limit your provider's ability to fully assess your condition. If your provider identifies any concerns that need to be evaluated in person or the need to arrange testing (such as labs, EKG, etc.), we will make arrangements to do so. Although advances in technology are sophisticated, we cannot ensure that it will always work on either your end or our end. If the connection with a video visit is poor, the visit may have to be switched to a telephone visit. With either a video or telephone visit, we are not always able to ensure that we have a secure connection.  By engaging in this virtual visit, you consent to the provision of healthcare and authorize for your insurance to be billed (if applicable) for the services provided during this visit. Depending on your insurance coverage, you may receive a charge related to this service.  I need to obtain your verbal consent now. Are you willing to proceed with your visit today? Sybol Fraley has provided verbal consent on 10/23/2021 for a virtual visit (video or telephone). Viviano Simas, FNP  Date: 10/23/2021 11:15 AM  Virtual Visit via Video Note   I, Viviano Simas, connected with  Veronica Calhoun  ([id number], 08/30/1985) on 10/23/21 at 11:15 AM EDT by a video-enabled telemedicine application and verified that I am speaking with the correct person using two identifiers.  Location: Patient: Virtual Visit Location Patient:  Home Provider: Virtual Visit Location Provider: Home Office   I discussed the limitations of evaluation and management by telemedicine and the availability of in person appointments. The patient expressed understanding and agreed to proceed.    History of Present Illness: Veronica Calhoun is a 36 y.o. who identifies as a female who was assigned female at birth, and is being seen today for lymph node tenderness to right posterior neck for the past 4 days, she started to have ear pain and right face pain yesterday as well, denies any fever. She did have a URI last week that resolved with OTC medications.    Denies any recent swimming.  Denies any changes in hearing.  Denies a sore throat or pain with swallowing though when she does swallow she can feel the lymph node region.  Denies any pain with moving her head or limitations to ROM aside from tenderness at the lymph node site.   She has had similar symptoms in the past and symptoms resolved on their own.    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 (Patient not taking: Reported on 07/19/2021), Disp: 20 capsule, Rfl: 0   FLUoxetine (PROZAC) 20 MG capsule, Take 1 capsule (20 mg total) by mouth daily. (Patient not taking: Reported on 07/19/2021), Disp: 30 capsule, Rfl: 5   fluticasone (FLONASE) 50 MCG/ACT nasal spray,  Place 2 sprays into both nostrils daily. (Patient not taking: Reported on 07/19/2021), Disp: 16 g, Rfl: 6   hydrALAZINE (APRESOLINE) 25 MG tablet, Take 1 tablet (25 mg total) by mouth 2 (two) times daily. (Patient not taking: Reported on 07/19/2021), Disp: 60 tablet, Rfl: 4   hydrochlorothiazide (HYDRODIURIL) 25 MG tablet, Take 1 tablet (25 mg total) by mouth daily. (Patient taking differently: Take 12.5-25 mg by mouth See admin instructions. 12.5 mg qd x 7 days, then 25 mg qd), Disp: 30 tablet, Rfl: 11   hydrOXYzine (ATARAX) 25 MG tablet, Take 25 mg by mouth 2 (two) times daily as needed for itching., Disp: , Rfl:    imiquimod (ALDARA) 5 % cream, Apply topically 3 (three) times a week. Apply until total clearance or maximum of 16 weeks (Patient not taking: Reported on 07/19/2021), Disp: 24 each, Rfl: 5   metoprolol tartrate (LOPRESSOR) 50 MG tablet, Take 0.5 tablets (25 mg total) by mouth 2 (two) times daily., Disp: 30 tablet, Rfl: 0   sertraline (ZOLOFT) 50 MG tablet, Take 50 mg by mouth daily., Disp: , Rfl:    spironolactone (ALDACTONE) 25 MG tablet, Take 1 tablet (25 mg total) by mouth daily. (Patient not taking: Reported on 07/19/2021), Disp: 30 tablet, Rfl: 0   triamcinolone cream (KENALOG) 0.1 %, Apply 1 application. topically daily as needed., Disp: , Rfl:   Observations/Objective: Patient is well-developed, well-nourished in no acute distress.  Resting comfortably at home.  Head is normocephalic, atraumatic.  No labored breathing.  Speech is clear and coherent with logical content.  Patient is alert and oriented at baseline.  Neck with full ROM   Assessment and Plan: 1. Lymph node enlargement Advised 600mg  ibuprofen with food three times daily as needed. If lymph node remains enlarged beyond 48 hours without improvement with OTC medication she should schedule a follow up to be evaluated in person.   Discussed possible reasons including infected tooth/gums, ear infection or resolving  recent URI.   With new onset of symptoms including but not limited to fever she will seek evaluation for bacterial infection as discussed    Follow Up Instructions: I discussed the assessment and treatment plan with the patient. The patient was provided an opportunity to ask questions and all were answered. The patient agreed with the plan and demonstrated an understanding of the instructions.  A copy of instructions were sent to the patient via MyChart unless otherwise noted below.    The patient was advised to call back or seek an in-person evaluation if the symptoms worsen or if the condition fails to improve as anticipated.  Time:  I spent 10 minutes with the patient via telehealth technology discussing the above problems/concerns.    , FNP

## 2021-11-15 DIAGNOSIS — Z8679 Personal history of other diseases of the circulatory system: Secondary | ICD-10-CM

## 2021-11-15 DIAGNOSIS — R87619 Unspecified abnormal cytological findings in specimens from cervix uteri: Secondary | ICD-10-CM | POA: Insufficient documentation

## 2021-11-15 HISTORY — DX: Personal history of other diseases of the circulatory system: Z86.79

## 2021-12-28 ENCOUNTER — Emergency Department (HOSPITAL_COMMUNITY): Payer: Medicaid Other

## 2021-12-28 ENCOUNTER — Encounter (HOSPITAL_COMMUNITY): Payer: Self-pay

## 2021-12-28 ENCOUNTER — Inpatient Hospital Stay (HOSPITAL_COMMUNITY)
Admission: EM | Admit: 2021-12-28 | Discharge: 2021-12-30 | DRG: 439 | Disposition: A | Discharge status: Home / Self Care | Payer: Medicaid Other | Attending: Internal Medicine | Admitting: Internal Medicine

## 2021-12-28 ENCOUNTER — Other Ambulatory Visit: Payer: Self-pay

## 2021-12-28 DIAGNOSIS — R112 Nausea with vomiting, unspecified: Secondary | ICD-10-CM

## 2021-12-28 DIAGNOSIS — K859 Acute pancreatitis without necrosis or infection, unspecified: Secondary | ICD-10-CM | POA: Diagnosis present

## 2021-12-28 DIAGNOSIS — I160 Hypertensive urgency: Secondary | ICD-10-CM | POA: Diagnosis present

## 2021-12-28 DIAGNOSIS — F1020 Alcohol dependence, uncomplicated: Secondary | ICD-10-CM

## 2021-12-28 DIAGNOSIS — Z79899 Other long term (current) drug therapy: Secondary | ICD-10-CM

## 2021-12-28 DIAGNOSIS — F411 Generalized anxiety disorder: Secondary | ICD-10-CM | POA: Diagnosis present

## 2021-12-28 DIAGNOSIS — Z8249 Family history of ischemic heart disease and other diseases of the circulatory system: Secondary | ICD-10-CM

## 2021-12-28 DIAGNOSIS — I10 Essential (primary) hypertension: Secondary | ICD-10-CM

## 2021-12-28 DIAGNOSIS — F32A Depression, unspecified: Secondary | ICD-10-CM | POA: Diagnosis present

## 2021-12-28 DIAGNOSIS — E876 Hypokalemia: Secondary | ICD-10-CM | POA: Diagnosis not present

## 2021-12-28 DIAGNOSIS — Z91148 Patient's other noncompliance with medication regimen for other reason: Secondary | ICD-10-CM

## 2021-12-28 DIAGNOSIS — K852 Alcohol induced acute pancreatitis without necrosis or infection: Principal | ICD-10-CM

## 2021-12-28 DIAGNOSIS — T465X6A Underdosing of other antihypertensive drugs, initial encounter: Secondary | ICD-10-CM | POA: Diagnosis present

## 2021-12-28 DIAGNOSIS — F419 Anxiety disorder, unspecified: Secondary | ICD-10-CM | POA: Diagnosis present

## 2021-12-28 DIAGNOSIS — Z6841 Body Mass Index (BMI) 40.0 and over, adult: Secondary | ICD-10-CM

## 2021-12-28 DIAGNOSIS — F17210 Nicotine dependence, cigarettes, uncomplicated: Secondary | ICD-10-CM | POA: Diagnosis present

## 2021-12-28 DIAGNOSIS — D72829 Elevated white blood cell count, unspecified: Secondary | ICD-10-CM | POA: Diagnosis not present

## 2021-12-28 DIAGNOSIS — Z888 Allergy status to other drugs, medicaments and biological substances status: Secondary | ICD-10-CM

## 2021-12-28 DIAGNOSIS — R1011 Right upper quadrant pain: Principal | ICD-10-CM

## 2021-12-28 DIAGNOSIS — E871 Hypo-osmolality and hyponatremia: Secondary | ICD-10-CM | POA: Diagnosis present

## 2021-12-28 LAB — URINALYSIS, ROUTINE W REFLEX MICROSCOPIC
Bilirubin Urine: NEGATIVE
Glucose, UA: NEGATIVE mg/dL
Hgb urine dipstick: NEGATIVE
Ketones, ur: 5 mg/dL — AB
Leukocytes,Ua: NEGATIVE
Nitrite: NEGATIVE
Protein, ur: 100 mg/dL — AB
Specific Gravity, Urine: 1.005 (ref 1.005–1.030)
pH: 6 (ref 5.0–8.0)

## 2021-12-28 LAB — I-STAT CHEM 8, ED
BUN: 4 mg/dL — ABNORMAL LOW (ref 6–20)
Calcium, Ion: 1.18 mmol/L (ref 1.15–1.40)
Chloride: 99 mmol/L (ref 98–111)
Creatinine, Ser: 0.5 mg/dL (ref 0.44–1.00)
Glucose, Bld: 125 mg/dL — ABNORMAL HIGH (ref 70–99)
HCT: 45 % (ref 36.0–46.0)
Hemoglobin: 15.3 g/dL — ABNORMAL HIGH (ref 12.0–15.0)
Potassium: 3.7 mmol/L (ref 3.5–5.1)
Sodium: 136 mmol/L (ref 135–145)
TCO2: 24 mmol/L (ref 22–32)

## 2021-12-28 LAB — CBC WITH DIFFERENTIAL/PLATELET
Abs Immature Granulocytes: 0.1 10*3/uL — ABNORMAL HIGH (ref 0.00–0.07)
Basophils Absolute: 0.1 10*3/uL (ref 0.0–0.1)
Basophils Relative: 1 %
Eosinophils Absolute: 0.1 10*3/uL (ref 0.0–0.5)
Eosinophils Relative: 0 %
HCT: 43.4 % (ref 36.0–46.0)
Hemoglobin: 15.4 g/dL — ABNORMAL HIGH (ref 12.0–15.0)
Immature Granulocytes: 1 %
Lymphocytes Relative: 13 %
Lymphs Abs: 1.6 10*3/uL (ref 0.7–4.0)
MCH: 34 pg (ref 26.0–34.0)
MCHC: 35.5 g/dL (ref 30.0–36.0)
MCV: 95.8 fL (ref 80.0–100.0)
Monocytes Absolute: 0.9 10*3/uL (ref 0.1–1.0)
Monocytes Relative: 7 %
Neutro Abs: 9.6 10*3/uL — ABNORMAL HIGH (ref 1.7–7.7)
Neutrophils Relative %: 78 %
Platelets: 239 10*3/uL (ref 150–400)
RBC: 4.53 MIL/uL (ref 3.87–5.11)
RDW: 16 % — ABNORMAL HIGH (ref 11.5–15.5)
WBC: 12.2 10*3/uL — ABNORMAL HIGH (ref 4.0–10.5)
nRBC: 0 % (ref 0.0–0.2)

## 2021-12-28 LAB — RAPID URINE DRUG SCREEN, HOSP PERFORMED
Amphetamines: NOT DETECTED
Barbiturates: NOT DETECTED
Benzodiazepines: NOT DETECTED
Cocaine: NOT DETECTED
Opiates: POSITIVE — AB
Tetrahydrocannabinol: NOT DETECTED

## 2021-12-28 LAB — COMPREHENSIVE METABOLIC PANEL
ALT: 82 U/L — ABNORMAL HIGH (ref 0–44)
AST: 70 U/L — ABNORMAL HIGH (ref 15–41)
Albumin: 4.3 g/dL (ref 3.5–5.0)
Alkaline Phosphatase: 108 U/L (ref 38–126)
Anion gap: 9 (ref 5–15)
BUN: 7 mg/dL (ref 6–20)
CO2: 24 mmol/L (ref 22–32)
Calcium: 9.6 mg/dL (ref 8.9–10.3)
Chloride: 101 mmol/L (ref 98–111)
Creatinine, Ser: 0.54 mg/dL (ref 0.44–1.00)
GFR, Estimated: >60 mL/min (ref >60)
Glucose, Bld: 123 mg/dL — ABNORMAL HIGH (ref 70–99)
Potassium: 3.7 mmol/L (ref 3.5–5.1)
Sodium: 134 mmol/L — ABNORMAL LOW (ref 135–145)
Total Bilirubin: 1.4 mg/dL — ABNORMAL HIGH (ref 0.3–1.2)
Total Protein: 8.6 g/dL — ABNORMAL HIGH (ref 6.5–8.1)

## 2021-12-28 LAB — I-STAT BETA HCG BLOOD, ED (MC, WL, AP ONLY): I-stat hCG, quantitative: <5 m[IU]/mL (ref <5)

## 2021-12-28 LAB — LIPASE, BLOOD: Lipase: 86 U/L — ABNORMAL HIGH (ref 11–51)

## 2021-12-28 MED ORDER — LABETALOL HCL 5 MG/ML IV SOLN
10.0000 mg | Freq: Once | INTRAVENOUS | Status: AC
  Administered 2021-12-28: 10 mg via INTRAVENOUS
  Filled 2021-12-28: qty 4

## 2021-12-28 MED ORDER — SODIUM CHLORIDE 0.9 % IV SOLN: INTRAVENOUS | Status: AC

## 2021-12-28 MED ORDER — LORAZEPAM 2 MG/ML IJ SOLN
1.0000 mg | Freq: Once | INTRAMUSCULAR | Status: AC
  Administered 2021-12-28: 1 mg via INTRAVENOUS
  Filled 2021-12-28: qty 1

## 2021-12-28 MED ORDER — SODIUM CHLORIDE 0.9% FLUSH
3.0000 mL | Freq: Two times a day (BID) | INTRAVENOUS | Status: DC
  Administered 2021-12-29 – 2021-12-30 (×2): 3 mL via INTRAVENOUS

## 2021-12-28 MED ORDER — HYDROMORPHONE HCL 2 MG/ML IJ SOLN
1.0000 mg | Freq: Once | INTRAMUSCULAR | Status: AC
  Administered 2021-12-28: 1 mg via INTRAVENOUS
  Filled 2021-12-28: qty 1

## 2021-12-28 MED ORDER — POLYETHYLENE GLYCOL 3350 17 G PO PACK: 17.0000 g | PACK | Freq: Every day | ORAL | Status: DC | PRN

## 2021-12-28 MED ORDER — ACETAMINOPHEN 650 MG RE SUPP: 650.0000 mg | Freq: Four times a day (QID) | RECTAL | Status: DC | PRN

## 2021-12-28 MED ORDER — HYDRALAZINE HCL 20 MG/ML IJ SOLN
5.0000 mg | Freq: Once | INTRAMUSCULAR | Status: AC
  Administered 2021-12-28: 5 mg via INTRAVENOUS
  Filled 2021-12-28: qty 1

## 2021-12-28 MED ORDER — IOHEXOL 300 MG/ML  SOLN
100.0000 mL | Freq: Once | INTRAMUSCULAR | Status: AC | PRN
  Administered 2021-12-28: 100 mL via INTRAVENOUS

## 2021-12-28 MED ORDER — LABETALOL HCL 5 MG/ML IV SOLN
5.0000 mg | Freq: Once | INTRAVENOUS | Status: AC
  Administered 2021-12-28: 5 mg via INTRAVENOUS
  Filled 2021-12-28: qty 4

## 2021-12-28 MED ORDER — ENOXAPARIN SODIUM 40 MG/0.4ML IJ SOSY
40.0000 mg | PREFILLED_SYRINGE | INTRAMUSCULAR | Status: DC
  Administered 2021-12-29 – 2021-12-30 (×2): 40 mg via SUBCUTANEOUS
  Filled 2021-12-28 (×2): qty 0.4

## 2021-12-28 MED ORDER — MORPHINE SULFATE (PF) 4 MG/ML IV SOLN
4.0000 mg | Freq: Once | INTRAVENOUS | Status: AC
  Administered 2021-12-28: 4 mg via INTRAVENOUS
  Filled 2021-12-28: qty 1

## 2021-12-28 MED ORDER — HYDROMORPHONE HCL 2 MG/ML IJ SOLN
0.5000 mg | INTRAMUSCULAR | Status: DC | PRN
  Administered 2021-12-29 – 2021-12-30 (×9): 1 mg via INTRAVENOUS
  Filled 2021-12-28 (×9): qty 1

## 2021-12-28 MED ORDER — ONDANSETRON HCL 4 MG/2ML IJ SOLN
4.0000 mg | Freq: Once | INTRAMUSCULAR | Status: AC
  Administered 2021-12-28: 4 mg via INTRAVENOUS
  Filled 2021-12-28: qty 2

## 2021-12-28 MED ORDER — LABETALOL HCL 5 MG/ML IV SOLN
5.0000 mg | INTRAVENOUS | Status: DC | PRN
  Administered 2021-12-29: 5 mg via INTRAVENOUS
  Filled 2021-12-28: qty 4

## 2021-12-28 MED ORDER — ACETAMINOPHEN 325 MG PO TABS
650.0000 mg | ORAL_TABLET | Freq: Four times a day (QID) | ORAL | Status: DC | PRN
  Administered 2021-12-29 – 2021-12-30 (×3): 650 mg via ORAL
  Filled 2021-12-28 (×3): qty 2

## 2021-12-28 MED ORDER — LACTATED RINGERS IV BOLUS
1000.0000 mL | Freq: Once | INTRAVENOUS | Status: AC
Start: 2021-12-28 — End: 2021-12-28
  Administered 2021-12-28: 1000 mL via INTRAVENOUS

## 2021-12-28 NOTE — H&P (Incomplete)
History and Physical   Charlesa Ehle Mikelson NAT:[id number] DOB: [birth year] DOA: 12/28/2021  PCP: Pcp, No   Patient coming from: Home  Chief Complaint: Abdominal pain  HPI: Veronica Calhoun is a 36 y.o. female with medical history significant of alcohol use disorder, hypertension, anxiety, depression presenting with abdominal pain, nausea, vomiting.  Patient reports 4 days of abdominal pain that has been constant with gradual increasing.  Seems to be worse with laying down flat improves somewhat with sitting up and holding her abdomen.  Described as a pressure-like pain primarily in her right upper quadrant with some radiation to her epigastrium right lower quadrant and periumbilical region.  She reports decreased p.o. intake and this time.  Including her medications.  She does continue to drink regularly with her last drink reportedly being couple days ago.  Has gone through detox in the past and had withdrawal symptoms at times requiring medication.  She denies fevers, chills, chest pain, shortness of breath, constipation, diarrhea.  ED Course: Vital signs in the ED significant for heart rate in the 90s to 110s, blood pressure in the 170s to 220s systolic, respiratory rate in the teens to 20s.  Lab work-up included CMP with sodium 134, glucose 123, calcium 8.6, AST 70 which is decreased in previous, ALT 82 which is decreased in previous, T. bili 1.4.  CBC with leukocytosis to 12.2.  Lipase elevated in the 80s.  Urinalysis with ketones, protein, rare bacteria.  UDS with opiates only.  Right parotid ultrasound showing pericholecystic fluid but otherwise normal, hepatic steatosis.  CT of the abdomen pelvis showed mild fat stranding at the duodenum and head of the pancreas which could be consistent with groove pancreatitis or duodenitis.  Patient received Dilaudid, morphine, hydralazine, labetalol, Ativan, Zofran and a liter of fluids in the ED.  Admission requested for pancreatitis versus duodenitis and  hypertensive urgency.  Review of Systems: As per HPI otherwise all other systems reviewed and are negative.  Past Medical History:  Diagnosis Date   Anxiety 2013   Generalized anxiety disorder 04/01/2013   Hypertension 2014    Past Surgical History:  Procedure Laterality Date   CESAREAN SECTION  03/18/2007    Social History  reports that she has been smoking cigarettes. She has a 5.00 pack-year smoking history. She has never used smokeless tobacco. She reports that she does not currently use alcohol after a past usage of about 10.0 standard drinks of alcohol per week. She reports that she does not use drugs.  Allergies  Allergen Reactions   Lisinopril Swelling    Reports Lip swelling   Amlodipine Other (See Comments)    "like I was going to pass out"    Family History  Problem Relation Age of Onset   Heart murmur Father    AAA (abdominal aortic aneurysm) Sister    Urinary tract infection Daughter    Hypertension Maternal Grandmother   Reviewed on admission  Prior to Admission medications   Medication Sig Start Date End Date Taking? Authorizing Provider  Aspirin-Salicylamide-Caffeine (BC HEADACHE POWDER PO) Take 1 packet by mouth every 6 (six) hours as needed (pain/headache).   Yes [provider]  bismuth subsalicylate (PEPTO BISMOL) 262 MG chewable tablet Chew 524 mg by mouth 2 (two) times daily as needed for indigestion (stomach pain).   Yes [provider]  sodium phosphate (FLEET) 7-19 GM/118ML ENEM Place 1 enema rectally once.   Yes [provider]  triamcinolone cream (KENALOG) 0.1 % Apply 1 application  topically daily as needed (rash/itching). 07/11/21  Yes [provider]  cetirizine (ZYRTEC) 10 MG tablet Take 10 mg by mouth daily. Patient not taking: Reported on 12/28/2021 11/01/21   [provider]  cloNIDine (CATAPRES) 0.2 MG tablet Take 0.2 mg by mouth 2 (two) times daily. Patient not taking: Reported on 12/28/2021  12/26/21   [provider]  hydrochlorothiazide (HYDRODIURIL) 25 MG tablet Take 1 tablet (25 mg total) by mouth daily. Patient not taking: Reported on 12/28/2021 05/30/19   Julieanne Manson, MD  metoprolol tartrate (LOPRESSOR) 25 MG tablet Take 25 mg by mouth 2 (two) times daily. Patient not taking: Reported on 12/28/2021 11/01/21   [provider]  Naltrexone (VIVITROL) 380 MG SUSR Inject 380 mg into the muscle every 28 (twenty-eight) days. Patient not taking: Reported on 12/28/2021    [provider]    Physical Exam: Vitals:   12/28/21 2245 12/28/21 2300 12/28/21 2330 12/28/21 2345  BP: (!) 213/137 (!) 174/160 (!) 169/109 (!) 162/107  Pulse: (!) 119 (!) 115 (!) 106 (!) 107  Resp: 20 (!) 22 (!) 21 18  Temp: 98 F (36.7 C)     TempSrc: Oral     SpO2: 98% 98% 94% 91%  Weight:      Height:        Physical Exam Constitutional:      General: She is not in acute distress.    Appearance: Normal appearance. She is obese.  HENT:     Head: Normocephalic and atraumatic.     Mouth/Throat:     Mouth: Mucous membranes are moist.     Pharynx: Oropharynx is clear.  Eyes:     Extraocular Movements: Extraocular movements intact.     Pupils: Pupils are equal, round, and reactive to light.  Cardiovascular:     Rate and Rhythm: Regular rhythm. Tachycardia present.     Pulses: Normal pulses.     Heart sounds: Normal heart sounds.  Pulmonary:     Effort: Pulmonary effort is normal. No respiratory distress.     Breath sounds: Normal breath sounds.  Abdominal:     General: Bowel sounds are normal. There is no distension.     Palpations: Abdomen is soft.     Tenderness: There is abdominal tenderness in the epigastric area.  Musculoskeletal:        General: No swelling or deformity.  Skin:    General: Skin is warm and dry.  Neurological:     General: No focal deficit present.     Mental Status: Mental status is at baseline.    Labs on Admission: I have personally  reviewed following labs and imaging studies  CBC: Recent Labs  Lab 12/28/21 1250 12/28/21 1256  WBC 12.2*  --   NEUTROABS 9.6*  --   HGB 15.4* 15.3*  HCT 43.4 45.0  MCV 95.8  --   PLT 239  --     Basic Metabolic Panel: Recent Labs  Lab 12/28/21 1250 12/28/21 1256  NA 134* 136  K 3.7 3.7  CL 101 99  CO2 24  --   GLUCOSE 123* 125*  BUN 7 4*  CREATININE 0.54 0.50  CALCIUM 9.6  --     GFR: Estimated Creatinine Clearance: 128.1 mL/min (by C-G formula based on SCr of 0.5 mg/dL).  Liver Function Tests: Recent Labs  Lab 12/28/21 1250  AST 70*  ALT 82*  ALKPHOS 108  BILITOT 1.4*  PROT 8.6*  ALBUMIN 4.3    Urine analysis:  Component Value Date/Time   COLORURINE YELLOW 12/28/2021 1240   APPEARANCEUR CLEAR 12/28/2021 1240   LABSPEC 1.005 12/28/2021 1240   PHURINE 6.0 12/28/2021 1240   GLUCOSEU NEGATIVE 12/28/2021 1240   HGBUR NEGATIVE 12/28/2021 1240   BILIRUBINUR NEGATIVE 12/28/2021 1240   BILIRUBINUR neg 09/02/2017 1457   KETONESUR 5 (A) 12/28/2021 1240   PROTEINUR 100 (A) 12/28/2021 1240   UROBILINOGEN 0.2 10/16/2019 1500   NITRITE NEGATIVE 12/28/2021 1240   LEUKOCYTESUR NEGATIVE 12/28/2021 1240    Radiological Exams on Admission: CT ABDOMEN PELVIS W CONTRAST  Result Date: 12/28/2021 CLINICAL DATA:  Right lower quadrant abdominal pain EXAM: CT ABDOMEN AND PELVIS WITH CONTRAST TECHNIQUE: Multidetector CT imaging of the abdomen and pelvis was performed using the standard protocol following bolus administration of intravenous contrast. RADIATION DOSE REDUCTION: This exam was performed according to the departmental dose-optimization program which includes automated exposure control, adjustment of the mA and/or kV according to patient size and/or use of iterative reconstruction technique. CONTRAST:  OMNIPAQUE IOHEXOL 300 MG/ML  SOLN COMPARISON:  None Available. FINDINGS: Lower chest: No acute abnormality. Hepatobiliary: Mild hepatic steatosis. No focal  liver abnormality is seen. No gallstones, gallbladder wall thickening, or biliary dilatation. Pancreas: Mild fat stranding centered about the duodenum and head of the pancreas. No pancreatic ductal dilatation or surrounding inflammatory changes. Spleen: Normal in size without focal abnormality. Adrenals/Urinary Tract: Adrenal glands are unremarkable. Kidneys are normal, without renal calculi, focal lesion, or hydronephrosis. Bladder is unremarkable. Stomach/Bowel: Stomach is within normal limits. Mild periduodenal fat stranding as above. Appendix appears normal. No evidence of bowel wall thickening or obstruction. Vascular/Lymphatic: No significant vascular findings are present. No enlarged abdominal or pelvic lymph nodes. Reproductive: Uterus and bilateral adnexa are unremarkable. Other: No abdominal wall hernia or abnormality. No abdominopelvic ascites. Musculoskeletal: No acute or significant osseous findings. IMPRESSION: Mild fat stranding centered about the duodenum and head of the pancreas, differential considerations include duodenitis or groove pancreatitis. Electronically Signed   By: Allegra Lai M.D.   On: 12/28/2021 16:24   US Abdomen Limited RUQ (LIVER/GB)  Result Date: 12/28/2021 CLINICAL DATA:  Right upper quadrant pain EXAM: ULTRASOUND ABDOMEN LIMITED RIGHT UPPER QUADRANT COMPARISON:  None Available. FINDINGS: Gallbladder: No gallstones or wall thickening visualized. Possible pericholecystic fluid. No sonographic Murphy sign noted by sonographer. Common bile duct: Diameter: 3 mm Liver: No focal lesion identified. Increased parenchymal echogenicity. Portal vein is patent on color Doppler imaging with normal direction of blood flow towards the liver. Other: Limited exam due to patient motion, overlying bowel gas and patient body habitus. IMPRESSION: 1. Possible pericholecystic fluid, a nonspecific finding. Otherwise, normal sonographic appearance of the gallbladder. 2. Hepatic steatosis.  Electronically Signed   By: Allegra Lai M.D.   On: 12/28/2021 14:03    EKG: Not performed in the emergency department  Assessment/Plan Principal Problem:   Pancreatitis Active Problems:   Essential (primary) hypertension   Alcohol use disorder, severe, dependence (HCC)   Leukocytosis   Obesity, Class III, BMI 40-49.9 (morbid obesity) (HCC)   Pancreatitis versus duodenitis Leukocytosis > Patient presenting with 4 days of abdominal pain at the right upper quadrant epigastrium radiating to the right lower quadrant and periumbilical region.  Constant severe worse with laying down associated with nausea and vomiting, leading to decreased p.o. intake. > In the ED ultrasound showed some pericholecystic fluid but otherwise normal gallbladder.  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  Was noted  to have mild leukocytosis 12.2 > Patient received pain medication, Zofran, liter fluids in the ED. > We will treat for pancreatitis which will largely cover treatments for duodenitis as well.  Does have history of significant alcohol use is possible etiology. - Monitor on telemetry - Continue IV fluids - Bowel rest with clear liquid diet only - As needed pain control - Supportive care - Trend WBC  Hypertensive urgency > Elevated blood pressure in the setting of not taking home medications due to her nausea vomiting and abdominal pain.  Is prescribed clonidine. - Restart clonidine, hydrochlorothiazide, metoprolol when tolerating p.o. - As needed labetalol IV for systolic blood pressure greater than 190 for now.  Alcohol use disorder > Reports last drink was 2 days ago.  Has had mild withdrawal symptoms in the past requiring medication during previous detox. - CIWA with Ativan - Multivitamin, folate, thiamine  DVT prophylaxis: Lovenox Code Status:   Full Family Communication:  None on admission.  Patient states her  family is aware of her admission. Disposition Plan:   Patient is from:  Home  Anticipated DC to:  Home  Anticipated DC date:  1 to 3 days  Anticipated DC barriers: None  Consults called:  None Admission status:  Observation, telemetry  Severity of Illness: The appropriate patient status for this patient is OBSERVATION. Observation status is judged to be reasonable and necessary in order to provide the required intensity of service to ensure the patient's safety. The patient's presenting symptoms, physical exam findings, and initial radiographic and laboratory data in the context of their medical condition is felt to place them at decreased risk for further clinical deterioration. Furthermore, it is anticipated that the patient will be medically stable for discharge from the hospital within 2 midnights of admission.    Synetta Fail MD [facility]  How to contact the [facility] Attending or Consulting provider 7A - 7P or covering provider during after hours 7P -7A, for this patient?   Check the care team in [facility] and look for a) attending/consulting [facility] provider listed and b) the [facility] team listed Log into [email or web address] and use 's universal password to access. If you do not have the password, please contact the hospital operator. Locate the [facility] provider you are looking for under [facility] and page to a number that you can be directly reached. If you still have difficulty reaching the provider, please page the [facility] (Director on Call) for the Hospitalists listed on amion for assistance.  12/29/2021, 12:07 AM

## 2021-12-28 NOTE — ED Provider Triage Note (Addendum)
Emergency Medicine Provider Triage Evaluation Note  Veronica Calhoun , a 36 y.o. female  was evaluated in triage.  Pt complains of RUQ abdominal pain with nausea and vomiting for the past 4 days. She reports that feel feels weak because she has not been able to keep anything down.  Review of Systems  Positive:  Negative:   Physical Exam  BP (!) 211/133 (BP Location: Left Arm)   Pulse 97   Temp 98.9 F (37.2 C) (Oral)   Resp (!) 22   Ht 5\' 6"  (1.676 m)   Wt 113.9 kg   SpO2 99%   BMI 40.53 kg/m  Gen:   Awake, no distress   Resp:  Normal effort  MSK:   Moves extremities without difficulty  Other:  Mild RUQ tenderness to palpation. Abdomen soft without guarding or rebound.   Medical Decision Making  Medically screening exam initiated at 12:38 PM.  Appropriate orders placed.  Veronica Calhoun was informed that the remainder of the evaluation will be completed by another provider, this initial triage assessment does not replace that evaluation, and the importance of remaining in the ED until their evaluation is complete.  Veronica Calhoun ordered with labs. BP extremely elevated. Asymptomatic. Has not taken medication in over 2 weeks.    [place], PA-C 12/28/21 1239    12/30/21, PA-C 12/28/21 1240

## 2021-12-28 NOTE — ED Provider Notes (Signed)
[facility]-EMERGENCY DEPT Provider Note   CSN: [id number] Arrival date & time: 12/28/21  1145     History {Add pertinent medical, surgical, social history, OB history to HPI:1} Chief Complaint  Patient presents with   Abdominal Pain    Veronica Calhoun is a 36 y.o. female.  HPI     36 year old female with a history of hypertension, generalized anxiety disorder, alcohol dependence who presents with concern for abdominal pain, nausea and vomiting.   Reports abdominal pain began 4 days ago. Like a pressure like pain to RUQ, sometimes feeling it RLQ, periumbilical and epigastric area.  Just a constant pain, severe. Somewhat better sitting up and holding abdomen.  Worse laying down. Cannot tell if eating changes it. Began to have nausea and vomiting yesterday.  Had some constipation, took enema yesterday with small bm last night.  Has not been able to keep things down. Has not had alcohol over last 2 days due to nausea and vomiting. Has some mild shakiness but not sure if due to pain.  Denies fevers, dysuria, chest pain, shortness of breath, numbness, weakness, vaginal discharge or bleeding.  She has not been taking her blood pressure medications.  Only prior surgery was C-section.  She has a family history of gallbladder disease.   Home Medications Prior to Admission medications   Medication Sig Start Date End Date Taking? Authorizing Provider  benzonatate (TESSALON) 100 MG capsule Take 1 capsule (100 mg total) by mouth 2 (two) times daily as needed for cough. Patient not taking: Reported on 07/19/2021 07/16/21   Freddy Finner, NP  FLUoxetine (PROZAC) 20 MG capsule Take 1 capsule (20 mg total) by mouth daily. Patient not taking: Reported on 07/19/2021 05/30/19   Julieanne Manson, MD  fluticasone [facility]) 50 MCG/ACT nasal spray Place 2 sprays into both nostrils daily. Patient not taking: Reported on 07/19/2021 05/13/20   Jannifer Rodney A, FNP  hydrALAZINE  (APRESOLINE) 25 MG tablet Take 1 tablet (25 mg total) by mouth 2 (two) times daily. Patient not taking: Reported on 07/19/2021 04/15/19   Julieanne Manson, MD  hydrochlorothiazide (HYDRODIURIL) 25 MG tablet Take 1 tablet (25 mg total) by mouth daily. Patient taking differently: Take 12.5-25 mg by mouth See admin instructions. 12.5 mg qd x 7 days, then 25 mg qd 05/30/19   Julieanne Manson, MD  hydrOXYzine (ATARAX) 25 MG tablet Take 25 mg by mouth 2 (two) times daily as needed for itching. 07/11/21   [provider]  imiquimod (ALDARA) 5 % cream Apply topically 3 (three) times a week. Apply until total clearance or maximum of 16 weeks Patient not taking: Reported on 07/19/2021 07/25/19   Hermina Staggers, MD  metoprolol tartrate (LOPRESSOR) 50 MG tablet Take 0.5 tablets (25 mg total) by mouth 2 (two) times daily. 07/20/21 08/19/21  Kommor, [place], MD  sertraline (ZOLOFT) 50 MG tablet Take 50 mg by mouth daily. 07/11/21   [provider]  spironolactone (ALDACTONE) 25 MG tablet Take 1 tablet (25 mg total) by mouth daily. Patient not taking: Reported on 07/19/2021 10/16/19   Dahlia Byes A, NP  triamcinolone cream (KENALOG) 0.1 % Apply 1 application. topically daily as needed. 07/11/21   [provider]      Allergies    Lisinopril and Amlodipine    Review of Systems   Review of Systems  Physical Exam Updated Vital Signs BP (!) 220/132   Pulse 86   Temp 98.7 F (37.1 C)   Resp (!) 22  Ht 5\' 6"  (1.676 m)   Wt 113.9 kg   SpO2 99%   BMI 40.53 kg/m  Physical Exam Vitals and nursing note reviewed.  Constitutional:      General: She is not in acute distress.    Appearance: She is well-developed. She is not diaphoretic.  HENT:     Head: Normocephalic and atraumatic.  Eyes:     Conjunctiva/sclera: Conjunctivae normal.  Cardiovascular:     Rate and Rhythm: Normal rate and regular rhythm.     Heart sounds: Normal heart sounds. No murmur heard.    No friction rub. No  gallop.  Pulmonary:     Effort: Pulmonary effort is normal. No respiratory distress.     Breath sounds: Normal breath sounds. No wheezing or rales.  Abdominal:     General: There is no distension.     Palpations: Abdomen is soft.     Tenderness: There is abdominal tenderness in the right upper quadrant, right lower quadrant and epigastric area. There is no guarding.  Musculoskeletal:        General: No tenderness.     Cervical back: Normal range of motion.  Skin:    General: Skin is warm and dry.     Findings: No erythema or rash.  Neurological:     Mental Status: She is alert and oriented to person, place, and time.     ED Results / Procedures / Treatments   Labs (all labs ordered are listed, but only abnormal results are displayed) Labs Reviewed  CBC WITH DIFFERENTIAL/PLATELET - Abnormal; Notable for the following components:      Result Value   WBC 12.2 (*)    Hemoglobin 15.4 (*)    RDW 16.0 (*)    Neutro Abs 9.6 (*)    Abs Immature Granulocytes 0.10 (*)    All other components within normal limits  LIPASE, BLOOD - Abnormal; Notable for the following components:   Lipase 86 (*)    All other components within normal limits  I-STAT CHEM 8, ED - Abnormal; Notable for the following components:   BUN 4 (*)    Glucose, Bld 125 (*)    Hemoglobin 15.3 (*)    All other components within normal limits  URINALYSIS, ROUTINE W REFLEX MICROSCOPIC  COMPREHENSIVE METABOLIC PANEL  RAPID URINE DRUG SCREEN, HOSP PERFORMED  I-STAT BETA HCG BLOOD, ED ([facility], [facility], [facility] ONLY)    EKG None  Radiology No results found.  Procedures Procedures  {Document cardiac monitor, telemetry assessment procedure when appropriate:1}  Medications Ordered in ED Medications  hydrALAZINE (APRESOLINE) injection 5 mg (has no administration in time range)  HYDROmorphone (DILAUDID) injection 1 mg (has no administration in time range)  ondansetron (ZOFRAN) injection 4 mg (has no administration in time range)   lactated ringers bolus 1,000 mL (has no administration in time range)    ED Course/ Medical Decision Making/ A&P                           Medical Decision Making Amount and/or Complexity of Data Reviewed Labs: ordered. Radiology: ordered.  Risk Prescription drug management.   36 year old female with a history of hypertension, generalized anxiety disorder, alcohol dependence who presents with concern for abdominal pain, nausea and vomiting.  DDx includes appendicitis, pancreatitis, cholecystitis, pyelonephritis, nephrolithiasis, diverticulitis, PID, ovarian torsion, ectopic pregnancy, and tuboovarian abscess, gastritis, cholelithiasis, PUD, perforated ulcer, dissection.  Not having significant or colicky lower abdominal pain and doubt ovarian  torsion, nephrolithiasis, no discharge doubt PID/TOA. Has significant htn but normal pulses and do not feel history and exam are consistent with dissection.    {Document critical care time when appropriate:1} {Document review of labs and clinical decision tools ie heart score, Chads2Vasc2 etc:1}  {Document your independent review of radiology images, and any outside records:1} {Document your discussion with family members, caretakers, and with consultants:1} {Document social determinants of health affecting pt's care:1} {Document your decision making why or why not admission, treatments were needed:1} Final Clinical Impression(s) / ED Diagnoses Final diagnoses:  Right upper quadrant abdominal pain  Nausea and vomiting, unspecified vomiting type  Hypertension, unspecified type    Rx / DC Orders ED Discharge Orders     None

## 2021-12-28 NOTE — ED Triage Notes (Signed)
Pt arrived via POV, c/o abd pain, and vomiting. Denies diarrhea, fevers, or urinary sx.

## 2021-12-29 DIAGNOSIS — I160 Hypertensive urgency: Secondary | ICD-10-CM | POA: Diagnosis present

## 2021-12-29 DIAGNOSIS — Z6841 Body Mass Index (BMI) 40.0 and over, adult: Secondary | ICD-10-CM | POA: Diagnosis not present

## 2021-12-29 DIAGNOSIS — R1011 Right upper quadrant pain: Secondary | ICD-10-CM | POA: Diagnosis present

## 2021-12-29 DIAGNOSIS — F411 Generalized anxiety disorder: Secondary | ICD-10-CM | POA: Diagnosis present

## 2021-12-29 DIAGNOSIS — I10 Essential (primary) hypertension: Secondary | ICD-10-CM | POA: Diagnosis present

## 2021-12-29 DIAGNOSIS — E871 Hypo-osmolality and hyponatremia: Secondary | ICD-10-CM | POA: Diagnosis present

## 2021-12-29 DIAGNOSIS — Z8249 Family history of ischemic heart disease and other diseases of the circulatory system: Secondary | ICD-10-CM | POA: Diagnosis not present

## 2021-12-29 DIAGNOSIS — F17210 Nicotine dependence, cigarettes, uncomplicated: Secondary | ICD-10-CM | POA: Diagnosis present

## 2021-12-29 DIAGNOSIS — K859 Acute pancreatitis without necrosis or infection, unspecified: Secondary | ICD-10-CM | POA: Diagnosis present

## 2021-12-29 DIAGNOSIS — Z888 Allergy status to other drugs, medicaments and biological substances status: Secondary | ICD-10-CM | POA: Diagnosis not present

## 2021-12-29 DIAGNOSIS — Z91148 Patient's other noncompliance with medication regimen for other reason: Secondary | ICD-10-CM | POA: Diagnosis not present

## 2021-12-29 DIAGNOSIS — E876 Hypokalemia: Secondary | ICD-10-CM | POA: Diagnosis not present

## 2021-12-29 DIAGNOSIS — K852 Alcohol induced acute pancreatitis without necrosis or infection: Secondary | ICD-10-CM | POA: Diagnosis present

## 2021-12-29 DIAGNOSIS — T465X6A Underdosing of other antihypertensive drugs, initial encounter: Secondary | ICD-10-CM | POA: Diagnosis present

## 2021-12-29 DIAGNOSIS — D72829 Elevated white blood cell count, unspecified: Secondary | ICD-10-CM | POA: Diagnosis present

## 2021-12-29 DIAGNOSIS — Z79899 Other long term (current) drug therapy: Secondary | ICD-10-CM | POA: Diagnosis not present

## 2021-12-29 DIAGNOSIS — F1020 Alcohol dependence, uncomplicated: Secondary | ICD-10-CM | POA: Diagnosis present

## 2021-12-29 HISTORY — DX: Acute pancreatitis without necrosis or infection, unspecified: K85.90

## 2021-12-29 LAB — COMPREHENSIVE METABOLIC PANEL
ALT: 66 U/L — ABNORMAL HIGH (ref 0–44)
AST: 52 U/L — ABNORMAL HIGH (ref 15–41)
Albumin: 3.6 g/dL (ref 3.5–5.0)
Alkaline Phosphatase: 90 U/L (ref 38–126)
Anion gap: 8 (ref 5–15)
BUN: 5 mg/dL — ABNORMAL LOW (ref 6–20)
CO2: 25 mmol/L (ref 22–32)
Calcium: 9.1 mg/dL (ref 8.9–10.3)
Chloride: 102 mmol/L (ref 98–111)
Creatinine, Ser: 0.54 mg/dL (ref 0.44–1.00)
GFR, Estimated: >60 mL/min (ref >60)
Glucose, Bld: 115 mg/dL — ABNORMAL HIGH (ref 70–99)
Potassium: 3.3 mmol/L — ABNORMAL LOW (ref 3.5–5.1)
Sodium: 135 mmol/L (ref 135–145)
Total Bilirubin: 1.3 mg/dL — ABNORMAL HIGH (ref 0.3–1.2)
Total Protein: 7.8 g/dL (ref 6.5–8.1)

## 2021-12-29 LAB — CBC
HCT: 42.8 % (ref 36.0–46.0)
Hemoglobin: 14.8 g/dL (ref 12.0–15.0)
MCH: 33.9 pg (ref 26.0–34.0)
MCHC: 34.6 g/dL (ref 30.0–36.0)
MCV: 98.2 fL (ref 80.0–100.0)
Platelets: 230 10*3/uL (ref 150–400)
RBC: 4.36 MIL/uL (ref 3.87–5.11)
RDW: 16.2 % — ABNORMAL HIGH (ref 11.5–15.5)
WBC: 14 10*3/uL — ABNORMAL HIGH (ref 4.0–10.5)
nRBC: 0 % (ref 0.0–0.2)

## 2021-12-29 LAB — HIV ANTIBODY (ROUTINE TESTING W REFLEX): HIV Screen 4th Generation wRfx: NONREACTIVE

## 2021-12-29 MED ORDER — POTASSIUM CHLORIDE CRYS ER 20 MEQ PO TBCR
40.0000 meq | EXTENDED_RELEASE_TABLET | Freq: Once | ORAL | Status: AC
  Administered 2021-12-29: 40 meq via ORAL
  Filled 2021-12-29: qty 2

## 2021-12-29 MED ORDER — PANTOPRAZOLE SODIUM 40 MG IV SOLR
40.0000 mg | Freq: Two times a day (BID) | INTRAVENOUS | Status: DC
  Administered 2021-12-29 – 2021-12-30 (×3): 40 mg via INTRAVENOUS
  Filled 2021-12-29 (×3): qty 10

## 2021-12-29 MED ORDER — THIAMINE HCL 100 MG PO TABS
100.0000 mg | ORAL_TABLET | Freq: Every day | ORAL | Status: DC
  Administered 2021-12-29 – 2021-12-30 (×2): 100 mg via ORAL
  Filled 2021-12-29 (×2): qty 1

## 2021-12-29 MED ORDER — LORAZEPAM 1 MG PO TABS
1.0000 mg | ORAL_TABLET | ORAL | Status: DC | PRN
  Administered 2021-12-29 (×3): 1 mg via ORAL
  Filled 2021-12-29 (×3): qty 1

## 2021-12-29 MED ORDER — FOLIC ACID 1 MG PO TABS
1.0000 mg | ORAL_TABLET | Freq: Every day | ORAL | Status: DC
  Administered 2021-12-29 – 2021-12-30 (×2): 1 mg via ORAL
  Filled 2021-12-29 (×2): qty 1

## 2021-12-29 MED ORDER — CLONIDINE HCL 0.2 MG PO TABS
0.2000 mg | ORAL_TABLET | Freq: Two times a day (BID) | ORAL | Status: DC
  Administered 2021-12-29 – 2021-12-30 (×3): 0.2 mg via ORAL
  Filled 2021-12-29 (×3): qty 1

## 2021-12-29 MED ORDER — LORAZEPAM 2 MG/ML IJ SOLN: 1.0000 mg | INTRAMUSCULAR | Status: DC | PRN

## 2021-12-29 MED ORDER — METOPROLOL TARTRATE 25 MG PO TABS
25.0000 mg | ORAL_TABLET | Freq: Two times a day (BID) | ORAL | Status: DC
  Administered 2021-12-29 – 2021-12-30 (×3): 25 mg via ORAL
  Filled 2021-12-29 (×3): qty 1

## 2021-12-29 MED ORDER — THIAMINE HCL 100 MG/ML IJ SOLN: 100.0000 mg | Freq: Every day | INTRAMUSCULAR | Status: DC

## 2021-12-29 MED ORDER — ADULT MULTIVITAMIN W/MINERALS CH
1.0000 | ORAL_TABLET | Freq: Every day | ORAL | Status: DC
  Administered 2021-12-29 – 2021-12-30 (×2): 1 via ORAL
  Filled 2021-12-29 (×2): qty 1

## 2021-12-29 NOTE — Progress Notes (Addendum)
PROGRESS NOTE    Veronica Calhoun  ULA:[id number] DOB: 07-Jan-1986 DOA: 12/28/2021 PCP: Pcp, No   Brief Narrative:  36 year old female with history of alcohol abuse, hypertension, anxiety, depression presented with abdominal pain, nausea and vomiting.  On presentation, LFTs were mildly elevated, lipase was in the 80s.  UDS was positive for opiates only.  Right upper quadrant ultrasound showed pericholecystic fluid but otherwise normal, hepatic steatosis.  CT of the abdomen and pelvis showed possible acute pancreatitis or duodenitis.  She was started on IV fluids and analgesics.  Assessment & Plan:   Possible acute pancreatitis, most likely alcohol induced Possible duodenitis -As per imaging.  Continue IV fluids, IV analgesics and antiemetics as needed.  Continue clear liquid diet for today.  Alcohol abuse -Counseled regarding abstinence.  CIWA protocol.  Continue multivitamin, thiamine and folate  Hypertensive urgency -Has not taken her home blood pressure medications for 2 weeks now and does not give any reason.  Blood pressure still elevated.  Resume home regimen.  Obesity -Outpatient follow-up   leukocytosis -Possibly reactive  Hypokalemia -Replace.  Repeat a.m. labs  Hyponatremia -Improved  Elevated LFTs -Possibly from alcohol abuse.  Mild.  Monitor.   DVT prophylaxis: Lovenox Code Status: Full Family Communication: None at bedside Disposition Plan: Status is: Observation The patient will require care spanning > 2 midnights and should be moved to inpatient because: Of need for IV fluids/analgesics    Consultants: None  Procedures: None  Antimicrobials: None   Subjective: Patient seen and examined at bedside.  Complains of abdominal pain and some nausea.  No overnight fever, seizures, chest pain reported.  Objective: Vitals:   12/29/21 0228 12/29/21 0319 12/29/21 0653 12/29/21 1013  BP: (!) 167/116  (!) 186/115 (!) 203/129  Pulse: (!) 101  (!) 107 95   Resp: 18   17  Temp: (!) 97.5 F (36.4 C)  97.8 F (36.6 C) (!) 97.5 F (36.4 C)  TempSrc: Oral  Oral Oral  SpO2: 99%  98% 100%  Weight:  113 kg    Height:  5\' 7"  (1.702 m)      Intake/Output Summary (Last 24 hours) at 12/29/2021 1024 Last data filed at 12/29/2021 0548 Gross per 24 hour  Intake 1806 ml  Output --  Net 1806 ml   Filed Weights   12/28/21 1205 12/29/21 0319  Weight: 113.9 kg 113 kg    Examination:  General exam: Appears calm and comfortable.  On room air. Respiratory system: Bilateral decreased breath sounds at bases with scattered crackles Cardiovascular system: S1 & S2 heard, tach cardiac Gastrointestinal system: Abdomen is obese, distended soft and tender in the upper quadrant.  Normal bowel sounds heard. Extremities: No cyanosis, clubbing, edema  Central nervous system: Alert and oriented. No focal neurological deficits. Moving extremities Skin: No rashes, lesions or ulcers Psychiatry: Judgement and insight appear normal. Mood & affect appropriate.     Data Reviewed: I have personally reviewed following labs and imaging studies  CBC: Recent Labs  Lab 12/28/21 1250 12/28/21 1256 12/29/21 0346  WBC 12.2*  --  14.0*  NEUTROABS 9.6*  --   --   HGB 15.4* 15.3* 14.8  HCT 43.4 45.0 42.8  MCV 95.8  --  98.2  PLT 239  --  230   Basic Metabolic Panel: Recent Labs  Lab 12/28/21 1250 12/28/21 1256 12/29/21 0346  NA 134* 136 135  K 3.7 3.7 3.3*  CL 101 99 102  CO2 24  --  25  GLUCOSE  123* 125* 115*  BUN 7 4* 5*  CREATININE 0.54 0.50 0.54  CALCIUM 9.6  --  9.1   GFR: Estimated Creatinine Clearance: 129.8 mL/min (by C-G formula based on SCr of 0.54 mg/dL). Liver Function Tests: Recent Labs  Lab 12/28/21 1250 12/29/21 0346  AST 70* 52*  ALT 82* 66*  ALKPHOS 108 90  BILITOT 1.4* 1.3*  PROT 8.6* 7.8  ALBUMIN 4.3 3.6   Recent Labs  Lab 12/28/21 1250  LIPASE 86*   No results for input(s): "AMMONIA" in the last 168 hours. Coagulation  Profile: No results for input(s): "INR", "PROTIME" in the last 168 hours. Cardiac Enzymes: No results for input(s): "CKTOTAL", "CKMB", "CKMBINDEX", "TROPONINI" in the last 168 hours. BNP (last 3 results) No results for input(s): "PROBNP" in the last 8760 hours. HbA1C: No results for input(s): "HGBA1C" in the last 72 hours. CBG: No results for input(s): "GLUCAP" in the last 168 hours. Lipid Profile: No results for input(s): "CHOL", "HDL", "LDLCALC", "TRIG", "CHOLHDL", "LDLDIRECT" in the last 72 hours. Thyroid Function Tests: No results for input(s): "TSH", "T4TOTAL", "FREET4", "T3FREE", "THYROIDAB" in the last 72 hours. Anemia Panel: No results for input(s): "VITAMINB12", "FOLATE", "FERRITIN", "TIBC", "IRON", "RETICCTPCT" in the last 72 hours. Sepsis Labs: No results for input(s): "PROCALCITON", "LATICACIDVEN" in the last 168 hours.  No results found for this or any previous visit (from the past 240 hour(s)).       Radiology Studies: CT ABDOMEN PELVIS W CONTRAST  Result Date: 12/28/2021 CLINICAL DATA:  Right lower quadrant abdominal pain EXAM: CT ABDOMEN AND PELVIS WITH CONTRAST TECHNIQUE: Multidetector CT imaging of the abdomen and pelvis was performed using the standard protocol following bolus administration of intravenous contrast. RADIATION DOSE REDUCTION: This exam was performed according to the departmental dose-optimization program which includes automated exposure control, adjustment of the mA and/or kV according to patient size and/or use of iterative reconstruction technique. CONTRAST:  OMNIPAQUE IOHEXOL 300 MG/ML  SOLN COMPARISON:  None Available. FINDINGS: Lower chest: No acute abnormality. Hepatobiliary: Mild hepatic steatosis. No focal liver abnormality is seen. No gallstones, gallbladder wall thickening, or biliary dilatation. Pancreas: Mild fat stranding centered about the duodenum and head of the pancreas. No pancreatic ductal dilatation or surrounding inflammatory  changes. Spleen: Normal in size without focal abnormality. Adrenals/Urinary Tract: Adrenal glands are unremarkable. Kidneys are normal, without renal calculi, focal lesion, or hydronephrosis. Bladder is unremarkable. Stomach/Bowel: Stomach is within normal limits. Mild periduodenal fat stranding as above. Appendix appears normal. No evidence of bowel wall thickening or obstruction. Vascular/Lymphatic: No significant vascular findings are present. No enlarged abdominal or pelvic lymph nodes. Reproductive: Uterus and bilateral adnexa are unremarkable. Other: No abdominal wall hernia or abnormality. No abdominopelvic ascites. Musculoskeletal: No acute or significant osseous findings. IMPRESSION: Mild fat stranding centered about the duodenum and head of the pancreas, differential considerations include duodenitis or groove pancreatitis. Electronically Signed   By: Allegra Lai M.D.   On: 12/28/2021 16:24   US Abdomen Limited RUQ (LIVER/GB)  Result Date: 12/28/2021 CLINICAL DATA:  Right upper quadrant pain EXAM: ULTRASOUND ABDOMEN LIMITED RIGHT UPPER QUADRANT COMPARISON:  None Available. FINDINGS: Gallbladder: No gallstones or wall thickening visualized. Possible pericholecystic fluid. No sonographic Murphy sign noted by sonographer. Common bile duct: Diameter: 3 mm Liver: No focal lesion identified. Increased parenchymal echogenicity. Portal vein is patent on color Doppler imaging with normal direction of blood flow towards the liver. Other: Limited exam due to patient motion, overlying bowel gas and patient body habitus. IMPRESSION: 1.  Possible pericholecystic fluid, a nonspecific finding. Otherwise, normal sonographic appearance of the gallbladder. 2. Hepatic steatosis. Electronically Signed   By: Allegra Lai M.D.   On: 12/28/2021 14:03        Scheduled Meds:  enoxaparin (LOVENOX) injection  40 mg Subcutaneous Q24H   folic acid  1 mg Oral Daily   metoprolol tartrate  25 mg Oral BID    multivitamin with minerals  1 tablet Oral Daily   potassium chloride  40 mEq Oral Once   sodium chloride flush  3 mL Intravenous Q12H   thiamine  100 mg Oral Daily   Or   thiamine  100 mg Intravenous Daily   Continuous Infusions:  sodium chloride 125 mL/hr at 12/29/21 0240          Glade Lloyd, MD [facility] 12/29/2021, 10:24 AM

## 2021-12-29 NOTE — Plan of Care (Signed)
  Problem: Education: Goal: Knowledge of General Education information will improve Description: Including pain rating scale, medication(s)/side effects and non-pharmacologic comfort measures Outcome: Progressing   Problem: Clinical Measurements: Goal: Will remain free from infection Outcome: Progressing Goal: Diagnostic test results will improve Outcome: Progressing   Problem: Coping: Goal: Level of anxiety will decrease Outcome: Progressing   Problem: Pain Managment: Goal: General experience of comfort will improve Outcome: Progressing   Problem: Education: Goal: Knowledge of Pancreatitis treatment and prevention will improve Outcome: Progressing

## 2021-12-29 NOTE — H&P (Incomplete)
History and Physical   Veronica Calhoun [id number] DOB: 05-27-1985 DOA: 12/28/2021  PCP: Pcp, No   Patient coming from: Home  Chief Complaint: Abdominal pain  HPI: Veronica Calhoun is a 36 y.o. female with medical history significant of alcohol use disorder, hypertension, anxiety, depression presenting with abdominal pain, nausea, vomiting.  Patient reports 4 days of abdominal pain that has been constant with gradual increasing.  Seems to be worse with laying down flat improves somewhat with sitting up and holding her abdomen.  Described as a pressure-like pain primarily in her right upper quadrant with some radiation to her epigastrium right lower quadrant and periumbilical region.  She reports decreased p.o. intake and this time.  Including her medications.  She does continue to drink regularly with her last drink reportedly being couple days ago.  Has gone through detox in the past and had withdrawal symptoms at times requiring medication.  She denies fevers, chills, chest pain, shortness of breath, constipation, diarrhea.  ED Course: Vital signs in the ED significant for heart rate in the 90s to 110s, blood pressure in the [id number] to [id number] systolic, respiratory rate in the teens to 20s.  Lab work-up included CMP with sodium 134, glucose 123, calcium 8.6, AST 70 which is decreased in previous, ALT 82 which is decreased in previous, T. bili 1.4.  CBC with leukocytosis to 12.2.  Lipase elevated in the 80s.  Urinalysis with ketones, protein, rare bacteria.  UDS with opiates only.  Right parotid ultrasound showing pericholecystic fluid but otherwise normal, hepatic steatosis.  CT of the abdomen pelvis showed mild fat stranding at the duodenum and head of the pancreas which could be consistent with groove pancreatitis or duodenitis.  Patient received Dilaudid, morphine, hydralazine, labetalol, Ativan, Zofran and a liter of fluids in the ED.  Admission requested for pancreatitis versus duodenitis and  hypertensive urgency.  Review of Systems: As per HPI otherwise all other systems reviewed and are negative.  Past Medical History:  Diagnosis Date  . Anxiety 2013  . Generalized anxiety disorder 04/01/2013  . Hypertension 2014    Past Surgical History:  Procedure Laterality Date  . CESAREAN SECTION  03/18/2007    Social History  reports that she has been smoking cigarettes. She has a 5.00 pack-year smoking history. She has never used smokeless tobacco. She reports that she does not currently use alcohol after a past usage of about 10.0 standard drinks of alcohol per week. She reports that she does not use drugs.  Allergies  Allergen Reactions  . Lisinopril Swelling    Reports Lip swelling  . Amlodipine Other (See Comments)    "like I was going to pass out"    Family History  Problem Relation Age of Onset  . Heart murmur Father   . AAA (abdominal aortic aneurysm) Sister   . Urinary tract infection Daughter   . Hypertension Maternal Grandmother   Reviewed on admission  Prior to Admission medications   Medication Sig Start Date End Date Taking? Authorizing Provider  Aspirin-Salicylamide-Caffeine (BC HEADACHE POWDER PO) Take 1 packet by mouth every 6 (six) hours as needed (pain/headache).   Yes [provider]  bismuth subsalicylate (PEPTO BISMOL) 262 MG chewable tablet Chew 524 mg by mouth 2 (two) times daily as needed for indigestion (stomach pain).   Yes [provider]  sodium phosphate (FLEET) 7-19 GM/118ML ENEM Place 1 enema rectally once.   Yes [provider]  triamcinolone cream (KENALOG) 0.1 % Apply 1 application  topically daily as needed (rash/itching). 07/11/21  Yes [provider]  cetirizine (ZYRTEC) 10 MG tablet Take 10 mg by mouth daily. Patient not taking: Reported on 12/28/2021 11/01/21   [provider]  cloNIDine (CATAPRES) 0.2 MG tablet Take 0.2 mg by mouth 2 (two) times daily. Patient not taking: Reported on  12/28/2021 12/26/21   [provider]  hydrochlorothiazide (HYDRODIURIL) 25 MG tablet Take 1 tablet (25 mg total) by mouth daily. Patient not taking: Reported on 12/28/2021 05/30/19   Julieanne Manson, MD  metoprolol tartrate (LOPRESSOR) 25 MG tablet Take 25 mg by mouth 2 (two) times daily. Patient not taking: Reported on 12/28/2021 11/01/21   [provider]  Naltrexone (VIVITROL) 380 MG SUSR Inject 380 mg into the muscle every 28 (twenty-eight) days. Patient not taking: Reported on 12/28/2021    [provider]    Physical Exam: Vitals:   12/28/21 2200 12/28/21 2245 12/28/21 2300 12/28/21 2330  BP: (!) 213/121 (!) 213/137 (!) 174/160 (!) 169/109  Pulse: (!) 109 (!) 119 (!) 115 (!) 106  Resp: 18 20 (!) 22 (!) 21  Temp:  98 F (36.7 C)    TempSrc:  Oral    SpO2: 95% 98% 98% 94%  Weight:      Height:        Physical Exam Constitutional:      General: She is not in acute distress.    Appearance: Normal appearance. She is obese.  HENT:     Head: Normocephalic and atraumatic.     Mouth/Throat:     Mouth: Mucous membranes are moist.     Pharynx: Oropharynx is clear.  Eyes:     Extraocular Movements: Extraocular movements intact.     Pupils: Pupils are equal, round, and reactive to light.  Cardiovascular:     Rate and Rhythm: Regular rhythm. Tachycardia present.     Pulses: Normal pulses.     Heart sounds: Normal heart sounds.  Pulmonary:     Effort: Pulmonary effort is normal. No respiratory distress.     Breath sounds: Normal breath sounds.  Abdominal:     General: Bowel sounds are normal. There is no distension.     Palpations: Abdomen is soft.     Tenderness: There is no abdominal tenderness.  Musculoskeletal:        General: No swelling or deformity.  Skin:    General: Skin is warm and dry.  Neurological:     General: No focal deficit present.     Mental Status: Mental status is at baseline.    Labs on Admission: I have personally reviewed  following labs and imaging studies  CBC: Recent Labs  Lab 12/28/21 1250 12/28/21 1256  WBC 12.2*  --   NEUTROABS 9.6*  --   HGB 15.4* 15.3*  HCT 43.4 45.0  MCV 95.8  --   PLT 239  --     Basic Metabolic Panel: Recent Labs  Lab 12/28/21 1250 12/28/21 1256  NA 134* 136  K 3.7 3.7  CL 101 99  CO2 24  --   GLUCOSE 123* 125*  BUN 7 4*  CREATININE 0.54 0.50  CALCIUM 9.6  --     GFR: Estimated Creatinine Clearance: 128.1 mL/min (by C-G formula based on SCr of 0.5 mg/dL).  Liver Function Tests: Recent Labs  Lab 12/28/21 1250  AST 70*  ALT 82*  ALKPHOS 108  BILITOT 1.4*  PROT 8.6*  ALBUMIN 4.3    Urine analysis:  Component Value Date/Time   COLORURINE YELLOW 12/28/2021 1240   APPEARANCEUR CLEAR 12/28/2021 1240   LABSPEC 1.005 12/28/2021 1240   PHURINE 6.0 12/28/2021 1240   GLUCOSEU NEGATIVE 12/28/2021 1240   HGBUR NEGATIVE 12/28/2021 1240   BILIRUBINUR NEGATIVE 12/28/2021 1240   BILIRUBINUR neg 09/02/2017 1457   KETONESUR 5 (A) 12/28/2021 1240   PROTEINUR 100 (A) 12/28/2021 1240   UROBILINOGEN 0.2 10/16/2019 1500   NITRITE NEGATIVE 12/28/2021 1240   LEUKOCYTESUR NEGATIVE 12/28/2021 1240    Radiological Exams on Admission: CT ABDOMEN PELVIS W CONTRAST  Result Date: 12/28/2021 CLINICAL DATA:  Right lower quadrant abdominal pain EXAM: CT ABDOMEN AND PELVIS WITH CONTRAST TECHNIQUE: Multidetector CT imaging of the abdomen and pelvis was performed using the standard protocol following bolus administration of intravenous contrast. RADIATION DOSE REDUCTION: This exam was performed according to the departmental dose-optimization program which includes automated exposure control, adjustment of the mA and/or kV according to patient size and/or use of iterative reconstruction technique. CONTRAST:  OMNIPAQUE IOHEXOL 300 MG/ML  SOLN COMPARISON:  None Available. FINDINGS: Lower chest: No acute abnormality. Hepatobiliary: Mild hepatic steatosis. No focal liver  abnormality is seen. No gallstones, gallbladder wall thickening, or biliary dilatation. Pancreas: Mild fat stranding centered about the duodenum and head of the pancreas. No pancreatic ductal dilatation or surrounding inflammatory changes. Spleen: Normal in size without focal abnormality. Adrenals/Urinary Tract: Adrenal glands are unremarkable. Kidneys are normal, without renal calculi, focal lesion, or hydronephrosis. Bladder is unremarkable. Stomach/Bowel: Stomach is within normal limits. Mild periduodenal fat stranding as above. Appendix appears normal. No evidence of bowel wall thickening or obstruction. Vascular/Lymphatic: No significant vascular findings are present. No enlarged abdominal or pelvic lymph nodes. Reproductive: Uterus and bilateral adnexa are unremarkable. Other: No abdominal wall hernia or abnormality. No abdominopelvic ascites. Musculoskeletal: No acute or significant osseous findings. IMPRESSION: Mild fat stranding centered about the duodenum and head of the pancreas, differential considerations include duodenitis or groove pancreatitis. Electronically Signed   By: Allegra Lai M.D.   On: 12/28/2021 16:24   US Abdomen Limited RUQ (LIVER/GB)  Result Date: 12/28/2021 CLINICAL DATA:  Right upper quadrant pain EXAM: ULTRASOUND ABDOMEN LIMITED RIGHT UPPER QUADRANT COMPARISON:  None Available. FINDINGS: Gallbladder: No gallstones or wall thickening visualized. Possible pericholecystic fluid. No sonographic Murphy sign noted by sonographer. Common bile duct: Diameter: 3 mm Liver: No focal lesion identified. Increased parenchymal echogenicity. Portal vein is patent on color Doppler imaging with normal direction of blood flow towards the liver. Other: Limited exam due to patient motion, overlying bowel gas and patient body habitus. IMPRESSION: 1. Possible pericholecystic fluid, a nonspecific finding. Otherwise, normal sonographic appearance of the gallbladder. 2. Hepatic steatosis.  Electronically Signed   By: Allegra Lai M.D.   On: 12/28/2021 14:03    EKG: Not performed in the emergency department  Assessment/Plan Principal Problem:   Pancreatitis Active Problems:   Anxiety   Essential (primary) hypertension   Depression   Alcohol use disorder, severe, dependence (HCC)   Leukocytosis   Pancreatitis versus duodenitis Leukocytosis > Patient presenting with 4 days of abdominal pain at the right upper quadrant epigastrium radiating to the right lower quadrant and periumbilical region.  Constant severe worse with laying down associated with nausea and vomiting, leading to decreased p.o. intake. > In the ED ultrasound showed some pericholecystic fluid but otherwise normal gallbladder.  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  Was noted to have mild leukocytosis  12.2 > Patient received pain medication, Zofran, liter fluids in the ED. > We will treat for pancreatitis which will largely cover treatments for duodenitis as well.  Does have history of significant alcohol use is possible etiology. - Monitor on telemetry - Continue IV fluids - Bowel rest with clear liquid diet only - As needed pain control - Supportive care - Trend WBC  Hypertensive urgency > Elevated blood pressure in the setting of not taking home medications due to her nausea vomiting and abdominal pain.  Is prescribed clonidine. - Restart clonidine, hydrochlorothiazide, metoprolol when tolerating p.o. - As needed labetalol IV for systolic blood pressure greater than 190 for now.  Alcohol use disorder > Reports last drink was 2 days ago.  Has had mild withdrawal symptoms in the past requiring medication during previous detox. - CIWA with Ativan - Multivitamin, folate, thiamine  DVT prophylaxis: Lovenox Code Status:   Full Family Communication:  None on admission.  Patient states her family is aware of her  admission. Disposition Plan:   Patient is from:  Home  Anticipated DC to:  Home  Anticipated DC date:  1 to 3 days  Anticipated DC barriers: None  Consults called:  None Admission status:  Observation, telemetry  Severity of Illness: The appropriate patient status for this patient is OBSERVATION. Observation status is judged to be reasonable and necessary in order to provide the required intensity of service to ensure the patient's safety. The patient's presenting symptoms, physical exam findings, and initial radiographic and laboratory data in the context of their medical condition is felt to place them at decreased risk for further clinical deterioration. Furthermore, it is anticipated that the patient will be medically stable for discharge from the hospital within 2 midnights of admission.    Marcelyn Bruins MD [facility]  How to contact the [facility] Attending or Consulting provider [place] or covering provider during after hours [place], for this patient?   Check the care team in [facility] and look for a) attending/consulting [facility] provider listed and b) the [facility] team listed Log into [email or web address] and use [place]'s universal password to access. If you do not have the password, please contact the hospital operator. Locate the [facility] provider you are looking for under [facility] and page to a number that you can be directly reached. If you still have difficulty reaching the provider, please page the [facility] (Director on Call) for the Hospitalists listed on amion for assistance.  12/29/2021, 12:03 AM

## 2021-12-30 DIAGNOSIS — K852 Alcohol induced acute pancreatitis without necrosis or infection: Secondary | ICD-10-CM | POA: Diagnosis not present

## 2021-12-30 DIAGNOSIS — D72829 Elevated white blood cell count, unspecified: Secondary | ICD-10-CM | POA: Diagnosis not present

## 2021-12-30 DIAGNOSIS — I10 Essential (primary) hypertension: Secondary | ICD-10-CM | POA: Diagnosis not present

## 2021-12-30 DIAGNOSIS — F1020 Alcohol dependence, uncomplicated: Secondary | ICD-10-CM | POA: Diagnosis not present

## 2021-12-30 LAB — CBC WITH DIFFERENTIAL/PLATELET
Abs Immature Granulocytes: 0.07 10*3/uL (ref 0.00–0.07)
Basophils Absolute: 0.1 10*3/uL (ref 0.0–0.1)
Basophils Relative: 1 %
Eosinophils Absolute: 0.5 10*3/uL (ref 0.0–0.5)
Eosinophils Relative: 6 %
HCT: 38.2 % (ref 36.0–46.0)
Hemoglobin: 13 g/dL (ref 12.0–15.0)
Immature Granulocytes: 1 %
Lymphocytes Relative: 32 %
Lymphs Abs: 2.7 10*3/uL (ref 0.7–4.0)
MCH: 34.1 pg — ABNORMAL HIGH (ref 26.0–34.0)
MCHC: 34 g/dL (ref 30.0–36.0)
MCV: 100.3 fL — ABNORMAL HIGH (ref 80.0–100.0)
Monocytes Absolute: 0.7 10*3/uL (ref 0.1–1.0)
Monocytes Relative: 9 %
Neutro Abs: 4.4 10*3/uL (ref 1.7–7.7)
Neutrophils Relative %: 51 %
Platelets: 214 10*3/uL (ref 150–400)
RBC: 3.81 MIL/uL — ABNORMAL LOW (ref 3.87–5.11)
RDW: 16.8 % — ABNORMAL HIGH (ref 11.5–15.5)
WBC: 8.4 10*3/uL (ref 4.0–10.5)
nRBC: 0 % (ref 0.0–0.2)

## 2021-12-30 LAB — COMPREHENSIVE METABOLIC PANEL
ALT: 69 U/L — ABNORMAL HIGH (ref 0–44)
AST: 75 U/L — ABNORMAL HIGH (ref 15–41)
Albumin: 3.3 g/dL — ABNORMAL LOW (ref 3.5–5.0)
Alkaline Phosphatase: 73 U/L (ref 38–126)
Anion gap: 7 (ref 5–15)
BUN: 6 mg/dL (ref 6–20)
CO2: 27 mmol/L (ref 22–32)
Calcium: 9.2 mg/dL (ref 8.9–10.3)
Chloride: 106 mmol/L (ref 98–111)
Creatinine, Ser: 0.66 mg/dL (ref 0.44–1.00)
GFR, Estimated: >60 mL/min (ref >60)
Glucose, Bld: 93 mg/dL (ref 70–99)
Potassium: 3.7 mmol/L (ref 3.5–5.1)
Sodium: 140 mmol/L (ref 135–145)
Total Bilirubin: 1 mg/dL (ref 0.3–1.2)
Total Protein: 7.1 g/dL (ref 6.5–8.1)

## 2021-12-30 LAB — MAGNESIUM: Magnesium: 2.2 mg/dL (ref 1.7–2.4)

## 2021-12-30 MED ORDER — OXYCODONE HCL 5 MG PO TABS
5.0000 mg | ORAL_TABLET | ORAL | Status: DC | PRN
  Administered 2021-12-30 (×2): 5 mg via ORAL
  Filled 2021-12-30 (×2): qty 1

## 2021-12-30 MED ORDER — ONDANSETRON HCL 4 MG PO TABS: 4.0000 mg | ORAL_TABLET | Freq: Three times a day (TID) | ORAL | 0 refills | Status: DC | PRN

## 2021-12-30 MED ORDER — OXYCODONE HCL 5 MG PO TABS: 5.0000 mg | ORAL_TABLET | Freq: Four times a day (QID) | ORAL | 0 refills | Status: DC | PRN

## 2021-12-30 MED ORDER — PANTOPRAZOLE SODIUM 40 MG PO TBEC: 40.0000 mg | DELAYED_RELEASE_TABLET | Freq: Every day | ORAL | 0 refills | Status: DC

## 2021-12-30 MED ORDER — THIAMINE HCL 100 MG PO TABS: 100.0000 mg | ORAL_TABLET | Freq: Every day | ORAL | 0 refills | Status: DC

## 2021-12-30 MED ORDER — FOLIC ACID 1 MG PO TABS: 1.0000 mg | ORAL_TABLET | Freq: Every day | ORAL | 0 refills | Status: DC

## 2021-12-30 MED ORDER — ADULT MULTIVITAMIN W/MINERALS CH: 1.0000 | ORAL_TABLET | Freq: Every day | ORAL | Status: DC

## 2021-12-30 NOTE — TOC Transition Note (Signed)
Transition of Care [facility]) - CM/SW Discharge Note   Patient Details  Name: Veronica Calhoun MRN: [id number] Date of Birth: 1985-09-05  Transition of Care [facility]) CM/SW Contact:  Golda Acre, RN Phone Number: 12/30/2021, 1:55 PM   Clinical Narrative:    Dcd to home with self care.  Substance abuse resources given to patient.   Final next level of care: Home/Self Care Barriers to Discharge: Barriers Resolved   Patient Goals and CMS Choice Patient states their goals for this hospitalization and ongoing recovery are:: i want to get some help CMS Medicare.gov Compare Post Acute Care list provided to:: Patient Choice offered to / list presented to : Patient  Discharge Placement                       Discharge Plan and Services   Discharge Planning Services: CM Consult                                 Social Determinants of Health (SDOH) Interventions     Readmission Risk Interventions   No data to display

## 2021-12-30 NOTE — TOC Initial Note (Signed)
Transition of Care [facility]) - Initial/Assessment Note    Patient Details  Name: Veronica Calhoun MRN: [id number] Date of Birth: 13-Dec-1985  Transition of Care [facility]) CM/SW Contact:    Golda Acre, RN Phone Number: 12/30/2021, 7:55 AM  Clinical Narrative:                 [phone] chart reviewed.  Following for toc needs.  Plan is to return home with self-care currently. Resources for inpatient and outpatient alcohol treatment added to the dc instructions Expected Discharge Plan: Home/Self Care Barriers to Discharge: Continued Medical Work up   Patient Goals and CMS Choice Patient states their goals for this hospitalization and ongoing recovery are:: i want to get some help CMS Medicare.gov Compare Post Acute Care list provided to:: Patient Choice offered to / list presented to : Patient  Expected Discharge Plan and Services Expected Discharge Plan: Home/Self Care   Discharge Planning Services: CM Consult   Living arrangements for the past 2 months: Single Family Home                                      Prior Living Arrangements/Services Living arrangements for the past 2 months: Single Family Home Lives with:: Self Patient language and need for interpreter reviewed:: Yes Do you feel safe going back to the place where you live?: Yes            Criminal Activity/Legal Involvement Pertinent to Current Situation/Hospitalization: No - Comment as needed  Activities of Daily Living Home Assistive Devices/Equipment: Blood pressure cuff ADL Screening (condition at time of admission) Patient's cognitive ability adequate to safely complete daily activities?: Yes Is the patient deaf or have difficulty hearing?: No Does the patient have difficulty seeing, even when wearing glasses/contacts?: No Does the patient have difficulty concentrating, remembering, or making decisions?: No Patient able to express need for assistance with ADLs?: Yes Does the patient have  difficulty dressing or bathing?: No Independently performs ADLs?: Yes (appropriate for developmental age) Does the patient have difficulty walking or climbing stairs?: No Weakness of Legs: None Weakness of Arms/Hands: None  Permission Sought/Granted                  Emotional Assessment Appearance:: Appears stated age Attitude/Demeanor/Rapport: Engaged Affect (typically observed): Calm Orientation: : Oriented to Self, Oriented to Place, Oriented to  Time, Oriented to Situation Alcohol / Substance Use: Alcohol Use Psych Involvement: No (comment)  Admission diagnosis:  Pancreatitis [K85.90] Right upper quadrant abdominal pain [R10.11] Hypertension, unspecified type [I10] Nausea and vomiting, unspecified vomiting type [R11.2] Acute pancreatitis [K85.90] Patient Active Problem List   Diagnosis Date Noted   Obesity, Class III, BMI 40-49.9 (morbid obesity) (HCC) 12/29/2021   Acute pancreatitis 12/29/2021   Pancreatitis 12/28/2021   Leukocytosis 12/28/2021   ASCUS with positive high risk HPV cervical 07/21/2019   Warts, 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 [place] [address] Phone: [phone] Fax: [phone]     Social Determinants of Health (SDOH) Interventions    Readmission Risk Interventions   No data to display

## 2021-12-30 NOTE — Plan of Care (Signed)
  Problem: Education: Goal: Knowledge of General Education information will improve Description: Including pain rating scale, medication(s)/side effects and non-pharmacologic comfort measures Outcome: Completed/Met   Problem: Health Behavior/Discharge Planning: Goal: Ability to manage health-related needs will improve Outcome: Completed/Met   Problem: Clinical Measurements: Goal: Ability to maintain clinical measurements within normal limits will improve Outcome: Completed/Met Goal: Will remain free from infection Outcome: Completed/Met Goal: Diagnostic test results will improve Outcome: Completed/Met Goal: Respiratory complications will improve Outcome: Completed/Met Goal: Cardiovascular complication will be avoided Outcome: Completed/Met   Problem: Activity: Goal: Risk for activity intolerance will decrease Outcome: Completed/Met   Problem: Nutrition: Goal: Adequate nutrition will be maintained Outcome: Completed/Met   Problem: Coping: Goal: Level of anxiety will decrease Outcome: Completed/Met   Problem: Elimination: Goal: Will not experience complications related to bowel motility Outcome: Completed/Met Goal: Will not experience complications related to urinary retention Outcome: Completed/Met   Problem: Pain Managment: Goal: General experience of comfort will improve Outcome: Completed/Met   Problem: Safety: Goal: Ability to remain free from injury will improve Outcome: Completed/Met   Problem: Skin Integrity: Goal: Risk for impaired skin integrity will decrease Outcome: Completed/Met   Problem: Education: Goal: Knowledge of Pancreatitis treatment and prevention will improve Outcome: Completed/Met   Problem: Health Behavior/Discharge Planning: Goal: Ability to formulate a plan to maintain an alcohol-free life will improve Outcome: Completed/Met   Problem: Nutritional: Goal: Ability to achieve adequate nutritional intake will improve Outcome:  Completed/Met   Problem: Clinical Measurements: Goal: Complications related to the disease process, condition or treatment will be avoided or minimized Outcome: Completed/Met

## 2021-12-30 NOTE — Discharge Summary (Signed)
Physician Discharge Summary  Veronica Calhoun Stammer OHF:[id number] DOB: 1985-06-02 DOA: 12/28/2021  PCP: Pcp, No  Admit date: 12/28/2021 Discharge date: 12/30/2021  Admitted From: Home Disposition: Home  Recommendations for Outpatient Follow-up:  Follow up with PCP in 1 week with repeat CBC/CMP Abstain from alcohol Follow up in ED if symptoms worsen or new appear   Home Health: No Equipment/Devices: None  Discharge Condition: Stable CODE STATUS: Full Diet recommendation: Heart healthy  Brief/Interim Summary: 36 year old female with history of alcohol abuse, hypertension, anxiety, depression presented with abdominal pain, nausea and vomiting.  On presentation, LFTs were mildly elevated, lipase was in the 80s.  UDS was positive for opiates only.  Right upper quadrant ultrasound showed pericholecystic fluid but otherwise normal, hepatic steatosis.  CT of the abdomen and pelvis showed possible acute pancreatitis or duodenitis.  She was started on IV fluids and analgesics.  During the hospitalization, her condition is improved.  Diet has been gradually started and advanced; she is tolerating diet.  She wants to go home today.  She will be discharged home today with close outpatient follow-up with PCP.  She needs to abstain from alcohol.  Discharge Diagnoses:   Possible acute pancreatitis, most likely alcohol induced Possible duodenitis -As per imaging.  Treated with IV fluids, IV analgesics and antiemetics as needed.   -During the hospitalization, her condition is improved.  Diet has been gradually started and advanced; she is tolerating diet.  She wants to go home today.  She will be discharged home today with close outpatient follow-up with PCP.  She needs to abstain from alcohol. -Currently on IV Protonix.  Will switch to oral Protonix 40 mg daily on discharge.   Alcohol abuse -Counseled regarding abstinence.  TOC consulted.  Treated with CIWA protocol.  Continue multivitamin, thiamine and  folate upon discharge.   Hypertensive urgency -Has not taken her home blood pressure medications for 2 weeks now and does not give any reason.  Blood pressure improved.  Resume home regimen on discharge.  Outpatient follow-up with PCP.  Recommend to be compliant with medications.    Obesity -Outpatient follow-up    leukocytosis -Possibly reactive.  Resolved   Hypokalemia -Resolved   Hyponatremia -Improved   Elevated LFTs -Possibly from alcohol abuse.  Mild.  Outpatient follow-up.  Discharge Instructions   Allergies as of 12/30/2021       Reactions   Lisinopril Swelling   Reports Lip swelling   Amlodipine Other (See Comments)   "like I was going to pass out"        Medication List     STOP taking these medications    sodium phosphate 7-19 GM/118ML Enem   Vivitrol 380 MG Susr Generic drug: Naltrexone       TAKE these medications    BC HEADACHE POWDER PO Take 1 packet by mouth every 6 (six) hours as needed (pain/headache).   bismuth subsalicylate 262 MG chewable tablet Commonly known as: PEPTO BISMOL Chew 524 mg by mouth 2 (two) times daily as needed for indigestion (stomach pain).   cetirizine 10 MG tablet Commonly known as: ZYRTEC Take 10 mg by mouth daily.   cloNIDine 0.2 MG tablet Commonly known as: CATAPRES Take 0.2 mg by mouth 2 (two) times daily.   folic acid 1 MG tablet Commonly known as: FOLVITE Take 1 tablet (1 mg total) by mouth daily. Start taking on: December 31, 2021   hydrochlorothiazide 25 MG tablet Commonly known as: HYDRODIURIL Take 1 tablet (25 mg total) by mouth  daily.   metoprolol tartrate 25 MG tablet Commonly known as: LOPRESSOR Take 25 mg by mouth 2 (two) times daily.   multivitamin with minerals Tabs tablet Take 1 tablet by mouth daily. Start taking on: December 31, 2021   ondansetron 4 MG tablet Commonly known as: Zofran Take 1 tablet (4 mg total) by mouth every 8 (eight) hours as needed for nausea or vomiting.    oxyCODONE 5 MG immediate release tablet Commonly known as: Oxy IR/ROXICODONE Take 1 tablet (5 mg total) by mouth every 6 (six) hours as needed for severe pain.   pantoprazole 40 MG tablet Commonly known as: Protonix Take 1 tablet (40 mg total) by mouth daily.   thiamine 100 MG tablet Commonly known as: VITAMIN B1 Take 1 tablet (100 mg total) by mouth daily. Start taking on: December 31, 2021   triamcinolone cream 0.1 % Commonly known as: KENALOG Apply 1 application  topically daily as needed (rash/itching).         Follow-up Information     PCP. Schedule an appointment as soon as possible for a visit in 1 week(s).                 Allergies  Allergen Reactions   Lisinopril Swelling    Reports Lip swelling   Amlodipine Other (See Comments)    "like I was going to pass out"    Consultations: None   Procedures/Studies: CT ABDOMEN PELVIS W CONTRAST  Result Date: 12/28/2021 CLINICAL DATA:  Right lower quadrant abdominal pain EXAM: CT ABDOMEN AND PELVIS WITH CONTRAST TECHNIQUE: Multidetector CT imaging of the abdomen and pelvis was performed using the standard protocol following bolus administration of intravenous contrast. RADIATION DOSE REDUCTION: This exam was performed according to the departmental dose-optimization program which includes automated exposure control, adjustment of the mA and/or kV according to patient size and/or use of iterative reconstruction technique. CONTRAST:  OMNIPAQUE IOHEXOL 300 MG/ML  SOLN COMPARISON:  None Available. FINDINGS: Lower chest: No acute abnormality. Hepatobiliary: Mild hepatic steatosis. No focal liver abnormality is seen. No gallstones, gallbladder wall thickening, or biliary dilatation. Pancreas: Mild fat stranding centered about the duodenum and head of the pancreas. No pancreatic ductal dilatation or surrounding inflammatory changes. Spleen: Normal in size without focal abnormality. Adrenals/Urinary Tract: Adrenal glands  are unremarkable. Kidneys are normal, without renal calculi, focal lesion, or hydronephrosis. Bladder is unremarkable. Stomach/Bowel: Stomach is within normal limits. Mild periduodenal fat stranding as above. Appendix appears normal. No evidence of bowel wall thickening or obstruction. Vascular/Lymphatic: No significant vascular findings are present. No enlarged abdominal or pelvic lymph nodes. Reproductive: Uterus and bilateral adnexa are unremarkable. Other: No abdominal wall hernia or abnormality. No abdominopelvic ascites. Musculoskeletal: No acute or significant osseous findings. IMPRESSION: Mild fat stranding centered about the duodenum and head of the pancreas, differential considerations include duodenitis or groove pancreatitis. Electronically Signed   By: Allegra Lai M.D.   On: 12/28/2021 16:24   US Abdomen Limited RUQ (LIVER/GB)  Result Date: 12/28/2021 CLINICAL DATA:  Right upper quadrant pain EXAM: ULTRASOUND ABDOMEN LIMITED RIGHT UPPER QUADRANT COMPARISON:  None Available. FINDINGS: Gallbladder: No gallstones or wall thickening visualized. Possible pericholecystic fluid. No sonographic Murphy sign noted by sonographer. Common bile duct: Diameter: 3 mm Liver: No focal lesion identified. Increased parenchymal echogenicity. Portal vein is patent on color Doppler imaging with normal direction of blood flow towards the liver. Other: Limited exam due to patient motion, overlying bowel gas and patient body habitus. IMPRESSION: 1. Possible pericholecystic  fluid, a nonspecific finding. Otherwise, normal sonographic appearance of the gallbladder. 2. Hepatic steatosis. Electronically Signed   By: Allegra Lai M.D.   On: 12/28/2021 14:03      Subjective: Patient seen and examined at bedside.  Feels better and thinks that he is ready to go home today.  No fever, vomiting, chest pain reported.  Denies worsening abdominal pain.  Tolerated current diet.  Discharge Exam: Vitals:   12/30/21 0808  12/30/21 0917  BP:  137/89  Pulse:  75  Resp: 15 18  Temp:    SpO2:      General: Pt is alert, awake, not in acute distress Cardiovascular: rate controlled, S1/S2 + Respiratory: bilateral decreased breath sounds at bases Abdominal: Soft, mildly tender in the upper quadrant, slightly distended, obese, bowel sounds + Extremities: no edema, no cyanosis    The results of significant diagnostics from this hospitalization (including imaging, microbiology, ancillary and laboratory) are listed below for reference.     Microbiology: No results found for this or any previous visit (from the past 240 hour(s)).   Labs: BNP (last 3 results) No results for input(s): "BNP" in the last 8760 hours. Basic Metabolic Panel: Recent Labs  Lab 12/28/21 1250 12/28/21 1256 12/29/21 0346 12/30/21 0332  NA 134* 136 135 140  K 3.7 3.7 3.3* 3.7  CL 101 99 102 106  CO2 24  --  25 27  GLUCOSE 123* 125* 115* 93  BUN 7 4* 5* 6  CREATININE 0.54 0.50 0.54 0.66  CALCIUM 9.6  --  9.1 9.2  MG  --   --   --  2.2   Liver Function Tests: Recent Labs  Lab 12/28/21 1250 12/29/21 0346 12/30/21 0332  AST 70* 52* 75*  ALT 82* 66* 69*  ALKPHOS 108 90 73  BILITOT 1.4* 1.3* 1.0  PROT 8.6* 7.8 7.1  ALBUMIN 4.3 3.6 3.3*   Recent Labs  Lab 12/28/21 1250  LIPASE 86*   No results for input(s): "AMMONIA" in the last 168 hours. CBC: Recent Labs  Lab 12/28/21 1250 12/28/21 1256 12/29/21 0346 12/30/21 0332  WBC 12.2*  --  14.0* 8.4  NEUTROABS 9.6*  --   --  4.4  HGB 15.4* 15.3* 14.8 13.0  HCT 43.4 45.0 42.8 38.2  MCV 95.8  --  98.2 100.3*  PLT 239  --  230 214   Cardiac Enzymes: No results for input(s): "CKTOTAL", "CKMB", "CKMBINDEX", "TROPONINI" in the last 168 hours. BNP: Invalid input(s): "POCBNP" CBG: No results for input(s): "GLUCAP" in the last 168 hours. D-Dimer No results for input(s): "DDIMER" in the last 72 hours. Hgb A1c No results for input(s): "HGBA1C" in the last 72  hours. Lipid Profile No results for input(s): "CHOL", "HDL", "LDLCALC", "TRIG", "CHOLHDL", "LDLDIRECT" in the last 72 hours. Thyroid function studies No results for input(s): "TSH", "T4TOTAL", "T3FREE", "THYROIDAB" in the last 72 hours.  Invalid input(s): "FREET3" Anemia work up No results for input(s): "VITAMINB12", "FOLATE", "FERRITIN", "TIBC", "IRON", "RETICCTPCT" in the last 72 hours. Urinalysis    Component Value Date/Time   COLORURINE YELLOW 12/28/2021 1240   APPEARANCEUR CLEAR 12/28/2021 1240   LABSPEC 1.005 12/28/2021 1240   PHURINE 6.0 12/28/2021 1240   GLUCOSEU NEGATIVE 12/28/2021 1240   HGBUR NEGATIVE 12/28/2021 1240   BILIRUBINUR NEGATIVE 12/28/2021 1240   BILIRUBINUR neg 09/02/2017 1457   KETONESUR 5 (A) 12/28/2021 1240   PROTEINUR 100 (A) 12/28/2021 1240   UROBILINOGEN 0.2 10/16/2019 1500   NITRITE NEGATIVE 12/28/2021 1240  LEUKOCYTESUR NEGATIVE 12/28/2021 1240   Sepsis Labs Recent Labs  Lab 12/28/21 1250 12/29/21 0346 12/30/21 0332  WBC 12.2* 14.0* 8.4   Microbiology No results found for this or any previous visit (from the past 240 hour(s)).   Time coordinating discharge: 35 minutes  SIGNED:   Glade Lloyd, MD  [facility] 12/30/2021, 11:51 AM

## 2022-06-10 ENCOUNTER — Emergency Department (HOSPITAL_COMMUNITY): Payer: Medicaid Other

## 2022-06-10 ENCOUNTER — Other Ambulatory Visit: Payer: Self-pay

## 2022-06-10 ENCOUNTER — Inpatient Hospital Stay (HOSPITAL_COMMUNITY)
Admission: EM | Admit: 2022-06-10 | Discharge: 2022-06-23 | DRG: 438 | Disposition: A | Discharge status: Home / Self Care | Payer: Medicaid Other | Attending: Internal Medicine | Admitting: Internal Medicine

## 2022-06-10 ENCOUNTER — Encounter (HOSPITAL_COMMUNITY): Payer: Self-pay

## 2022-06-10 DIAGNOSIS — Z888 Allergy status to other drugs, medicaments and biological substances status: Secondary | ICD-10-CM

## 2022-06-10 DIAGNOSIS — J189 Pneumonia, unspecified organism: Secondary | ICD-10-CM | POA: Diagnosis not present

## 2022-06-10 DIAGNOSIS — E869 Volume depletion, unspecified: Secondary | ICD-10-CM | POA: Diagnosis not present

## 2022-06-10 DIAGNOSIS — Z1629 Resistance to other single specified antibiotic: Secondary | ICD-10-CM | POA: Diagnosis present

## 2022-06-10 DIAGNOSIS — K219 Gastro-esophageal reflux disease without esophagitis: Secondary | ICD-10-CM | POA: Diagnosis present

## 2022-06-10 DIAGNOSIS — F1020 Alcohol dependence, uncomplicated: Secondary | ICD-10-CM | POA: Diagnosis present

## 2022-06-10 DIAGNOSIS — B9620 Unspecified Escherichia coli [E. coli] as the cause of diseases classified elsewhere: Secondary | ICD-10-CM | POA: Diagnosis present

## 2022-06-10 DIAGNOSIS — E876 Hypokalemia: Secondary | ICD-10-CM | POA: Diagnosis present

## 2022-06-10 DIAGNOSIS — E871 Hypo-osmolality and hyponatremia: Secondary | ICD-10-CM | POA: Diagnosis present

## 2022-06-10 DIAGNOSIS — Z28310 Unvaccinated for covid-19: Secondary | ICD-10-CM

## 2022-06-10 DIAGNOSIS — Z6841 Body Mass Index (BMI) 40.0 and over, adult: Secondary | ICD-10-CM

## 2022-06-10 DIAGNOSIS — F17200 Nicotine dependence, unspecified, uncomplicated: Secondary | ICD-10-CM | POA: Insufficient documentation

## 2022-06-10 DIAGNOSIS — A419 Sepsis, unspecified organism: Secondary | ICD-10-CM | POA: Diagnosis not present

## 2022-06-10 DIAGNOSIS — F10239 Alcohol dependence with withdrawal, unspecified: Secondary | ICD-10-CM | POA: Diagnosis present

## 2022-06-10 DIAGNOSIS — Y95 Nosocomial condition: Secondary | ICD-10-CM | POA: Diagnosis not present

## 2022-06-10 DIAGNOSIS — R1013 Epigastric pain: Secondary | ICD-10-CM

## 2022-06-10 DIAGNOSIS — Z8249 Family history of ischemic heart disease and other diseases of the circulatory system: Secondary | ICD-10-CM

## 2022-06-10 DIAGNOSIS — I160 Hypertensive urgency: Secondary | ICD-10-CM | POA: Diagnosis present

## 2022-06-10 DIAGNOSIS — N179 Acute kidney failure, unspecified: Secondary | ICD-10-CM | POA: Diagnosis present

## 2022-06-10 DIAGNOSIS — N390 Urinary tract infection, site not specified: Secondary | ICD-10-CM | POA: Diagnosis present

## 2022-06-10 DIAGNOSIS — K760 Fatty (change of) liver, not elsewhere classified: Secondary | ICD-10-CM | POA: Diagnosis present

## 2022-06-10 DIAGNOSIS — Z79899 Other long term (current) drug therapy: Secondary | ICD-10-CM

## 2022-06-10 DIAGNOSIS — K567 Ileus, unspecified: Secondary | ICD-10-CM

## 2022-06-10 DIAGNOSIS — Z91199 Patient's noncompliance with other medical treatment and regimen due to unspecified reason: Secondary | ICD-10-CM

## 2022-06-10 DIAGNOSIS — I10 Essential (primary) hypertension: Secondary | ICD-10-CM | POA: Diagnosis present

## 2022-06-10 DIAGNOSIS — F17210 Nicotine dependence, cigarettes, uncomplicated: Secondary | ICD-10-CM | POA: Diagnosis present

## 2022-06-10 DIAGNOSIS — F419 Anxiety disorder, unspecified: Secondary | ICD-10-CM | POA: Diagnosis present

## 2022-06-10 DIAGNOSIS — Z1152 Encounter for screening for COVID-19: Secondary | ICD-10-CM

## 2022-06-10 DIAGNOSIS — E8809 Other disorders of plasma-protein metabolism, not elsewhere classified: Secondary | ICD-10-CM | POA: Diagnosis not present

## 2022-06-10 DIAGNOSIS — Z1611 Resistance to penicillins: Secondary | ICD-10-CM | POA: Diagnosis present

## 2022-06-10 DIAGNOSIS — R601 Generalized edema: Secondary | ICD-10-CM

## 2022-06-10 DIAGNOSIS — K852 Alcohol induced acute pancreatitis without necrosis or infection: Secondary | ICD-10-CM

## 2022-06-10 DIAGNOSIS — K7010 Alcoholic hepatitis without ascites: Secondary | ICD-10-CM

## 2022-06-10 DIAGNOSIS — R000 Tachycardia, unspecified: Secondary | ICD-10-CM | POA: Insufficient documentation

## 2022-06-10 DIAGNOSIS — E8770 Fluid overload, unspecified: Secondary | ICD-10-CM | POA: Diagnosis not present

## 2022-06-10 DIAGNOSIS — K7011 Alcoholic hepatitis with ascites: Secondary | ICD-10-CM | POA: Diagnosis present

## 2022-06-10 DIAGNOSIS — R7401 Elevation of levels of liver transaminase levels: Secondary | ICD-10-CM | POA: Insufficient documentation

## 2022-06-10 DIAGNOSIS — K859 Acute pancreatitis without necrosis or infection, unspecified: Secondary | ICD-10-CM | POA: Diagnosis present

## 2022-06-10 DIAGNOSIS — I169 Hypertensive crisis, unspecified: Secondary | ICD-10-CM | POA: Insufficient documentation

## 2022-06-10 HISTORY — DX: Alcohol induced acute pancreatitis without necrosis or infection: K85.20

## 2022-06-10 LAB — COMPREHENSIVE METABOLIC PANEL
ALT: 115 U/L — ABNORMAL HIGH (ref 0–44)
AST: 308 U/L — ABNORMAL HIGH (ref 15–41)
Albumin: 3.5 g/dL (ref 3.5–5.0)
Alkaline Phosphatase: 169 U/L — ABNORMAL HIGH (ref 38–126)
Anion gap: 14 (ref 5–15)
BUN: <5 mg/dL — ABNORMAL LOW (ref 6–20)
CO2: 19 mmol/L — ABNORMAL LOW (ref 22–32)
Calcium: 8.4 mg/dL — ABNORMAL LOW (ref 8.9–10.3)
Chloride: 100 mmol/L (ref 98–111)
Creatinine, Ser: 0.54 mg/dL (ref 0.44–1.00)
GFR, Estimated: >60 mL/min (ref >60)
Glucose, Bld: 209 mg/dL — ABNORMAL HIGH (ref 70–99)
Potassium: 3.3 mmol/L — ABNORMAL LOW (ref 3.5–5.1)
Sodium: 133 mmol/L — ABNORMAL LOW (ref 135–145)
Total Bilirubin: 1.2 mg/dL (ref 0.3–1.2)
Total Protein: 8.2 g/dL — ABNORMAL HIGH (ref 6.5–8.1)

## 2022-06-10 LAB — CBC
HCT: 45.2 % (ref 36.0–46.0)
Hemoglobin: 16.1 g/dL — ABNORMAL HIGH (ref 12.0–15.0)
MCH: 34.7 pg — ABNORMAL HIGH (ref 26.0–34.0)
MCHC: 35.6 g/dL (ref 30.0–36.0)
MCV: 97.4 fL (ref 80.0–100.0)
Platelets: 263 10*3/uL (ref 150–400)
RBC: 4.64 MIL/uL (ref 3.87–5.11)
RDW: 14.4 % (ref 11.5–15.5)
WBC: 8.9 10*3/uL (ref 4.0–10.5)
nRBC: 0 % (ref 0.0–0.2)

## 2022-06-10 LAB — LIPASE, BLOOD
Lipase: 965 U/L — ABNORMAL HIGH (ref 11–51)
Lipase: 637 U/L — ABNORMAL HIGH (ref 11–51)

## 2022-06-10 LAB — HCG, QUANTITATIVE, PREGNANCY: hCG, Beta Chain, Quant, S: <1 m[IU]/mL (ref <5)

## 2022-06-10 MED ORDER — LORAZEPAM 1 MG PO TABS
1.0000 mg | ORAL_TABLET | ORAL | Status: AC | PRN
  Administered 2022-06-12: 2 mg via ORAL
  Filled 2022-06-10: qty 1

## 2022-06-10 MED ORDER — OXYCODONE HCL 5 MG PO TABS
5.0000 mg | ORAL_TABLET | ORAL | Status: DC | PRN
  Administered 2022-06-11 – 2022-06-14 (×4): 5 mg via ORAL
  Filled 2022-06-10 (×4): qty 1

## 2022-06-10 MED ORDER — THIAMINE HCL 100 MG/ML IJ SOLN
100.0000 mg | Freq: Every day | INTRAMUSCULAR | Status: DC
  Administered 2022-06-11: 100 mg via INTRAVENOUS
  Filled 2022-06-10: qty 2

## 2022-06-10 MED ORDER — FOLIC ACID 1 MG PO TABS
1.0000 mg | ORAL_TABLET | Freq: Every day | ORAL | Status: DC
  Administered 2022-06-11 – 2022-06-23 (×13): 1 mg via ORAL
  Filled 2022-06-10 (×13): qty 1

## 2022-06-10 MED ORDER — PANTOPRAZOLE SODIUM 40 MG IV SOLR
40.0000 mg | Freq: Two times a day (BID) | INTRAVENOUS | Status: DC
  Administered 2022-06-10 – 2022-06-23 (×26): 40 mg via INTRAVENOUS
  Filled 2022-06-10 (×26): qty 10

## 2022-06-10 MED ORDER — HEPARIN SODIUM (PORCINE) 5000 UNIT/ML IJ SOLN
5000.0000 [IU] | Freq: Three times a day (TID) | INTRAMUSCULAR | Status: DC
  Administered 2022-06-10 – 2022-06-23 (×38): 5000 [IU] via SUBCUTANEOUS
  Filled 2022-06-10 (×38): qty 1

## 2022-06-10 MED ORDER — ONDANSETRON HCL 4 MG PO TABS: 4.0000 mg | ORAL_TABLET | Freq: Four times a day (QID) | ORAL | Status: DC | PRN

## 2022-06-10 MED ORDER — IBUPROFEN 400 MG PO TABS
400.0000 mg | ORAL_TABLET | Freq: Four times a day (QID) | ORAL | Status: DC | PRN
  Administered 2022-06-10 – 2022-06-21 (×8): 400 mg via ORAL
  Filled 2022-06-10 (×8): qty 1

## 2022-06-10 MED ORDER — FENTANYL CITRATE PF 50 MCG/ML IJ SOSY
50.0000 ug | PREFILLED_SYRINGE | Freq: Once | INTRAMUSCULAR | Status: AC
  Administered 2022-06-10: 50 ug via INTRAVENOUS
  Filled 2022-06-10: qty 1

## 2022-06-10 MED ORDER — MORPHINE SULFATE (PF) 4 MG/ML IV SOLN
8.0000 mg | Freq: Once | INTRAVENOUS | Status: AC
  Administered 2022-06-10: 8 mg via INTRAVENOUS
  Filled 2022-06-10: qty 2

## 2022-06-10 MED ORDER — ADULT MULTIVITAMIN W/MINERALS CH
1.0000 | ORAL_TABLET | Freq: Every day | ORAL | Status: DC
  Administered 2022-06-11 – 2022-06-23 (×13): 1 via ORAL
  Filled 2022-06-10 (×13): qty 1

## 2022-06-10 MED ORDER — MORPHINE SULFATE (PF) 4 MG/ML IV SOLN
4.0000 mg | Freq: Once | INTRAVENOUS | Status: AC
  Administered 2022-06-10: 4 mg via INTRAVENOUS
  Filled 2022-06-10: qty 1

## 2022-06-10 MED ORDER — LORAZEPAM 2 MG/ML IJ SOLN
0.0000 mg | Freq: Two times a day (BID) | INTRAMUSCULAR | Status: AC
  Administered 2022-06-13: 2 mg via INTRAVENOUS
  Administered 2022-06-14: 1 mg via INTRAVENOUS
  Administered 2022-06-14: 2 mg via INTRAVENOUS
  Filled 2022-06-10 (×3): qty 1

## 2022-06-10 MED ORDER — LABETALOL HCL 5 MG/ML IV SOLN
10.0000 mg | Freq: Once | INTRAVENOUS | Status: AC
  Administered 2022-06-10: 10 mg via INTRAVENOUS
  Filled 2022-06-10: qty 4

## 2022-06-10 MED ORDER — NICOTINE 21 MG/24HR TD PT24
21.0000 mg | MEDICATED_PATCH | Freq: Every day | TRANSDERMAL | Status: DC
  Administered 2022-06-10 – 2022-06-22 (×13): 21 mg via TRANSDERMAL
  Filled 2022-06-10 (×14): qty 1

## 2022-06-10 MED ORDER — ONDANSETRON HCL 4 MG/2ML IJ SOLN
4.0000 mg | Freq: Four times a day (QID) | INTRAMUSCULAR | Status: DC | PRN
  Administered 2022-06-11: 4 mg via INTRAVENOUS
  Filled 2022-06-10: qty 2

## 2022-06-10 MED ORDER — LORAZEPAM 1 MG PO TABS
1.0000 mg | ORAL_TABLET | ORAL | Status: AC | PRN
  Administered 2022-06-10 – 2022-06-12 (×2): 2 mg via ORAL
  Administered 2022-06-13: 1 mg via ORAL
  Filled 2022-06-10 (×3): qty 2

## 2022-06-10 MED ORDER — THIAMINE MONONITRATE 100 MG PO TABS
100.0000 mg | ORAL_TABLET | Freq: Every day | ORAL | Status: DC
  Administered 2022-06-12 – 2022-06-23 (×12): 100 mg via ORAL
  Filled 2022-06-10 (×13): qty 1

## 2022-06-10 MED ORDER — CHLORHEXIDINE GLUCONATE CLOTH 2 % EX PADS
6.0000 | MEDICATED_PAD | Freq: Every day | CUTANEOUS | Status: DC
  Administered 2022-06-10 – 2022-06-16 (×5): 6 via TOPICAL

## 2022-06-10 MED ORDER — MORPHINE SULFATE (PF) 2 MG/ML IV SOLN
2.0000 mg | INTRAVENOUS | Status: DC | PRN
  Administered 2022-06-10 – 2022-06-11 (×2): 2 mg via INTRAVENOUS
  Filled 2022-06-10 (×2): qty 1

## 2022-06-10 MED ORDER — LORAZEPAM 2 MG/ML IJ SOLN
1.0000 mg | Freq: Once | INTRAMUSCULAR | Status: AC
  Administered 2022-06-10: 1 mg via INTRAVENOUS
  Filled 2022-06-10: qty 1

## 2022-06-10 MED ORDER — LORAZEPAM 2 MG/ML IJ SOLN
1.0000 mg | INTRAMUSCULAR | Status: AC | PRN
  Administered 2022-06-11 (×3): 2 mg via INTRAVENOUS
  Filled 2022-06-10 (×2): qty 1

## 2022-06-10 MED ORDER — SODIUM CHLORIDE 0.9 % IV BOLUS
1000.0000 mL | Freq: Once | INTRAVENOUS | Status: AC
  Administered 2022-06-10: 1000 mL via INTRAVENOUS

## 2022-06-10 MED ORDER — HYDROMORPHONE HCL 1 MG/ML IJ SOLN
1.0000 mg | Freq: Once | INTRAMUSCULAR | Status: AC
  Administered 2022-06-10: 1 mg via INTRAVENOUS
  Filled 2022-06-10: qty 1

## 2022-06-10 MED ORDER — HYDRALAZINE HCL 20 MG/ML IJ SOLN
10.0000 mg | Freq: Once | INTRAMUSCULAR | Status: AC
  Administered 2022-06-10: 10 mg via INTRAVENOUS
  Filled 2022-06-10: qty 1

## 2022-06-10 MED ORDER — SODIUM CHLORIDE 0.9 % IV SOLN: INTRAVENOUS | Status: DC

## 2022-06-10 MED ORDER — METOPROLOL TARTRATE 5 MG/5ML IV SOLN
5.0000 mg | Freq: Once | INTRAVENOUS | Status: AC
  Administered 2022-06-10: 5 mg via INTRAVENOUS
  Filled 2022-06-10: qty 5

## 2022-06-10 MED ORDER — DILTIAZEM HCL 25 MG/5ML IV SOLN
15.0000 mg | Freq: Once | INTRAVENOUS | Status: AC
  Administered 2022-06-10: 15 mg via INTRAVENOUS
  Filled 2022-06-10: qty 5

## 2022-06-10 MED ORDER — LORAZEPAM 2 MG/ML IJ SOLN
0.0000 mg | Freq: Four times a day (QID) | INTRAMUSCULAR | Status: AC
  Administered 2022-06-10 – 2022-06-11 (×4): 2 mg via INTRAVENOUS
  Filled 2022-06-10 (×4): qty 1

## 2022-06-10 MED ORDER — ONDANSETRON HCL 4 MG/2ML IJ SOLN
4.0000 mg | Freq: Once | INTRAMUSCULAR | Status: AC
  Administered 2022-06-10: 4 mg via INTRAVENOUS
  Filled 2022-06-10: qty 2

## 2022-06-10 MED ORDER — DICYCLOMINE HCL 10 MG/ML IM SOLN
20.0000 mg | Freq: Once | INTRAMUSCULAR | Status: AC
  Administered 2022-06-10: 20 mg via INTRAMUSCULAR
  Filled 2022-06-10: qty 2

## 2022-06-10 MED ORDER — NICARDIPINE HCL IN NACL 20-0.86 MG/200ML-% IV SOLN
3.0000 mg/h | INTRAVENOUS | Status: DC
  Administered 2022-06-10: 3 mg/h via INTRAVENOUS
  Filled 2022-06-10: qty 200

## 2022-06-10 NOTE — ED Notes (Signed)
PA aware of bp 

## 2022-06-10 NOTE — ED Provider Notes (Signed)
[facility] Provider Note   CSN: [id number] Arrival date & time: 06/10/22  1255     History  Chief Complaint  Patient presents with   Abdominal Pain    Veronica Calhoun is a 37 y.o. female.  The history is provided by the patient.  Abdominal Pain Pain location:  RUQ and epigastric Pain quality: sharp and stabbing   Pain radiates to:  Does not radiate Pain severity:  Severe Onset quality:  Gradual Duration:  4 hours Timing:  Constant Progression:  Worsening Context: alcohol use and retching   Context comment:  Drank a beer this am,  had 4-5 beers and 2 shots of liquor yesterday Relieved by:  None tried Worsened by:  Nothing Ineffective treatments:  None tried Associated symptoms: nausea and vomiting   Associated symptoms: no chest pain, no chills, no constipation, no diarrhea, no dysuria, no fever, no shortness of breath and no sore throat   Risk factors: alcohol abuse   Risk factors comment:  Reports history of pancreatitis      Home Medications Prior to Admission medications   Medication Sig Start Date End Date Taking? Authorizing Provider  Aspirin-Salicylamide-Caffeine (BC HEADACHE POWDER PO) Take 1 packet by mouth every 6 (six) hours as needed (pain/headache).   Yes [provider]      Allergies    Lisinopril and Amlodipine    Review of Systems   Review of Systems  Constitutional:  Negative for chills and fever.  HENT:  Negative for congestion and sore throat.   Eyes: Negative.   Respiratory:  Negative for chest tightness and shortness of breath.   Cardiovascular:  Negative for chest pain.  Gastrointestinal:  Positive for abdominal pain, nausea and vomiting. Negative for constipation and diarrhea.       No hematemesis  Genitourinary: Negative.  Negative for dysuria.  Musculoskeletal:  Negative for arthralgias, joint swelling and neck pain.  Skin: Negative.  Negative for rash and wound.   Neurological:  Negative for dizziness, weakness, light-headedness, numbness and headaches.  Psychiatric/Behavioral: Negative.      Physical Exam Updated Vital Signs BP (!) 213/116   Pulse (!) 106   Temp 97.6 F (36.4 C) (Oral)   Resp 20   Ht 5\' 6"  (1.676 m)   Wt 117.9 kg   LMP 06/09/2022 (Exact Date)   SpO2 96%   BMI 41.97 kg/m  Physical Exam Vitals and nursing note reviewed.  Constitutional:      Appearance: She is well-developed.  HENT:     Head: Normocephalic and atraumatic.  Eyes:     Conjunctiva/sclera: Conjunctivae normal.  Cardiovascular:     Rate and Rhythm: Normal rate and regular rhythm.     Heart sounds: Normal heart sounds.  Pulmonary:     Effort: Pulmonary effort is normal.     Breath sounds: Normal breath sounds. No wheezing.  Abdominal:     General: Bowel sounds are normal. There is distension.     Palpations: Abdomen is soft.     Tenderness: There is abdominal tenderness in the right upper quadrant and epigastric area.     Hernia: No hernia is present.  Musculoskeletal:        General: Normal range of motion.     Cervical back: Normal range of motion.  Skin:    General: Skin is warm and dry.  Neurological:     Mental Status: She is alert.     ED Results / Procedures /  Treatments   Labs (all labs ordered are listed, but only abnormal results are displayed) Labs Reviewed  LIPASE, BLOOD - Abnormal; Notable for the following components:      Result Value   Lipase 965 (*)    All other components within normal limits  COMPREHENSIVE METABOLIC PANEL - Abnormal; Notable for the following components:   Sodium 133 (*)    Potassium 3.3 (*)    CO2 19 (*)    Glucose, Bld 209 (*)    BUN <5 (*)    Calcium 8.4 (*)    Total Protein 8.2 (*)    AST 308 (*)    ALT 115 (*)    Alkaline Phosphatase 169 (*)    All other components within normal limits  CBC - Abnormal; Notable for the following components:   Hemoglobin 16.1 (*)    MCH 34.7 (*)    All other  components within normal limits  HCG, QUANTITATIVE, PREGNANCY  URINALYSIS, ROUTINE W REFLEX MICROSCOPIC  POC URINE PREG, ED    EKG None  Radiology US Abdomen Complete  Result Date: 06/10/2022 CLINICAL DATA:  Epigastric abdominal pain since this morning. EXAM: ABDOMEN ULTRASOUND COMPLETE COMPARISON:  CT scan 12/28/2021 FINDINGS: Gallbladder: No gallstones or wall thickening visualized. No sonographic Murphy sign noted by sonographer. Common bile duct: Diameter: 5.0 mm Liver: There is diffuse increased echogenicity of the liver and decreased through transmission consistent with fatty infiltration. No focal lesions or biliary dilatation. Portal vein is patent on color Doppler imaging with normal direction of blood flow towards the liver. IVC: Normal caliber Pancreas: Visualized portion unremarkable. Spleen: Not well visualize. Right Kidney: Length: 13.4 cm. Normal renal cortical thickness and echogenicity without focal lesions or hydronephrosis. Left Kidney: Length: 11.4 cm. Normal renal cortical thickness and echogenicity without focal lesions or hydronephrosis. Abdominal aorta: Poorly visualized. Other findings: No ascites IMPRESSION: 1. Diffuse fatty infiltration liver but no hepatic lesions or intrahepatic biliary dilatation. 2. Normal gallbladder. 3. Poorly visualized pancreas, spleen and aorta. 4. Normal kidneys.  No ascites. Electronically Signed   By: Marijo Sanes M.D.   On: 06/10/2022 16:46    Procedures Procedures    Medications Ordered in ED Medications  LORazepam (ATIVAN) tablet 1-4 mg (has no administration in time range)  morphine (PF) 4 MG/ML injection 8 mg (has no administration in time range)  morphine (PF) 4 MG/ML injection 4 mg (4 mg Intravenous Given 06/10/22 1417)  ondansetron (ZOFRAN) injection 4 mg (4 mg Intravenous Given 06/10/22 1423)  fentaNYL (SUBLIMAZE) injection 50 mcg (50 mcg Intravenous Given 06/10/22 1530)  sodium chloride 0.9 % bolus 1,000 mL (0 mLs Intravenous  Stopped 06/10/22 1613)  HYDROmorphone (DILAUDID) injection 1 mg (1 mg Intravenous Given 06/10/22 1619)  sodium chloride 0.9 % bolus 1,000 mL (0 mLs Intravenous Stopped 06/10/22 1844)  hydrALAZINE (APRESOLINE) injection 10 mg (10 mg Intravenous Given 06/10/22 1624)  HYDROmorphone (DILAUDID) injection 1 mg (1 mg Intravenous Given 06/10/22 1719)  LORazepam (ATIVAN) injection 1 mg (1 mg Intravenous Given 06/10/22 1731)  labetalol (NORMODYNE) injection 10 mg (10 mg Intravenous Given 06/10/22 1733)    ED Course/ Medical Decision Making/ A&P                             Medical Decision Making Patient who is a daily drinker, history of pancreatitis presenting with severe pain in her upper abdomen starting today along with nausea and vomiting, consistent with prior episodes of pancreatitis.  No fevers.  Last alcohol intake prior to arrival, patient states she drank a beer.  She also has a history of hypertension but has not been on any antihypertensive medications in months.  She denies headache, chest pain, orthopnea.  She has been given multiple doses of pain medication including morphine, fentanyl, Dilaudid, also given a dose of Ativan, but still has 8 out of 10 pain.  She also has an extremely high blood pressure which has not responded to hydralazine or labetalol.  Amount and/or Complexity of Data Reviewed Labs: ordered.    Details: Pertinent labs including a lipase of 965, glucose of 209, she has an AST of 308, ALT of 115 and alk phos of 169.  She has a normal WBC count at 8.9. Radiology: ordered.    Details: Ultrasound revealing fatty infiltration of the liver, normal gallbladder.  Poorly visualized pancreas. Discussion of management or test interpretation with external provider(s): Patient discussed with Dr. Clearence Ped accepts patient for admission.  Risk Prescription drug management.    CRITICAL CARE Performed by: Evalee Jefferson Total critical care time: 45 minutes Critical care time was exclusive of  separately billable procedures and treating other patients. Critical care was necessary to treat or prevent imminent or life-threatening deterioration. Critical care was time spent personally by me on the following activities: development of treatment plan with patient and/or surrogate as well as nursing, discussions with consultants, evaluation of patient's response to treatment, examination of patient, obtaining history from patient or surrogate, ordering and performing treatments and interventions, ordering and review of laboratory studies, ordering and review of radiographic studies, pulse oximetry and re-evaluation of patient's condition.        Final Clinical Impression(s) / ED Diagnoses Final diagnoses:  Alcohol-induced acute pancreatitis without infection or necrosis  Hypertensive crisis    Rx / DC Orders ED Discharge Orders     None         Landis Martins 06/10/22 1917    Varney Biles, MD 06/11/22 [phone]

## 2022-06-10 NOTE — Progress Notes (Signed)
Transition of Care [facility]) - Emergency Department Mini Assessment   Patient Details  Name: Veronica Calhoun MRN: [id number] Date of Birth: Oct 02, 1985  [facility]) CM/SW Contact:    Joaquin Courts, RN Phone Number: 06/10/2022, 4:54 PM   Clinical Narrative: CM noted TOC consult for SA resources.  Copies of resources added to AVS.   ED Mini Assessment: What brought you to the Emergency Department? : abd pain  Barriers to Discharge: No Barriers Identified     Means of departure: Car  Interventions which prevented an admission or readmission: Other (must enter comment) (SA resources)    Patient Contact and Communications        ,                 Admission diagnosis:  ABD PAIN Patient Active Problem List   Diagnosis Date Noted   Obesity, Class III, BMI 40-49.9 (morbid obesity) (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

## 2022-06-10 NOTE — ED Notes (Signed)
Approx. 1/4 cup of ice given to pt with PA approval

## 2022-06-10 NOTE — ED Notes (Signed)
Pt last drink was this morning around 10 am. Pt 6-7 shots of liquor and 4-5 beers a day.

## 2022-06-10 NOTE — ED Notes (Signed)
Pt verbalized she did not take any bp meds today.

## 2022-06-10 NOTE — Assessment & Plan Note (Signed)
-  Ativan given in the ED -Continue ativan per CIWA protocol -Continue to monitor

## 2022-06-10 NOTE — ED Notes (Signed)
Pt states she is unable to urinate. Pt sitting in bed with bra and pants on. Nurse attempted to put gown on pt and pt refused stating she is hot. Pt requesting more pain meds and water.

## 2022-06-10 NOTE — Assessment & Plan Note (Signed)
-  drinks daily -Last use this AM -Abdominal pain, transaminitis, elevated lipase -Abd US shows normal size for common bile duct and normal gall bladder -Patient is not on any medications that would be likely to cause pancreatitis -Advised on the importance of EtOH cessation -NPO -IV fluids -Pain control -Trend lipase -Continue to monitor

## 2022-06-10 NOTE — Assessment & Plan Note (Signed)
-  CIWA protocol -Advised on importance of cessation

## 2022-06-10 NOTE — Assessment & Plan Note (Signed)
-  BP 189/114 - 226/139 -labetalol, hydralazine and control administered in ED -Related to acute illness vs withdrawal -Non adherence to med regimen also contributing -Diltiazem at admission -Patient admitted to step down as she may end up needing Cardene drip for BP

## 2022-06-10 NOTE — Assessment & Plan Note (Signed)
-  AST 308, ALT 115 -related to EtOH and pancreatits -US shows normal CBD  - not likely to have an obstructing stone -If transaminases continue to rise on tomorrow's BMP consider MRCP -Hold hepatotoxic agents when possible -Continue to monitor

## 2022-06-10 NOTE — ED Triage Notes (Signed)
Pt reports RUQ pain x 3 hours, hx of "problems with pancreas". Denies any N/V/D or constipation.

## 2022-06-11 ENCOUNTER — Inpatient Hospital Stay (HOSPITAL_COMMUNITY): Payer: Medicaid Other

## 2022-06-11 DIAGNOSIS — K7011 Alcoholic hepatitis with ascites: Secondary | ICD-10-CM | POA: Diagnosis not present

## 2022-06-11 DIAGNOSIS — F17210 Nicotine dependence, cigarettes, uncomplicated: Secondary | ICD-10-CM | POA: Diagnosis present

## 2022-06-11 DIAGNOSIS — R1011 Right upper quadrant pain: Secondary | ICD-10-CM | POA: Diagnosis not present

## 2022-06-11 DIAGNOSIS — I10 Essential (primary) hypertension: Secondary | ICD-10-CM | POA: Diagnosis not present

## 2022-06-11 DIAGNOSIS — Y95 Nosocomial condition: Secondary | ICD-10-CM | POA: Diagnosis not present

## 2022-06-11 DIAGNOSIS — Z1629 Resistance to other single specified antibiotic: Secondary | ICD-10-CM | POA: Diagnosis present

## 2022-06-11 DIAGNOSIS — K8500 Idiopathic acute pancreatitis without necrosis or infection: Secondary | ICD-10-CM | POA: Diagnosis not present

## 2022-06-11 DIAGNOSIS — K852 Alcohol induced acute pancreatitis without necrosis or infection: Secondary | ICD-10-CM

## 2022-06-11 DIAGNOSIS — Z1152 Encounter for screening for COVID-19: Secondary | ICD-10-CM | POA: Diagnosis not present

## 2022-06-11 DIAGNOSIS — F10239 Alcohol dependence with withdrawal, unspecified: Secondary | ICD-10-CM | POA: Diagnosis not present

## 2022-06-11 DIAGNOSIS — E871 Hypo-osmolality and hyponatremia: Secondary | ICD-10-CM | POA: Diagnosis present

## 2022-06-11 DIAGNOSIS — K760 Fatty (change of) liver, not elsewhere classified: Secondary | ICD-10-CM | POA: Diagnosis not present

## 2022-06-11 DIAGNOSIS — F419 Anxiety disorder, unspecified: Secondary | ICD-10-CM | POA: Diagnosis present

## 2022-06-11 DIAGNOSIS — N179 Acute kidney failure, unspecified: Secondary | ICD-10-CM | POA: Diagnosis not present

## 2022-06-11 DIAGNOSIS — F1020 Alcohol dependence, uncomplicated: Secondary | ICD-10-CM

## 2022-06-11 DIAGNOSIS — K567 Ileus, unspecified: Secondary | ICD-10-CM | POA: Diagnosis not present

## 2022-06-11 DIAGNOSIS — I169 Hypertensive crisis, unspecified: Secondary | ICD-10-CM | POA: Diagnosis present

## 2022-06-11 DIAGNOSIS — E876 Hypokalemia: Secondary | ICD-10-CM | POA: Insufficient documentation

## 2022-06-11 DIAGNOSIS — K860 Alcohol-induced chronic pancreatitis: Secondary | ICD-10-CM | POA: Diagnosis not present

## 2022-06-11 DIAGNOSIS — F17200 Nicotine dependence, unspecified, uncomplicated: Secondary | ICD-10-CM | POA: Insufficient documentation

## 2022-06-11 DIAGNOSIS — R1013 Epigastric pain: Secondary | ICD-10-CM | POA: Diagnosis not present

## 2022-06-11 DIAGNOSIS — E8809 Other disorders of plasma-protein metabolism, not elsewhere classified: Secondary | ICD-10-CM | POA: Diagnosis not present

## 2022-06-11 DIAGNOSIS — F1010 Alcohol abuse, uncomplicated: Secondary | ICD-10-CM | POA: Diagnosis not present

## 2022-06-11 DIAGNOSIS — Z1611 Resistance to penicillins: Secondary | ICD-10-CM | POA: Diagnosis present

## 2022-06-11 DIAGNOSIS — J189 Pneumonia, unspecified organism: Secondary | ICD-10-CM | POA: Diagnosis not present

## 2022-06-11 DIAGNOSIS — R7401 Elevation of levels of liver transaminase levels: Secondary | ICD-10-CM | POA: Diagnosis not present

## 2022-06-11 DIAGNOSIS — R7989 Other specified abnormal findings of blood chemistry: Secondary | ICD-10-CM | POA: Diagnosis not present

## 2022-06-11 DIAGNOSIS — R601 Generalized edema: Secondary | ICD-10-CM | POA: Diagnosis not present

## 2022-06-11 DIAGNOSIS — A419 Sepsis, unspecified organism: Secondary | ICD-10-CM | POA: Diagnosis not present

## 2022-06-11 DIAGNOSIS — N390 Urinary tract infection, site not specified: Secondary | ICD-10-CM | POA: Diagnosis present

## 2022-06-11 DIAGNOSIS — K8591 Acute pancreatitis with uninfected necrosis, unspecified: Secondary | ICD-10-CM | POA: Diagnosis not present

## 2022-06-11 DIAGNOSIS — Z28310 Unvaccinated for covid-19: Secondary | ICD-10-CM | POA: Diagnosis not present

## 2022-06-11 DIAGNOSIS — K7010 Alcoholic hepatitis without ascites: Secondary | ICD-10-CM | POA: Diagnosis not present

## 2022-06-11 DIAGNOSIS — Z6841 Body Mass Index (BMI) 40.0 and over, adult: Secondary | ICD-10-CM | POA: Diagnosis not present

## 2022-06-11 DIAGNOSIS — E869 Volume depletion, unspecified: Secondary | ICD-10-CM | POA: Diagnosis not present

## 2022-06-11 DIAGNOSIS — R000 Tachycardia, unspecified: Secondary | ICD-10-CM | POA: Insufficient documentation

## 2022-06-11 LAB — COMPREHENSIVE METABOLIC PANEL
ALT: 83 U/L — ABNORMAL HIGH (ref 0–44)
AST: 142 U/L — ABNORMAL HIGH (ref 15–41)
Albumin: 3.1 g/dL — ABNORMAL LOW (ref 3.5–5.0)
Alkaline Phosphatase: 133 U/L — ABNORMAL HIGH (ref 38–126)
Anion gap: 13 (ref 5–15)
BUN: 8 mg/dL (ref 6–20)
CO2: 18 mmol/L — ABNORMAL LOW (ref 22–32)
Calcium: 8.2 mg/dL — ABNORMAL LOW (ref 8.9–10.3)
Chloride: 101 mmol/L (ref 98–111)
Creatinine, Ser: 0.85 mg/dL (ref 0.44–1.00)
GFR, Estimated: >60 mL/min (ref >60)
Glucose, Bld: 221 mg/dL — ABNORMAL HIGH (ref 70–99)
Potassium: 4.5 mmol/L (ref 3.5–5.1)
Sodium: 132 mmol/L — ABNORMAL LOW (ref 135–145)
Total Bilirubin: 1.2 mg/dL (ref 0.3–1.2)
Total Protein: 7.5 g/dL (ref 6.5–8.1)

## 2022-06-11 LAB — URINALYSIS, ROUTINE W REFLEX MICROSCOPIC
Bilirubin Urine: NEGATIVE
Glucose, UA: 50 mg/dL — AB
Ketones, ur: NEGATIVE mg/dL
Leukocytes,Ua: NEGATIVE
Nitrite: NEGATIVE
Protein, ur: >=300 mg/dL — AB
RBC / HPF: >50 RBC/hpf (ref 0–5)
Specific Gravity, Urine: 1.023 (ref 1.005–1.030)
WBC, UA: >50 WBC/hpf (ref 0–5)
pH: 5 (ref 5.0–8.0)

## 2022-06-11 LAB — CBC WITH DIFFERENTIAL/PLATELET
Abs Immature Granulocytes: 0.29 10*3/uL — ABNORMAL HIGH (ref 0.00–0.07)
Basophils Absolute: 0.1 10*3/uL (ref 0.0–0.1)
Basophils Relative: 0 %
Eosinophils Absolute: 0 10*3/uL (ref 0.0–0.5)
Eosinophils Relative: 0 %
HCT: 52 % — ABNORMAL HIGH (ref 36.0–46.0)
Hemoglobin: 18 g/dL — ABNORMAL HIGH (ref 12.0–15.0)
Immature Granulocytes: 2 %
Lymphocytes Relative: 10 %
Lymphs Abs: 1.5 10*3/uL (ref 0.7–4.0)
MCH: 34.7 pg — ABNORMAL HIGH (ref 26.0–34.0)
MCHC: 34.6 g/dL (ref 30.0–36.0)
MCV: 100.2 fL — ABNORMAL HIGH (ref 80.0–100.0)
Monocytes Absolute: 0.9 10*3/uL (ref 0.1–1.0)
Monocytes Relative: 6 %
Neutro Abs: 12.9 10*3/uL — ABNORMAL HIGH (ref 1.7–7.7)
Neutrophils Relative %: 82 %
Platelets: 208 10*3/uL (ref 150–400)
RBC: 5.19 MIL/uL — ABNORMAL HIGH (ref 3.87–5.11)
RDW: 14.7 % (ref 11.5–15.5)
WBC: 15.7 10*3/uL — ABNORMAL HIGH (ref 4.0–10.5)
nRBC: 0 % (ref 0.0–0.2)

## 2022-06-11 LAB — MRSA NEXT GEN BY PCR, NASAL: MRSA by PCR Next Gen: NOT DETECTED

## 2022-06-11 LAB — HEPATITIS PANEL, ACUTE
HCV Ab: NONREACTIVE
Hep A IgM: NONREACTIVE
Hep B C IgM: NONREACTIVE
Hepatitis B Surface Ag: NONREACTIVE

## 2022-06-11 LAB — MAGNESIUM: Magnesium: 1.6 mg/dL — ABNORMAL LOW (ref 1.7–2.4)

## 2022-06-11 MED ORDER — MAGNESIUM SULFATE 2 GM/50ML IV SOLN
2.0000 g | Freq: Once | INTRAVENOUS | Status: AC
  Administered 2022-06-11: 2 g via INTRAVENOUS
  Filled 2022-06-11: qty 50

## 2022-06-11 MED ORDER — METOPROLOL TARTRATE 25 MG PO TABS
25.0000 mg | ORAL_TABLET | Freq: Two times a day (BID) | ORAL | Status: DC
  Administered 2022-06-11 – 2022-06-13 (×4): 25 mg via ORAL
  Filled 2022-06-11 (×5): qty 1

## 2022-06-11 MED ORDER — NICARDIPINE HCL IN NACL 20-0.86 MG/200ML-% IV SOLN
3.0000 mg/h | INTRAVENOUS | Status: DC
  Administered 2022-06-11 (×4): 5 mg/h via INTRAVENOUS
  Administered 2022-06-12: 7.5 mg/h via INTRAVENOUS
  Administered 2022-06-12: 3 mg/h via INTRAVENOUS
  Administered 2022-06-12: 5 mg/h via INTRAVENOUS
  Filled 2022-06-11 (×5): qty 200
  Filled 2022-06-11: qty 400

## 2022-06-11 MED ORDER — METOPROLOL TARTRATE 5 MG/5ML IV SOLN: 5.0000 mg | INTRAVENOUS | Status: DC | PRN

## 2022-06-11 MED ORDER — CLONIDINE HCL 0.2 MG PO TABS
0.2000 mg | ORAL_TABLET | Freq: Two times a day (BID) | ORAL | Status: DC
  Administered 2022-06-11 – 2022-06-23 (×25): 0.2 mg via ORAL
  Filled 2022-06-11 (×25): qty 1

## 2022-06-11 MED ORDER — SODIUM CHLORIDE 0.9 % IV BOLUS
2000.0000 mL | Freq: Once | INTRAVENOUS | Status: AC
  Administered 2022-06-11: 2000 mL via INTRAVENOUS

## 2022-06-11 MED ORDER — POTASSIUM CHLORIDE 10 MEQ/100ML IV SOLN
10.0000 meq | INTRAVENOUS | Status: AC
  Administered 2022-06-11 (×3): 10 meq via INTRAVENOUS
  Filled 2022-06-11 (×3): qty 100

## 2022-06-11 MED ORDER — SODIUM CHLORIDE 0.9 % IV SOLN
1.0000 g | INTRAVENOUS | Status: DC
  Administered 2022-06-11 – 2022-06-14 (×4): 1 g via INTRAVENOUS
  Filled 2022-06-11 (×4): qty 10

## 2022-06-11 MED ORDER — METOPROLOL TARTRATE 5 MG/5ML IV SOLN
5.0000 mg | Freq: Once | INTRAVENOUS | Status: AC
  Administered 2022-06-11: 5 mg via INTRAVENOUS
  Filled 2022-06-11: qty 5

## 2022-06-11 MED ORDER — MORPHINE SULFATE (PF) 4 MG/ML IV SOLN
4.0000 mg | INTRAVENOUS | Status: DC | PRN
  Administered 2022-06-11 – 2022-06-15 (×28): 4 mg via INTRAVENOUS
  Filled 2022-06-11 (×29): qty 1

## 2022-06-11 MED ORDER — IOHEXOL 300 MG/ML  SOLN
100.0000 mL | Freq: Once | INTRAMUSCULAR | Status: AC | PRN
  Administered 2022-06-11: 100 mL via INTRAVENOUS

## 2022-06-11 MED ORDER — DILTIAZEM HCL-DEXTROSE 125-5 MG/125ML-% IV SOLN (PREMIX)
5.0000 mg/h | INTRAVENOUS | Status: DC
  Administered 2022-06-11: 5 mg/h via INTRAVENOUS
  Filled 2022-06-11: qty 125

## 2022-06-11 NOTE — Progress Notes (Signed)
Patient awake and alert. Continue with ST despite PO and IV metoprolol and IV x2 boluses. Patient c/o Abd pain RUQ, pain meds given and noted effective bur pain is constant. Patient's significant other, sister, and mother in to visit and had several questions that writer was able to answer. Patient mobilized at highest level of mobility. Patient is on her menses. Safety maintained, call bell within reach, bed in lowest position, bed alarm on and functioning without any issues. Will continue to monitor and endorse.

## 2022-06-11 NOTE — Progress Notes (Signed)
PROGRESS NOTE    Veronica Calhoun  XTG:[id number] DOB: July 11, 1985 DOA: 06/10/2022 PCP: Pcp, No   Brief Narrative:    Veronica Calhoun is a 37 y.o. female with medical history significant of alcohol dependence, tobacco use disorder, hypertension-not on medication, generalized anxiety disorder-not on medication, and more presents the ED with a chief complaint of abdominal pain.  She does have daily alcohol use and was admitted with alcohol induced acute pancreatitis.  She is also noted to have hypertensive crisis and is now requiring Cardizem drip.  She continues to have some ongoing sinus tachycardia.  It appears that she has been noncompliant with her home blood pressure medications.  Assessment & Plan:   Principal Problem:   Alcohol induced acute pancreatitis Active Problems:   Anxiety   Alcohol use disorder, severe, dependence (HCC)   Acute pancreatitis   Hypertensive crisis   Transaminitis   Tobacco use disorder   Tachycardia   Hypokalemia  Assessment and Plan:   Alcohol induced acute pancreatitis -Appears to be a recurrent problem in the setting of her alcohol use drinks daily -Last use this AM -Abdominal pain, transaminitis, elevated lipase -Abd US shows normal size for common bile duct and normal gall bladder -Patient is not on any medications that would be likely to cause pancreatitis -Advised on the importance of EtOH cessation -Start clear liquid diet -IV fluids -Pain control -Trend lipase -Continue to monitor with a.m. labs   Sinus tachycardia - Related to volume depletion in the setting of above, continue aggressive IV fluid -Also related to pain and some alcohol withdrawal -Patient also has not been taking her home metoprolol which has been resumed -Continue to monitor closely in the ICU -Plan to check TSH   Tobacco use disorder - Smokes 1 pack/day - Nicotine patch ordered - Continue to monitor   Transaminitis-downtrending -AST 308, ALT  115 -related to EtOH and pancreatits -US shows normal CBD  - not likely to have an obstructing stone -If transaminases continue to rise on tomorrow's BMP consider MRCP -Hold hepatotoxic agents when possible -Continue to monitor   Hypertensive crisis -Cardizem drip initiated -Noted to previously be on home clonidine, metoprolol, and HCTZ -Resume on metoprolol and clonidine for now and HCTZ later   Alcohol use disorder, severe, dependence ([place]) -CIWA protocol -Advised on importance of cessation   Anxiety -Ativan given in the ED -Continue ativan per CIWA protocol -Continue to monitor  Obesity BMI 42.02  Mild hypomagnesemia -Replete and reevaluate in a.m.    DVT prophylaxis:Heparin Code Status: Full Family Communication: None at bedside Disposition Plan:  Status is: Inpatient Remains inpatient appropriate because: Need for IV fluid   Consultants:  None  Procedures:  None  Antimicrobials:  None   Subjective: Patient seen and evaluated today with no new acute complaints or concerns. No acute concerns or events noted overnight.  She denies much abdominal pain or nausea or vomiting.  Agreeable to trying clear liquids today.  Continues to remain tachycardic and hypertensive.  Objective: Vitals:   06/11/22 1245 06/11/22 1250 06/11/22 1255 06/11/22 1425  BP:  128/85  (!) 160/87  Pulse:  (!) 138 (!) 132   Resp: (!) 23 (!) 24 (!) 25   Temp:      TempSrc:      SpO2:  97% 97%   Weight:      Height:        Intake/Output Summary (Last 24 hours) at 06/11/2022 1452 Last data filed at 06/11/2022 0313 Gross per  24 hour  Intake 1856.25 ml  Output 100 ml  Net 1756.25 ml   Filed Weights   06/10/22 1312 06/10/22 2119 06/11/22 0500  Weight: 117.9 kg 118.1 kg 118.1 kg    Examination:  General exam: Appears calm and comfortable, obese Respiratory system: Clear to auscultation. Respiratory effort normal. Cardiovascular system: S1 & S2 heard, RRR.   Tachycardic Gastrointestinal system: Abdomen is soft Central nervous system: Alert and awake Extremities: No edema Skin: No significant lesions noted Psychiatry: Flat affect.    Data Reviewed: I have personally reviewed following labs and imaging studies  CBC: Recent Labs  Lab 06/10/22 1341 06/11/22 0350  WBC 8.9 15.7*  NEUTROABS  --  12.9*  HGB 16.1* 18.0*  HCT 45.2 52.0*  MCV 97.4 100.2*  PLT 263 202   Basic Metabolic Panel: Recent Labs  Lab 06/10/22 1341 06/11/22 0350  NA 133* 132*  K 3.3* 4.5  CL 100 101  CO2 19* 18*  GLUCOSE 209* 221*  BUN <5* 8  CREATININE 0.54 0.85  CALCIUM 8.4* 8.2*  MG  --  1.6*   GFR: Estimated Creatinine Clearance: 121.9 mL/min (by C-G formula based on SCr of 0.85 mg/dL). Liver Function Tests: Recent Labs  Lab 06/10/22 1341 06/11/22 0350  AST 308* 142*  ALT 115* 83*  ALKPHOS 169* 133*  BILITOT 1.2 1.2  PROT 8.2* 7.5  ALBUMIN 3.5 3.1*   Recent Labs  Lab 06/10/22 1341 06/10/22 2055  LIPASE 965* 637*   No results for input(s): "AMMONIA" in the last 168 hours. Coagulation Profile: No results for input(s): "INR", "PROTIME" in the last 168 hours. Cardiac Enzymes: No results for input(s): "CKTOTAL", "CKMB", "CKMBINDEX", "TROPONINI" in the last 168 hours. BNP (last 3 results) No results for input(s): "PROBNP" in the last 8760 hours. HbA1C: No results for input(s): "HGBA1C" in the last 72 hours. CBG: No results for input(s): "GLUCAP" in the last 168 hours. Lipid Profile: No results for input(s): "CHOL", "HDL", "LDLCALC", "TRIG", "CHOLHDL", "LDLDIRECT" in the last 72 hours. Thyroid Function Tests: No results for input(s): "TSH", "T4TOTAL", "FREET4", "T3FREE", "THYROIDAB" in the last 72 hours. Anemia Panel: No results for input(s): "VITAMINB12", "FOLATE", "FERRITIN", "TIBC", "IRON", "RETICCTPCT" in the last 72 hours. Sepsis Labs: No results for input(s): "PROCALCITON", "LATICACIDVEN" in the last 168 hours.  Recent Results  (from the past 240 hour(s))  MRSA Next Gen by PCR, Nasal     Status: None   Collection Time: 06/10/22  8:29 PM   Specimen: Nasal Mucosa; Nasal Swab  Result Value Ref Range Status   MRSA by PCR Next Gen NOT DETECTED NOT DETECTED Final    Comment: (NOTE) The GeneXpert MRSA Assay (FDA approved for NASAL specimens only), is one component of a comprehensive MRSA colonization surveillance program. It is not intended to diagnose MRSA infection nor to guide or monitor treatment for MRSA infections. Test performance is not FDA approved in patients less than 65 years old. Performed at [facility], [address], [place], [place] [address]          Radiology Studies: US Abdomen Complete  Result Date: 06/10/2022 CLINICAL DATA:  Epigastric abdominal pain since this morning. EXAM: ABDOMEN ULTRASOUND COMPLETE COMPARISON:  CT scan 12/28/2021 FINDINGS: Gallbladder: No gallstones or wall thickening visualized. No sonographic Murphy sign noted by sonographer. Common bile duct: Diameter: 5.0 mm Liver: There is diffuse increased echogenicity of the liver and decreased through transmission consistent with fatty infiltration. No focal lesions or biliary dilatation. Portal vein is patent on color  Doppler imaging with normal direction of blood flow towards the liver. IVC: Normal caliber Pancreas: Visualized portion unremarkable. Spleen: Not well visualize. Right Kidney: Length: 13.4 cm. Normal renal cortical thickness and echogenicity without focal lesions or hydronephrosis. Left Kidney: Length: 11.4 cm. Normal renal cortical thickness and echogenicity without focal lesions or hydronephrosis. Abdominal aorta: Poorly visualized. Other findings: No ascites IMPRESSION: 1. Diffuse fatty infiltration liver but no hepatic lesions or intrahepatic biliary dilatation. 2. Normal gallbladder. 3. Poorly visualized pancreas, spleen and aorta. 4. Normal kidneys.  No ascites. Electronically Signed   By: Marijo Sanes M.D.   On:  06/10/2022 16:46        Scheduled Meds:  Chlorhexidine Gluconate Cloth  6 each Topical Q0600   cloNIDine  0.2 mg Oral BID   folic acid  1 mg Oral Daily   heparin  5,000 Units Subcutaneous Q8H   LORazepam  0-4 mg Intravenous Q6H   Followed by   Derrill Memo ON 06/13/2022] LORazepam  0-4 mg Intravenous Q12H   metoprolol tartrate  25 mg Oral BID   multivitamin with minerals  1 tablet Oral Daily   nicotine  21 mg Transdermal Daily   pantoprazole (PROTONIX) IV  40 mg Intravenous Q12H   thiamine  100 mg Oral Daily   Or   thiamine  100 mg Intravenous Daily   Continuous Infusions:  sodium chloride 150 mL/hr at 06/11/22 1424   niCARDipine 5 mg/hr (06/11/22 1049)     LOS: 0 days    Time spent: 35 minutes    Shauntea Lok Darleen Crocker, DO [facility]  If 7PM-7AM, please contact night-coverage [email or web address] 06/11/2022, 2:52 PM

## 2022-06-11 NOTE — Progress Notes (Signed)
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 Plan to culture urine and start Rocephin.

## 2022-06-11 NOTE — Progress Notes (Signed)
Pt taken to CT and back w/o issue. VSS

## 2022-06-11 NOTE — Assessment & Plan Note (Signed)
Secondary to decreased p.o. intake in the setting of pancreatitis Replace and recheck

## 2022-06-11 NOTE — TOC Progression Note (Signed)
Transition of Care [facility]) - Progression Note    Patient Details  Name: Veronica Calhoun MRN: [id number] Date of Birth: 1985/10/05  [facility]) CM/SW [place], [place] Phone Number: 06/11/2022, 10:04 AM  Clinical Narrative:    Substance use resources added to pts AVS. TOC also placed PCP list onto pts AVS. TOC signing off.    Barriers to Discharge: No Barriers Identified  Expected Discharge Plan and Services                                               Social Determinants of Health (SDOH) Interventions SDOH Screenings   Food Insecurity: No Food Insecurity (06/10/2022)  Housing: Low Risk  (06/10/2022)  Transportation Needs: No Transportation Needs (06/10/2022)  Utilities: Not At Risk (06/10/2022)  Physical Activity: Inactive (09/02/2017)  Stress: Stress Concern Present (09/02/2017)  Tobacco Use: High Risk (06/10/2022)    Readmission Risk Interventions     No data to display

## 2022-06-11 NOTE — Assessment & Plan Note (Signed)
-   Smokes 1 pack/day - Nicotine patch ordered - Continue to monitor

## 2022-06-11 NOTE — Assessment & Plan Note (Signed)
-   Admission EKG pending - Likely related to withdrawal and pain - CIWA protocol - Pain control with morphine - 5 mg metoprolol given which improved heart rate briefly -Continue to monitor

## 2022-06-11 NOTE — Discharge Instructions (Signed)
  Providers Accepting New Patients in [place], [place]    [facility] [address]  [place], [place] [address] [phone] Accepts most insurances  [facility] [address] [place], [place] [address] [phone] Accepts most insurances  [facility] [place] [facility] [address] [place], [place] [address]  [phone] Must meet requirements  [facility] [address]  [place], [place] [address] [phone] Accepts most insurances  [facility] [address]  [place], [place] [address] [phone] Accepts most insurances  [facility] [address]   [place], [place]   [phone] Accepts most insurances  [facility] ([facility]  [address]  [place], [place] [address] [phone] Accepts most insurances     [facility] [address]  [place], [place] [address] [phone] Accepts most insurances  [facility] [address] [place], [place] [address] [phone] option 1 Accepts Medicaid and Uninsured  [facility] [address]  [place], [place] [address] [phone] Accepts most insurances  Tesfaye Fanta, MD [address]  [place], [place] [address] [phone] Accepts most insurances  [facility] [place] [address]  [place], [place] [address] [phone] Accepts most insurances  [facility] [address] [place], [place] [address] [phone] Accepts most insurances  Zack Hall, MD [address] , [place] [address] [phone]  Accepts most insurances                    

## 2022-06-11 NOTE — H&P (Signed)
History and Physical    Patient: Veronica Calhoun:[id number] DOB: 1986/02/05 DOA: 06/10/2022 DOS: the patient was seen and examined on 06/11/2022 PCP: Pcp, No  Patient coming from: Home  Chief Complaint:  Chief Complaint  Patient presents with   Abdominal Pain   HPI: Veronica Calhoun is a 37 y.o. female with medical history significant of alcohol dependence, tobacco use disorder, hypertension-not on medication, generalized anxiety disorder-not on medication, and more presents the ED with a chief complaint of abdominal pain.  Patient reports she had sudden onset of abdominal pain at 38 AM on 6 February.  At first it was in the bilateral upper quadrants of her abdomen.  As the day went on it focused more on the right upper quadrant of her abdomen.  Pain is sharp.  She thought she was going to pass out because the pain was so bad.  She reports she had nausea with 1 episode of vomiting.  The emesis was nonbloody.  Her last meal was the night of the fifth.  Her last bowel movement was the sixth.  It was nonbloody as well.  Patient drinks 5-6 beers and 4-5 shots of liquor per day.  These numbers are different than the numbers that were given in the ER, and likely still not accurate.  Patient has been counseled on cessation.  She has been counseled on the fact that continued drinking will cause continued episodes of pancreatitis.  Patient does not use illicit drugs.  She is not vaccinated for COVID or flu.  On review of systems she reports that she had just got over his viral illness on 4 February.  She reports that she was coughing up a lot of limb at that time and that is why her voice is hoarse.  She denies having any fevers at that time.  She did not take any new medications at that time.  Patient has no other complaints. Review of Systems: As mentioned in the history of present illness. All other systems reviewed and are negative. Past Medical History:  Diagnosis Date   Anxiety 2013    Generalized anxiety disorder 04/01/2013   Hypertension 2014   Past Surgical History:  Procedure Laterality Date   CESAREAN SECTION  03/18/2007   Social History:  reports that she has been smoking cigarettes. She has a 10.00 pack-year smoking history. She has never used smokeless tobacco. She reports current alcohol use of about 10.0 standard drinks of alcohol per week. She reports that she does not use drugs.  Allergies  Allergen Reactions   Lisinopril Swelling    Reports Lip swelling   Amlodipine Other (See Comments)    "like I was going to pass out"    Family History  Problem Relation Age of Onset   Heart murmur Father    AAA (abdominal aortic aneurysm) Sister    Urinary tract infection Daughter    Hypertension Maternal Grandmother     Prior to Admission medications   Medication Sig Start Date End Date Taking? Authorizing Provider  Aspirin-Salicylamide-Caffeine (BC HEADACHE POWDER PO) Take 1 packet by mouth every 6 (six) hours as needed (pain/headache).   Yes [provider]    Physical Exam: Vitals:   06/11/22 0045 06/11/22 0100 06/11/22 0115 06/11/22 0130  BP: (!) 164/91 (!) 155/90  (!) 158/85  Pulse: (!) 135 (!) 136 (!) 102 (!) 134  Resp: (!) 27 (!) 28 (!) 22 (!) 27  Temp:      TempSrc:  SpO2: 92% 92% 96% 97%  Weight:      Height:       1.  General: Patient lying supine in bed,  no acute distress   2. Psychiatric: Alert and oriented x 3, mood and behavior normal for situation, pleasant and cooperative with exam   3. Neurologic: Speech and language are normal, face is symmetric, moves all 4 extremities voluntarily, at baseline without acute deficits on limited exam   4. HEENMT:  Head is atraumatic, normocephalic, pupils reactive to light, neck is supple, trachea is midline, mucous membranes are moist   5. Respiratory : Lungs are clear to auscultation bilaterally without wheezing, rhonchi, rales, no cyanosis, no increase in work of breathing or  accessory muscle use   6. Cardiovascular : Heart rate tachycardic, rhythm is regular, no murmurs, rubs or gallops, no peripheral edema, peripheral pulses palpated   7. Gastrointestinal:  Abdomen is soft, minimally distended, reports tenderness throughout, no guarding, no grimace, bowel sounds active, no masses or organomegaly palpated   8. Skin:  Skin is warm, dry and intact without rashes, acute lesions, or ulcers on limited exam   9.Musculoskeletal:  No acute deformities or trauma, no asymmetry in tone, no peripheral edema, peripheral pulses palpated, no tenderness to palpation in the extremities  Data Reviewed: In the ED Temp 97.5, heart rate 89-108, respiratory rate 20, blood pressure 189/114-226/139 Satting at 96% on room air No leukocytosis, hemoglobin 16.1 Slight hypokalemia at 3.3 Lipase 965, transaminitis with an AST of 308 and an ALT of 115 Patient was given fentanyl, Dilaudid, hydralazine, labetalol, Ativan, morphine, 1 L normal saline, Zofran in the ED without relief Ultrasound abdomen shows fatty liver but a normal gallbladder and normal-sized common bile duct Admission requested for acute pancreatitis Assessment and Plan: * Alcohol induced acute pancreatitis -drinks daily -Last use this AM -Abdominal pain, transaminitis, elevated lipase -Abd US shows normal size for common bile duct and normal gall bladder -Patient is not on any medications that would be likely to cause pancreatitis -Advised on the importance of EtOH cessation -NPO -IV fluids -Pain control -Trend lipase -Continue to monitor  Hypokalemia Secondary to decreased p.o. intake in the setting of pancreatitis Replace and recheck  Tachycardia - Admission EKG pending - Likely related to withdrawal and pain - CIWA protocol - Pain control with morphine - 5 mg metoprolol given which improved heart rate briefly -Continue to monitor  Tobacco use disorder - Smokes 1 pack/day - Nicotine patch  ordered - Continue to monitor  Transaminitis -AST 308, ALT 115 -related to EtOH and pancreatits -US shows normal CBD  - not likely to have an obstructing stone -If transaminases continue to rise on tomorrow's BMP consider MRCP -Hold hepatotoxic agents when possible -Continue to monitor  Hypertensive crisis -BP 189/114 - 226/139 -labetalol, hydralazine and control administered in ED -Related to acute illness vs withdrawal -Non adherence to med regimen also contributing -Diltiazem at admission -Patient admitted to step down as she may end up needing Cardene drip for BP  Alcohol use disorder, severe, dependence ([place]) -CIWA protocol -Advised on importance of cessation  Anxiety -Ativan given in the ED -Continue ativan per CIWA protocol -Continue to monitor      Advance Care Planning:   Code Status: Full Code  Consults: None at this time  Family Communication: No family at bedside  Severity of Illness: The appropriate patient status for this patient is OBSERVATION. Observation status is judged to be reasonable and necessary in order to provide the  required intensity of service to ensure the patient's safety. The patient's presenting symptoms, physical exam findings, and initial radiographic and laboratory data in the context of their medical condition is felt to place them at decreased risk for further clinical deterioration. Furthermore, it is anticipated that the patient will be medically stable for discharge from the hospital within 2 midnights of admission.   Author: Rolla Plate, DO 06/11/2022 1:57 AM  For on call review [email or web address].

## 2022-06-12 DIAGNOSIS — K852 Alcohol induced acute pancreatitis without necrosis or infection: Secondary | ICD-10-CM | POA: Diagnosis not present

## 2022-06-12 LAB — COMPREHENSIVE METABOLIC PANEL
ALT: 45 U/L — ABNORMAL HIGH (ref 0–44)
AST: 65 U/L — ABNORMAL HIGH (ref 15–41)
Albumin: 2.3 g/dL — ABNORMAL LOW (ref 3.5–5.0)
Alkaline Phosphatase: 100 U/L (ref 38–126)
Anion gap: 8 (ref 5–15)
BUN: 18 mg/dL (ref 6–20)
CO2: 20 mmol/L — ABNORMAL LOW (ref 22–32)
Calcium: 6.8 mg/dL — ABNORMAL LOW (ref 8.9–10.3)
Chloride: 105 mmol/L (ref 98–111)
Creatinine, Ser: 1.06 mg/dL — ABNORMAL HIGH (ref 0.44–1.00)
GFR, Estimated: >60 mL/min (ref >60)
Glucose, Bld: 154 mg/dL — ABNORMAL HIGH (ref 70–99)
Potassium: 4.5 mmol/L (ref 3.5–5.1)
Sodium: 133 mmol/L — ABNORMAL LOW (ref 135–145)
Total Bilirubin: 2.6 mg/dL — ABNORMAL HIGH (ref 0.3–1.2)
Total Protein: 5.9 g/dL — ABNORMAL LOW (ref 6.5–8.1)

## 2022-06-12 LAB — CBC
HCT: 45.6 % (ref 36.0–46.0)
Hemoglobin: 15.5 g/dL — ABNORMAL HIGH (ref 12.0–15.0)
MCH: 35.3 pg — ABNORMAL HIGH (ref 26.0–34.0)
MCHC: 34 g/dL (ref 30.0–36.0)
MCV: 103.9 fL — ABNORMAL HIGH (ref 80.0–100.0)
Platelets: 112 10*3/uL — ABNORMAL LOW (ref 150–400)
RBC: 4.39 MIL/uL (ref 3.87–5.11)
RDW: 15.2 % (ref 11.5–15.5)
WBC: 22.1 10*3/uL — ABNORMAL HIGH (ref 4.0–10.5)
nRBC: 0 % (ref 0.0–0.2)

## 2022-06-12 LAB — MAGNESIUM: Magnesium: 1.9 mg/dL (ref 1.7–2.4)

## 2022-06-12 LAB — TSH: TSH: 4.561 u[IU]/mL — ABNORMAL HIGH (ref 0.350–4.500)

## 2022-06-12 MED ORDER — HYDROMORPHONE HCL 1 MG/ML IJ SOLN
0.5000 mg | INTRAMUSCULAR | Status: DC | PRN
  Administered 2022-06-12 – 2022-06-15 (×18): 0.5 mg via INTRAVENOUS
  Filled 2022-06-12 (×20): qty 0.5

## 2022-06-12 NOTE — Progress Notes (Signed)
PROGRESS NOTE    Veronica Calhoun  GLO:[id number] DOB: 18-Jan-1986 DOA: 06/10/2022 PCP: Pcp, No   Brief Narrative:    Veronica Calhoun is a 37 y.o. female with medical history significant of alcohol dependence, tobacco use disorder, hypertension-not on medication, generalized anxiety disorder-not on medication, and more presents the ED with a chief complaint of abdominal pain.  She does have daily alcohol use and was admitted with alcohol induced acute pancreatitis.  She is also noted to have hypertensive crisis and is now requiring Cardizem drip.  She continues to have some ongoing sinus tachycardia.  It appears that she has been noncompliant with her home blood pressure medications.  CT abdomen and pelvis performed 2/7 with findings of severe pancreatitis and emphysematous cystitis.  Started on Rocephin with urine cultures pending.  Assessment & Plan:   Principal Problem:   Alcohol induced acute pancreatitis Active Problems:   Anxiety   Alcohol use disorder, severe, dependence (HCC)   Acute pancreatitis   Hypertensive crisis   Transaminitis   Tobacco use disorder   Tachycardia   Hypokalemia  Assessment and Plan:   Alcohol induced acute severe pancreatitis -Appears to be a recurrent problem in the setting of her alcohol use drinks daily -Abdominal pain, transaminitis, elevated lipase -Abd US shows normal size for common bile duct and normal gall bladder -Patient is not on any medications that would be likely to cause pancreatitis -Advised on the importance of EtOH cessation -Continue clear liquids -Aggressive IV fluids -Pain control -Trend lipase -Continue to monitor with a.m. labs   Sinus tachycardia - Related to volume depletion in the setting of above, continue aggressive IV fluid -Also related to pain and some alcohol withdrawal -Patient also has not been taking her home metoprolol which has been resumed -Continue to monitor closely in the ICU -Plan to check  TSH  AKI Creatinine upward trending Monitor strict I's and O's Avoid nephrotoxic agents Continue to follow labs and continue aggressive IV fluid  Emphysematous cystitis -Treat empirically with Rocephin and follow urine cultures -Monitor CBC -Blood cultures also ordered   Tobacco use disorder - Smokes 1 pack/day - Nicotine patch ordered - Continue to monitor   Transaminitis-downtrending -related to EtOH and pancreatits -US shows normal CBD  - not likely to have an obstructing stone -If transaminases continue to rise on tomorrow's BMP consider MRCP -Hold hepatotoxic agents when possible -Continue to monitor   Hypertensive crisis -Cardizem drip initiated -Noted to previously be on home clonidine, metoprolol, and HCTZ -Resume on metoprolol and clonidine for now and HCTZ later -Wean Cardizem as tolerated   Alcohol use disorder, severe, dependence (HCC) -CIWA protocol -Advised on importance of cessation   Anxiety -Ativan given in the ED -Continue ativan per CIWA protocol -Continue to monitor  Obesity BMI 42.02    DVT prophylaxis:Heparin Code Status: Full Family Communication: None at bedside Disposition Plan:  Status is: Inpatient Remains inpatient appropriate because: Need for IV fluid   Consultants:  None  Procedures:  None  Antimicrobials:  Anti-infectives (From admission, onward)    Start     Dose/Rate Route Frequency Ordered Stop   06/11/22 2145  cefTRIAXone (ROCEPHIN) 1 g in sodium chloride 0.9 % 100 mL IVPB        1 g 200 mL/hr over 30 Minutes Intravenous Every 24 hours 06/11/22 2053         Subjective: Patient seen and evaluated today with no new acute complaints or concerns. No acute concerns or events noted overnight.  She denies much abdominal pain or nausea or vomiting.  Appears to be tolerating clear liquid diet.  Continues to remain tachycardic, but blood pressures are improved  Objective: Vitals:   06/12/22 1030 06/12/22 1100 06/12/22  1130 06/12/22 1145  BP: 127/83 123/62  (!) 141/89  Pulse: (!) 122 (!) 117 (!) 110 (!) 114  Resp: (!) 23 (!) 23 (!) 24 (!) 25  Temp:      TempSrc:      SpO2: 95% 96% 93% 96%  Weight:      Height:        Intake/Output Summary (Last 24 hours) at 06/12/2022 1247 Last data filed at 06/12/2022 1100 Gross per 24 hour  Intake 7058.92 ml  Output 1200 ml  Net 5858.92 ml   Filed Weights   06/10/22 1312 06/10/22 2119 06/11/22 0500  Weight: 117.9 kg 118.1 kg 118.1 kg    Examination:  General exam: Appears calm and comfortable, obese Respiratory system: Clear to auscultation. Respiratory effort normal. Cardiovascular system: S1 & S2 heard, RRR.  Tachycardic Gastrointestinal system: Abdomen is soft Central nervous system: Alert and awake Extremities: No edema Skin: No significant lesions noted Psychiatry: Flat affect.    Data Reviewed: I have personally reviewed following labs and imaging studies  CBC: Recent Labs  Lab 06/10/22 1341 06/11/22 0350 06/12/22 0446  WBC 8.9 15.7* 22.1*  NEUTROABS  --  12.9*  --   HGB 16.1* 18.0* 15.5*  HCT 45.2 52.0* 45.6  MCV 97.4 100.2* 103.9*  PLT 263 208 735*   Basic Metabolic Panel: Recent Labs  Lab 06/10/22 1341 06/11/22 0350 06/12/22 0446  NA 133* 132* 133*  K 3.3* 4.5 4.5  CL 100 101 105  CO2 19* 18* 20*  GLUCOSE 209* 221* 154*  BUN <5* 8 18  CREATININE 0.54 0.85 1.06*  CALCIUM 8.4* 8.2* 6.8*  MG  --  1.6* 1.9   GFR: Estimated Creatinine Clearance: 97.8 mL/min (A) (by C-G formula based on SCr of 1.06 mg/dL (H)). Liver Function Tests: Recent Labs  Lab 06/10/22 1341 06/11/22 0350 06/12/22 0446  AST 308* 142* 65*  ALT 115* 83* 45*  ALKPHOS 169* 133* 100  BILITOT 1.2 1.2 2.6*  PROT 8.2* 7.5 5.9*  ALBUMIN 3.5 3.1* 2.3*   Recent Labs  Lab 06/10/22 1341 06/10/22 2055  LIPASE 965* 637*   No results for input(s): "AMMONIA" in the last 168 hours. Coagulation Profile: No results for input(s): "INR", "PROTIME" in the last  168 hours. Cardiac Enzymes: No results for input(s): "CKTOTAL", "CKMB", "CKMBINDEX", "TROPONINI" in the last 168 hours. BNP (last 3 results) No results for input(s): "PROBNP" in the last 8760 hours. HbA1C: No results for input(s): "HGBA1C" in the last 72 hours. CBG: No results for input(s): "GLUCAP" in the last 168 hours. Lipid Profile: No results for input(s): "CHOL", "HDL", "LDLCALC", "TRIG", "CHOLHDL", "LDLDIRECT" in the last 72 hours. Thyroid Function Tests: No results for input(s): "TSH", "T4TOTAL", "FREET4", "T3FREE", "THYROIDAB" in the last 72 hours. Anemia Panel: No results for input(s): "VITAMINB12", "FOLATE", "FERRITIN", "TIBC", "IRON", "RETICCTPCT" in the last 72 hours. Sepsis Labs: No results for input(s): "PROCALCITON", "LATICACIDVEN" in the last 168 hours.  Recent Results (from the past 240 hour(s))  MRSA Next Gen by PCR, Nasal     Status: None   Collection Time: 06/10/22  8:29 PM   Specimen: Nasal Mucosa; Nasal Swab  Result Value Ref Range Status   MRSA by PCR Next Gen NOT DETECTED NOT DETECTED Final    Comment: (NOTE)  The GeneXpert MRSA Assay (FDA approved for NASAL specimens only), is one component of a comprehensive MRSA colonization surveillance program. It is not intended to diagnose MRSA infection nor to guide or monitor treatment for MRSA infections. Test performance is not FDA approved in patients less than 38 years old. Performed at [facility], [address], [place], [place] [address]          Radiology Studies: CT ABDOMEN PELVIS W CONTRAST  Result Date: 06/11/2022 CLINICAL DATA:  Pancreatitis, abdominal pain, alcohol abuse EXAM: CT ABDOMEN AND PELVIS WITH CONTRAST TECHNIQUE: Multidetector CT imaging of the abdomen and pelvis was performed using the standard protocol following bolus administration of intravenous contrast. RADIATION DOSE REDUCTION: This exam was performed according to the departmental dose-optimization program which includes automated  exposure control, adjustment of the mA and/or kV according to patient size and/or use of iterative reconstruction technique. CONTRAST:  156mL OMNIPAQUE IOHEXOL 300 MG/ML  SOLN COMPARISON:  06/10/2022, 12/28/2021 FINDINGS: Lower chest: Hypoventilatory changes at the lung bases. Trace left pleural effusion. Hepatobiliary: Hepatic steatosis. Stable hepatomegaly. No biliary duct dilation. The gallbladder is unremarkable. Pancreas: Heterogeneous decreased enhancement within the head and uncinate process of the pancreas consistent with interstitial edema and underlying pancreatitis. There is marked peripancreatic fat stranding and free fluid. No organized fluid collection, abscess, or pseudocyst at this time. No pancreatic duct dilation. Spleen: Normal in size without focal abnormality. Adrenals/Urinary Tract: Punctate less than 2 mm nonobstructing left renal calculus. No obstructive uropathy within either kidney. The adrenals are grossly normal. The bladder is moderately distended, with intramural gas seen throughout the dependent portion of the gallbladder concerning for emphysematous cystitis. A small amount of intraluminal gas within the bladder may reflect recent catheterization. Please correlate with urinalysis. Stomach/Bowel: No bowel obstruction or ileus. There is secondary wall thickening of the duodenum adjacent to the acute pancreatitis described above. No other wall thickening. Vascular/Lymphatic: No significant vascular findings are present. No enlarged abdominal or pelvic lymph nodes. Reproductive: Uterus and bilateral adnexa are unremarkable. Other: Small volume ascites throughout the lower abdomen and pelvis. No free intraperitoneal gas. No abdominal wall hernia. Musculoskeletal: No acute or destructive bony lesions. Reconstructed images demonstrate no additional findings. IMPRESSION: 1. Severe acute pancreatitis, with edematous changes throughout the head and uncinate process of the pancreas. No evidence  of necrosis, fluid collection, abscess, or pseudocyst at this time. 2. Intramural gas within the posterior bladder wall, compatible with emphysematous cystitis. Please correlate with urinalysis. 3. Lower abdominal and pelvic ascites. 4. Trace left pleural effusion. 5. Stable hepatic steatosis. 6. Punctate less than 2 mm nonobstructing left renal calculus. These results will be called to the ordering clinician or representative by the Radiologist Assistant, and communication documented in the PACS or [employer]. Electronically Signed   By: Randa Ngo M.D.   On: 06/11/2022 20:29   US Abdomen Complete  Result Date: 06/10/2022 CLINICAL DATA:  Epigastric abdominal pain since this morning. EXAM: ABDOMEN ULTRASOUND COMPLETE COMPARISON:  CT scan 12/28/2021 FINDINGS: Gallbladder: No gallstones or wall thickening visualized. No sonographic Murphy sign noted by sonographer. Common bile duct: Diameter: 5.0 mm Liver: There is diffuse increased echogenicity of the liver and decreased through transmission consistent with fatty infiltration. No focal lesions or biliary dilatation. Portal vein is patent on color Doppler imaging with normal direction of blood flow towards the liver. IVC: Normal caliber Pancreas: Visualized portion unremarkable. Spleen: Not well visualize. Right Kidney: Length: 13.4 cm. Normal renal cortical thickness and echogenicity without focal lesions or  hydronephrosis. Left Kidney: Length: 11.4 cm. Normal renal cortical thickness and echogenicity without focal lesions or hydronephrosis. Abdominal aorta: Poorly visualized. Other findings: No ascites IMPRESSION: 1. Diffuse fatty infiltration liver but no hepatic lesions or intrahepatic biliary dilatation. 2. Normal gallbladder. 3. Poorly visualized pancreas, spleen and aorta. 4. Normal kidneys.  No ascites. Electronically Signed   By: Marijo Sanes M.D.   On: 06/10/2022 16:46        Scheduled Meds:  Chlorhexidine Gluconate Cloth  6 each Topical  Q0600   cloNIDine  0.2 mg Oral BID   folic acid  1 mg Oral Daily   heparin  5,000 Units Subcutaneous Q8H   LORazepam  0-4 mg Intravenous Q6H   Followed by   Derrill Memo ON 06/13/2022] LORazepam  0-4 mg Intravenous Q12H   metoprolol tartrate  25 mg Oral BID   multivitamin with minerals  1 tablet Oral Daily   nicotine  21 mg Transdermal Daily   pantoprazole (PROTONIX) IV  40 mg Intravenous Q12H   thiamine  100 mg Oral Daily   Or   thiamine  100 mg Intravenous Daily   Continuous Infusions:  sodium chloride 150 mL/hr at 06/12/22 1100   cefTRIAXone (ROCEPHIN)  IV Stopped (06/11/22 2230)   niCARDipine 3 mg/hr (06/12/22 1100)     LOS: 1 day    Time spent: 35 minutes    Naidelin Gugliotta Darleen Crocker, DO [facility]  If 7PM-7AM, please contact night-coverage [email or web address] 06/12/2022, 12:47 PM

## 2022-06-13 DIAGNOSIS — K852 Alcohol induced acute pancreatitis without necrosis or infection: Secondary | ICD-10-CM | POA: Diagnosis not present

## 2022-06-13 LAB — CBC
HCT: 41.8 % (ref 36.0–46.0)
Hemoglobin: 14.4 g/dL (ref 12.0–15.0)
MCH: 35.1 pg — ABNORMAL HIGH (ref 26.0–34.0)
MCHC: 34.4 g/dL (ref 30.0–36.0)
MCV: 102 fL — ABNORMAL HIGH (ref 80.0–100.0)
Platelets: 102 K/uL — ABNORMAL LOW (ref 150–400)
RBC: 4.1 MIL/uL (ref 3.87–5.11)
RDW: 14.8 % (ref 11.5–15.5)
WBC: 19.8 K/uL — ABNORMAL HIGH (ref 4.0–10.5)
nRBC: 0.1 % (ref 0.0–0.2)

## 2022-06-13 LAB — MAGNESIUM: Magnesium: 1.8 mg/dL (ref 1.7–2.4)

## 2022-06-13 LAB — COMPREHENSIVE METABOLIC PANEL
ALT: 36 U/L (ref 0–44)
AST: 59 U/L — ABNORMAL HIGH (ref 15–41)
Albumin: 2.2 g/dL — ABNORMAL LOW (ref 3.5–5.0)
Alkaline Phosphatase: 95 U/L (ref 38–126)
Anion gap: 7 (ref 5–15)
BUN: 27 mg/dL — ABNORMAL HIGH (ref 6–20)
CO2: 16 mmol/L — ABNORMAL LOW (ref 22–32)
Calcium: 6.7 mg/dL — ABNORMAL LOW (ref 8.9–10.3)
Chloride: 108 mmol/L (ref 98–111)
Creatinine, Ser: 0.94 mg/dL (ref 0.44–1.00)
GFR, Estimated: >60 mL/min (ref >60)
Glucose, Bld: 82 mg/dL (ref 70–99)
Potassium: 4.4 mmol/L (ref 3.5–5.1)
Sodium: 131 mmol/L — ABNORMAL LOW (ref 135–145)
Total Bilirubin: 4.5 mg/dL — ABNORMAL HIGH (ref 0.3–1.2)
Total Protein: 5.9 g/dL — ABNORMAL LOW (ref 6.5–8.1)

## 2022-06-13 LAB — T4, FREE: Free T4: 0.98 ng/dL (ref 0.61–1.12)

## 2022-06-13 MED ORDER — METOPROLOL TARTRATE 50 MG PO TABS
50.0000 mg | ORAL_TABLET | Freq: Two times a day (BID) | ORAL | Status: DC
  Administered 2022-06-13 – 2022-06-23 (×20): 50 mg via ORAL
  Filled 2022-06-13 (×20): qty 1

## 2022-06-13 MED ORDER — POLYETHYLENE GLYCOL 3350 17 G PO PACK
17.0000 g | PACK | Freq: Every day | ORAL | Status: DC
  Administered 2022-06-13 – 2022-06-17 (×3): 17 g via ORAL
  Filled 2022-06-13 (×4): qty 1

## 2022-06-13 NOTE — Progress Notes (Signed)
PROGRESS NOTE    Veronica Calhoun  [id number] DOB: Oct 30, 1985 DOA: 06/10/2022 PCP: Pcp, No   Brief Narrative:    Veronica Calhoun is a 37 y.o. female with medical history significant of alcohol dependence, tobacco use disorder, hypertension-not on medication, generalized anxiety disorder-not on medication, and more presents the ED with a chief complaint of abdominal pain.  She does have daily alcohol use and was admitted with alcohol induced acute pancreatitis.  She is also noted to have hypertensive crisis and is now requiring Cardizem drip.  She continues to have some ongoing sinus tachycardia.  It appears that she has been noncompliant with her home blood pressure medications.  CT abdomen and pelvis performed 2/7 with findings of severe pancreatitis and emphysematous cystitis.  Started on Rocephin with urine cultures showing E Coli.  Assessment & Plan:   Principal Problem:   Alcohol induced acute pancreatitis Active Problems:   Anxiety   Alcohol use disorder, severe, dependence (HCC)   Acute pancreatitis   Hypertensive crisis   Transaminitis   Tobacco use disorder   Tachycardia   Hypokalemia  Assessment and Plan:   Alcohol induced acute severe pancreatitis -Appears to be a recurrent problem in the setting of her alcohol use drinks daily -Abdominal pain, transaminitis, elevated lipase -Abd US shows normal size for common bile duct and normal gall bladder -Patient is not on any medications that would be likely to cause pancreatitis -Advised on the importance of EtOH cessation -Continue clear liquids -Aggressive IV fluids to continue -Pain control -Consider repeat CT in a.m. if not improving with possible need for GI evaluation as well -Continue to monitor with a.m. labs   Sinus tachycardia ongoing -Okay for transfer to telemetry today - Related to volume depletion in the setting of above, continue aggressive IV fluid -Also related to pain and some alcohol  withdrawal -Patient also has not been taking her home metoprolol which has been resumed -Continue to monitor closely in the ICU -TSH elevated at 4.561, check free T4 -Increased metoprolol to 50 mg twice daily 2/9  AKI-resolving Monitor strict I's and O's Avoid nephrotoxic agents Continue to follow labs and continue aggressive IV fluid  Emphysematous cystitis with E. coli UTI -Treat empirically with Rocephin and follow sensitivities -Monitor CBC -Blood cultures also ordered   Tobacco use disorder - Smokes 1 pack/day - Nicotine patch ordered - Continue to monitor   Transaminitis-downtrending -related to EtOH and pancreatits -US shows normal CBD  - not likely to have an obstructing stone -If transaminases continue to rise on tomorrow's BMP consider MRCP -Hold hepatotoxic agents when possible -Continue to monitor   Hypertensive crisis-resolved -Noted to previously be on home clonidine, metoprolol, and HCTZ -Resume on metoprolol and clonidine for now and HCTZ later -Increased metoprolol with noted ongoing sinus tachycardia to 50 mg twice daily   Alcohol use disorder, severe, dependence ([place]) -CIWA protocol -Advised on importance of cessation   Anxiety -Ativan given in the ED -Continue ativan per CIWA protocol -Continue to monitor  Obesity BMI 42.02    DVT prophylaxis:Heparin Code Status: Full Family Communication: None at bedside Disposition Plan:  Status is: Inpatient Remains inpatient appropriate because: Need for IV fluid   Consultants:  None  Procedures:  None  Antimicrobials:  Anti-infectives (From admission, onward)    Start     Dose/Rate Route Frequency Ordered Stop   06/11/22 2145  cefTRIAXone (ROCEPHIN) 1 g in sodium chloride 0.9 % 100 mL IVPB  1 g 200 mL/hr over 30 Minutes Intravenous Every 24 hours 06/11/22 2053         Subjective: Patient seen and evaluated today with no new acute complaints or concerns. No acute concerns or events  noted overnight.  She denies much abdominal pain or nausea or vomiting.  Appears to be tolerating clear liquid diet.  Continues to remain tachycardic, but blood pressures are improved  Objective: Vitals:   06/13/22 0600 06/13/22 0753 06/13/22 0805 06/13/22 0900  BP: 122/62  (!) 143/72 (!) 145/90  Pulse: (!) 129  (!) 121 (!) 119  Resp: (!) 27  (!) 25 (!) 22  Temp:  98 F (36.7 C)    TempSrc:  Oral    SpO2: 95%  95% 94%  Weight:      Height:        Intake/Output Summary (Last 24 hours) at 06/13/2022 1057 Last data filed at 06/13/2022 [id number] Gross per 24 hour  Intake 3501.9 ml  Output 550 ml  Net 2951.9 ml   Filed Weights   06/10/22 1312 06/10/22 2119 06/11/22 0500  Weight: 117.9 kg 118.1 kg 118.1 kg    Examination:  General exam: Appears calm and comfortable, obese Respiratory system: Clear to auscultation. Respiratory effort normal. Cardiovascular system: S1 & S2 heard, RRR.  Tachycardic Gastrointestinal system: Abdomen is soft Central nervous system: Alert and awake Extremities: No edema Skin: No significant lesions noted Psychiatry: Flat affect.    Data Reviewed: I have personally reviewed following labs and imaging studies  CBC: Recent Labs  Lab 06/10/22 1341 06/11/22 0350 06/12/22 0446 06/13/22 0524  WBC 8.9 15.7* 22.1* 19.8*  NEUTROABS  --  12.9*  --   --   HGB 16.1* 18.0* 15.5* 14.4  HCT 45.2 52.0* 45.6 41.8  MCV 97.4 100.2* 103.9* 102.0*  PLT 263 208 112* [id number]*   Basic Metabolic Panel: Recent Labs  Lab 06/10/22 1341 06/11/22 0350 06/12/22 0446 06/13/22 0524  NA 133* 132* 133* 131*  K 3.3* 4.5 4.5 4.4  CL 100 101 105 108  CO2 19* 18* 20* 16*  GLUCOSE 209* 221* 154* 82  BUN <5* 8 18 27*  CREATININE 0.54 0.85 1.06* 0.94  CALCIUM 8.4* 8.2* 6.8* 6.7*  MG  --  1.6* 1.9 1.8   GFR: Estimated Creatinine Clearance: 110.2 mL/min (by C-G formula based on SCr of 0.94 mg/dL). Liver Function Tests: Recent Labs  Lab 06/10/22 1341 06/11/22 0350  06/12/22 0446 06/13/22 0524  AST 308* 142* 65* 59*  ALT 115* 83* 45* 36  ALKPHOS 169* 133* 100 95  BILITOT 1.2 1.2 2.6* 4.5*  PROT 8.2* 7.5 5.9* 5.9*  ALBUMIN 3.5 3.1* 2.3* 2.2*   Recent Labs  Lab 06/10/22 1341 06/10/22 2055  LIPASE 965* 637*   No results for input(s): "AMMONIA" in the last 168 hours. Coagulation Profile: No results for input(s): "INR", "PROTIME" in the last 168 hours. Cardiac Enzymes: No results for input(s): "CKTOTAL", "CKMB", "CKMBINDEX", "TROPONINI" in the last 168 hours. BNP (last 3 results) No results for input(s): "PROBNP" in the last 8760 hours. HbA1C: No results for input(s): "HGBA1C" in the last 72 hours. CBG: No results for input(s): "GLUCAP" in the last 168 hours. Lipid Profile: No results for input(s): "CHOL", "HDL", "LDLCALC", "TRIG", "CHOLHDL", "LDLDIRECT" in the last 72 hours. Thyroid Function Tests: Recent Labs    06/12/22 1322  TSH 4.561*   Anemia Panel: No results for input(s): "VITAMINB12", "FOLATE", "FERRITIN", "TIBC", "IRON", "RETICCTPCT" in the last 72 hours. Sepsis Labs: No  results for input(s): "PROCALCITON", "LATICACIDVEN" in the last 168 hours.  Recent Results (from the past 240 hour(s))  MRSA Next Gen by PCR, Nasal     Status: None   Collection Time: 06/10/22  8:29 PM   Specimen: Nasal Mucosa; Nasal Swab  Result Value Ref Range Status   MRSA by PCR Next Gen NOT DETECTED NOT DETECTED Final    Comment: (NOTE) The GeneXpert MRSA Assay (FDA approved for NASAL specimens only), is one component of a comprehensive MRSA colonization surveillance program. It is not intended to diagnose MRSA infection nor to guide or monitor treatment for MRSA infections. Test performance is not FDA approved in patients less than 15 years old. Performed at [facility], [address], [place], [place] [address]   Urine Culture (for pregnant, neutropenic or urologic patients or patients with an indwelling urinary catheter)     Status: Abnormal  (Preliminary result)   Collection Time: 06/11/22  9:40 PM   Specimen: Urine, Clean Catch  Result Value Ref Range Status   Specimen Description   Final    URINE, CLEAN CATCH Performed at [facility], [address], [place], [place] [address]    Special Requests   Final    NONE Performed at [facility], [address] [place], [place] [address]    Culture (A)  Final    >=100,000 COLONIES/mL ESCHERICHIA COLI SUSCEPTIBILITIES TO FOLLOW Performed at [facility] [address] [place],  [address]    Report Status PENDING  Incomplete         Radiology Studies: CT ABDOMEN PELVIS W CONTRAST  Result Date: 06/11/2022 CLINICAL DATA:  Pancreatitis, abdominal pain, alcohol abuse EXAM: CT ABDOMEN AND PELVIS WITH CONTRAST TECHNIQUE: Multidetector CT imaging of the abdomen and pelvis was performed using the standard protocol following bolus administration of intravenous contrast. RADIATION DOSE REDUCTION: This exam was performed according to the departmental dose-optimization program which includes automated exposure control, adjustment of the mA and/or kV according to patient size and/or use of iterative reconstruction technique. CONTRAST:  165m OMNIPAQUE IOHEXOL 300 MG/ML  SOLN COMPARISON:  06/10/2022, 12/28/2021 FINDINGS: Lower chest: Hypoventilatory changes at the lung bases. Trace left pleural effusion. Hepatobiliary: Hepatic steatosis. Stable hepatomegaly. No biliary duct dilation. The gallbladder is unremarkable. Pancreas: Heterogeneous decreased enhancement within the head and uncinate process of the pancreas consistent with interstitial edema and underlying pancreatitis. There is marked peripancreatic fat stranding and free fluid. No organized fluid collection, abscess, or pseudocyst at this time. No pancreatic duct dilation. Spleen: Normal in size without focal abnormality. Adrenals/Urinary Tract: Punctate less than 2 mm nonobstructing left renal calculus. No obstructive  uropathy within either kidney. The adrenals are grossly normal. The bladder is moderately distended, with intramural gas seen throughout the dependent portion of the gallbladder concerning for emphysematous cystitis. A small amount of intraluminal gas within the bladder may reflect recent catheterization. Please correlate with urinalysis. Stomach/Bowel: No bowel obstruction or ileus. There is secondary wall thickening of the duodenum adjacent to the acute pancreatitis described above. No other wall thickening. Vascular/Lymphatic: No significant vascular findings are present. No enlarged abdominal or pelvic lymph nodes. Reproductive: Uterus and bilateral adnexa are unremarkable. Other: Small volume ascites throughout the lower abdomen and pelvis. No free intraperitoneal gas. No abdominal wall hernia. Musculoskeletal: No acute or destructive bony lesions. Reconstructed images demonstrate no additional findings. IMPRESSION: 1. Severe acute pancreatitis, with edematous changes throughout the head and uncinate process of the pancreas. No evidence of necrosis, fluid collection, abscess,  or pseudocyst at this time. 2. Intramural gas within the posterior bladder wall, compatible with emphysematous cystitis. Please correlate with urinalysis. 3. Lower abdominal and pelvic ascites. 4. Trace left pleural effusion. 5. Stable hepatic steatosis. 6. Punctate less than 2 mm nonobstructing left renal calculus. These results will be called to the ordering clinician or representative by the Radiologist Assistant, and communication documented in the PACS or [employer]. Electronically Signed   By: Randa Ngo M.D.   On: 06/11/2022 20:29        Scheduled Meds:  Chlorhexidine Gluconate Cloth  6 each Topical Q0600   cloNIDine  0.2 mg Oral BID   folic acid  1 mg Oral Daily   heparin  5,000 Units Subcutaneous Q8H   LORazepam  0-4 mg Intravenous Q12H   metoprolol tartrate  50 mg Oral BID   multivitamin with minerals  1  tablet Oral Daily   nicotine  21 mg Transdermal Daily   pantoprazole (PROTONIX) IV  40 mg Intravenous Q12H   thiamine  100 mg Oral Daily   Or   thiamine  100 mg Intravenous Daily   Continuous Infusions:  sodium chloride 150 mL/hr at 06/13/22 0800   cefTRIAXone (ROCEPHIN)  IV Stopped (06/12/22 2142)   niCARDipine Stopped (06/12/22 1107)     LOS: 2 days    Time spent: 35 minutes    Veronica Deringer Darleen Crocker, Veronica Calhoun [facility]  If 7PM-7AM, please contact night-coverage [email or web address] 06/13/2022, 10:57 AM

## 2022-06-14 DIAGNOSIS — K852 Alcohol induced acute pancreatitis without necrosis or infection: Secondary | ICD-10-CM | POA: Diagnosis not present

## 2022-06-14 LAB — D-DIMER, QUANTITATIVE: D-Dimer, Quant: >20 ug/mL-FEU — ABNORMAL HIGH (ref 0.00–0.50)

## 2022-06-14 LAB — COMPREHENSIVE METABOLIC PANEL
ALT: 32 U/L (ref 0–44)
AST: 56 U/L — ABNORMAL HIGH (ref 15–41)
Albumin: 2 g/dL — ABNORMAL LOW (ref 3.5–5.0)
Alkaline Phosphatase: 94 U/L (ref 38–126)
Anion gap: 9 (ref 5–15)
BUN: 16 mg/dL (ref 6–20)
CO2: 18 mmol/L — ABNORMAL LOW (ref 22–32)
Calcium: 7.3 mg/dL — ABNORMAL LOW (ref 8.9–10.3)
Chloride: 105 mmol/L (ref 98–111)
Creatinine, Ser: 0.7 mg/dL (ref 0.44–1.00)
GFR, Estimated: >60 mL/min (ref >60)
Glucose, Bld: 68 mg/dL — ABNORMAL LOW (ref 70–99)
Potassium: 4 mmol/L (ref 3.5–5.1)
Sodium: 132 mmol/L — ABNORMAL LOW (ref 135–145)
Total Bilirubin: 7.6 mg/dL — ABNORMAL HIGH (ref 0.3–1.2)
Total Protein: 6 g/dL — ABNORMAL LOW (ref 6.5–8.1)

## 2022-06-14 LAB — CBC
HCT: 37 % (ref 36.0–46.0)
Hemoglobin: 12.8 g/dL (ref 12.0–15.0)
MCH: 35 pg — ABNORMAL HIGH (ref 26.0–34.0)
MCHC: 34.6 g/dL (ref 30.0–36.0)
MCV: 101.1 fL — ABNORMAL HIGH (ref 80.0–100.0)
Platelets: 139 10*3/uL — ABNORMAL LOW (ref 150–400)
RBC: 3.66 MIL/uL — ABNORMAL LOW (ref 3.87–5.11)
RDW: 14.6 % (ref 11.5–15.5)
WBC: 19.5 10*3/uL — ABNORMAL HIGH (ref 4.0–10.5)
nRBC: 0 % (ref 0.0–0.2)

## 2022-06-14 LAB — MAGNESIUM: Magnesium: 2 mg/dL (ref 1.7–2.4)

## 2022-06-14 MED ORDER — SORBITOL 70 % SOLN
960.0000 mL | TOPICAL_OIL | Freq: Once | ORAL | Status: AC
  Administered 2022-06-14: 960 mL via RECTAL
  Filled 2022-06-14: qty 240

## 2022-06-14 NOTE — Progress Notes (Signed)
Pt has been alert and oriented for this shift. Pt has needed PRN pain medication several times throughout shift due to 8/10 abdominal pain. Pt received a smog enema and has had several BMs since.

## 2022-06-14 NOTE — Progress Notes (Signed)
PROGRESS NOTE    Veronica Calhoun  [id number] DOB: [birth year] DOA: 06/10/2022 PCP: Pcp, No   Brief Narrative:    Veronica Calhoun is a 37 y.o. female with medical history significant of alcohol dependence, tobacco use disorder, hypertension-not on medication, generalized anxiety disorder-not on medication, and more presents the ED with a chief complaint of abdominal pain.  She does have daily alcohol use and was admitted with alcohol induced acute pancreatitis.  She is also noted to have hypertensive crisis and is now requiring Cardizem drip.  She continues to have some ongoing sinus tachycardia.  It appears that she has been noncompliant with her home blood pressure medications.  CT abdomen and pelvis performed 2/7 with findings of severe pancreatitis and emphysematous cystitis.  Started on Rocephin with urine cultures showing E Coli with sensitivities pending.  She still remains tachycardic and D-dimer ordered and pending.  Assessment & Plan:   Principal Problem:   Alcohol induced acute pancreatitis Active Problems:   Anxiety   Alcohol use disorder, severe, dependence (HCC)   Acute pancreatitis   Hypertensive crisis   Transaminitis   Tobacco use disorder   Tachycardia   Hypokalemia  Assessment and Plan:   Alcohol induced acute severe pancreatitis -Appears to be a recurrent problem in the setting of her alcohol use drinks daily -Abdominal pain, transaminitis, elevated lipase -Abd US shows normal size for common bile duct and normal gall bladder -Patient is not on any medications that would be likely to cause pancreatitis -Advised on the importance of EtOH cessation -Continue clear liquids -Aggressive IV fluids to continue -Pain control -Consider repeat CT in a.m. if not improving with possible need for GI evaluation as well -Continue to monitor with a.m. labs   Sinus tachycardia ongoing -Okay for transfer to telemetry today - Related to volume depletion in the  setting of above, continue aggressive IV fluid -Also related to pain and some alcohol withdrawal -Patient also has not been taking her home metoprolol which has been resumed -Continue to monitor closely in the ICU -TSH elevated at 4.561, free T4 within normal limits -Increased metoprolol to 50 mg twice daily 2/9 -Check D-dimer today and consider further evaluation for PE based on the result  AKI-resolving Monitor strict I's and O's Avoid nephrotoxic agents Continue to follow labs and continue aggressive IV fluid  Emphysematous cystitis with E. coli UTI -Treat empirically with Rocephin and follow sensitivities -Monitor CBC -Blood cultures also ordered   Tobacco use disorder - Smokes 1 pack/day - Nicotine patch ordered - Continue to monitor   Transaminitis-downtrending -related to EtOH and pancreatits -US shows normal CBD  - not likely to have an obstructing stone -If transaminases continue to rise on tomorrow's BMP consider MRCP -Hold hepatotoxic agents when possible -Continue to monitor   Hypertensive crisis-resolved -Noted to previously be on home clonidine, metoprolol, and HCTZ -Resume on metoprolol and clonidine for now and HCTZ later -Increased metoprolol with noted ongoing sinus tachycardia to 50 mg twice daily   Alcohol use disorder, severe, dependence ([place]) -CIWA protocol -Advised on importance of cessation   Anxiety -Ativan given in the ED -Continue ativan per CIWA protocol -Continue to monitor  Obesity BMI 42.02    DVT prophylaxis:Heparin Code Status: Full Family Communication: None at bedside Disposition Plan:  Status is: Inpatient Remains inpatient appropriate because: Need for IV fluid   Consultants:  None  Procedures:  None  Antimicrobials:  Anti-infectives (From admission, onward)    Start     Dose/Rate  Route Frequency Ordered Stop   06/11/22 2145  cefTRIAXone (ROCEPHIN) 1 g in sodium chloride 0.9 % 100 mL IVPB        1 g 200 mL/hr  over 30 Minutes Intravenous Every 24 hours 06/11/22 2053         Subjective: Patient seen and evaluated today with no new acute complaints or concerns. No acute concerns or events noted overnight.  Appears to be tolerating clear liquid diet.  Continues to remain tachycardic, but blood pressures are improved.  Appears to have some ongoing abdominal pain that is slowly improving.  She still has not had a bowel movement and would like to try an enema.  Objective: Vitals:   06/14/22 0031 06/14/22 0106 06/14/22 0532 06/14/22 0750  BP:  110/71 (!) 142/95 (!) 151/92  Pulse: (!) 113 (!) 109 (!) 110 (!) 118  Resp:  (!) 30 (!) 30 (!) 21  Temp:  99 F (37.2 C) 98.1 F (36.7 C)   TempSrc:  Oral    SpO2:  95% 96% 96%  Weight:      Height:        Intake/Output Summary (Last 24 hours) at 06/14/2022 1057 Last data filed at 06/14/2022 0900 Gross per 24 hour  Intake 3701.24 ml  Output --  Net 3701.24 ml   Filed Weights   06/10/22 2119 06/11/22 0500 06/13/22 2207  Weight: 118.1 kg 118.1 kg 130.9 kg    Examination:  General exam: Appears calm and comfortable, obese Respiratory system: Clear to auscultation. Respiratory effort normal. Cardiovascular system: S1 & S2 heard, RRR.  Tachycardic Gastrointestinal system: Abdomen is soft Central nervous system: Alert and awake Extremities: No edema Skin: No significant lesions noted Psychiatry: Flat affect.    Data Reviewed: I have personally reviewed following labs and imaging studies  CBC: Recent Labs  Lab 06/10/22 1341 06/11/22 0350 06/12/22 0446 06/13/22 0524 06/14/22 0538  WBC 8.9 15.7* 22.1* 19.8* 19.5*  NEUTROABS  --  12.9*  --   --   --   HGB 16.1* 18.0* 15.5* 14.4 12.8  HCT 45.2 52.0* 45.6 41.8 37.0  MCV 97.4 100.2* 103.9* 102.0* 101.1*  PLT 263 208 112* 102* [id number]*   Basic Metabolic Panel: Recent Labs  Lab 06/10/22 1341 06/11/22 0350 06/12/22 0446 06/13/22 0524 06/14/22 0538  NA 133* 132* 133* 131* 132*  K 3.3* 4.5  4.5 4.4 4.0  CL 100 101 105 108 105  CO2 19* 18* 20* 16* 18*  GLUCOSE 209* 221* 154* 82 68*  BUN <5* 8 18 27* 16  CREATININE 0.54 0.85 1.06* 0.94 0.70  CALCIUM 8.4* 8.2* 6.8* 6.7* 7.3*  MG  --  1.6* 1.9 1.8 2.0   GFR: Estimated Creatinine Clearance: 137.5 mL/min (by C-G formula based on SCr of 0.7 mg/dL). Liver Function Tests: Recent Labs  Lab 06/10/22 1341 06/11/22 0350 06/12/22 0446 06/13/22 0524 06/14/22 0538  AST 308* 142* 65* 59* 56*  ALT 115* 83* 45* 36 32  ALKPHOS 169* 133* 100 95 94  BILITOT 1.2 1.2 2.6* 4.5* 7.6*  PROT 8.2* 7.5 5.9* 5.9* 6.0*  ALBUMIN 3.5 3.1* 2.3* 2.2* 2.0*   Recent Labs  Lab 06/10/22 1341 06/10/22 2055  LIPASE 965* 637*   No results for input(s): "AMMONIA" in the last 168 hours. Coagulation Profile: No results for input(s): "INR", "PROTIME" in the last 168 hours. Cardiac Enzymes: No results for input(s): "CKTOTAL", "CKMB", "CKMBINDEX", "TROPONINI" in the last 168 hours. BNP (last 3 results) No results for input(s): "PROBNP" in  the last 8760 hours. HbA1C: No results for input(s): "HGBA1C" in the last 72 hours. CBG: No results for input(s): "GLUCAP" in the last 168 hours. Lipid Profile: No results for input(s): "CHOL", "HDL", "LDLCALC", "TRIG", "CHOLHDL", "LDLDIRECT" in the last 72 hours. Thyroid Function Tests: Recent Labs    06/12/22 1322 06/13/22 0750  TSH 4.561*  --   FREET4  --  0.98   Anemia Panel: No results for input(s): "VITAMINB12", "FOLATE", "FERRITIN", "TIBC", "IRON", "RETICCTPCT" in the last 72 hours. Sepsis Labs: No results for input(s): "PROCALCITON", "LATICACIDVEN" in the last 168 hours.  Recent Results (from the past 240 hour(s))  MRSA Next Gen by PCR, Nasal     Status: None   Collection Time: 06/10/22  8:29 PM   Specimen: Nasal Mucosa; Nasal Swab  Result Value Ref Range Status   MRSA by PCR Next Gen NOT DETECTED NOT DETECTED Final    Comment: (NOTE) The GeneXpert MRSA Assay (FDA approved for NASAL specimens  only), is one component of a comprehensive MRSA colonization surveillance program. It is not intended to diagnose MRSA infection nor to guide or monitor treatment for MRSA infections. Test performance is not FDA approved in patients less than 27 years old. Performed at [facility], [address], [place], [place] [address]   Urine Culture (for pregnant, neutropenic or urologic patients or patients with an indwelling urinary catheter)     Status: Abnormal   Collection Time: 06/11/22  9:40 PM   Specimen: Urine, Clean Catch  Result Value Ref Range Status   Specimen Description URINE, CLEAN CATCH  Final   Special Requests NONE  Final   Culture >=100,000 COLONIES/mL ESCHERICHIA COLI (A)  Final   Report Status 06/14/2022 FINAL  Final   Organism ID, Bacteria ESCHERICHIA COLI (A)  Final      Susceptibility   Escherichia coli - MIC*    AMPICILLIN >=32 RESISTANT Resistant     CEFEPIME <=0.12 SENSITIVE Sensitive     CEFTRIAXONE <=0.25 SENSITIVE Sensitive     CIPROFLOXACIN >=4 RESISTANT Resistant     GENTAMICIN <=1 SENSITIVE Sensitive     IMIPENEM <=0.25 SENSITIVE Sensitive     NITROFURANTOIN <=16 SENSITIVE Sensitive     TRIMETH/SULFA >=320 RESISTANT Resistant     AMPICILLIN/SULBACTAM 4 SENSITIVE Sensitive     PIP/TAZO Value in next row Sensitive      <=4 SENSITIVEPerformed at [facility] [address], [place], [place] [address]    * >=100,000 COLONIES/mL ESCHERICHIA COLI  Culture, blood (Routine X 2) w Reflex to ID Panel     Status: None (Preliminary result)   Collection Time: 06/12/22  8:28 AM   Specimen: BLOOD  Result Value Ref Range Status   Specimen Description BLOOD RIGHT HAND  Final   Special Requests   Final    BOTTLES DRAWN AEROBIC AND ANAEROBIC Blood Culture results may not be optimal due to an inadequate volume of blood received in culture bottles   Culture   Final    NO GROWTH 2 DAYS Performed at [facility], [address] [place],  [address]    Report  Status PENDING  Incomplete         Radiology Studies: No results found.      Scheduled Meds:  Chlorhexidine Gluconate Cloth  6 each Topical Q0600   cloNIDine  0.2 mg Oral BID   folic acid  1 mg Oral Daily   heparin  5,000 Units Subcutaneous Q8H   LORazepam  0-4 mg Intravenous  Q12H   metoprolol tartrate  50 mg Oral BID   multivitamin with minerals  1 tablet Oral Daily   nicotine  21 mg Transdermal Daily   pantoprazole (PROTONIX) IV  40 mg Intravenous Q12H   polyethylene glycol  17 g Oral Daily   thiamine  100 mg Oral Daily   Or   thiamine  100 mg Intravenous Daily   Continuous Infusions:  sodium chloride 150 mL/hr at 06/14/22 0643   cefTRIAXone (ROCEPHIN)  IV 200 mL/hr at 06/13/22 2133   niCARDipine Stopped (06/12/22 1107)     LOS: 3 days    Time spent: 35 minutes    Nicolai Labonte Darleen Crocker, DO [facility]  If 7PM-7AM, please contact night-coverage [email or web address] 06/14/2022, 10:57 AM

## 2022-06-15 ENCOUNTER — Inpatient Hospital Stay (HOSPITAL_COMMUNITY): Payer: Medicaid Other

## 2022-06-15 DIAGNOSIS — K852 Alcohol induced acute pancreatitis without necrosis or infection: Secondary | ICD-10-CM | POA: Diagnosis not present

## 2022-06-15 DIAGNOSIS — K860 Alcohol-induced chronic pancreatitis: Secondary | ICD-10-CM | POA: Diagnosis not present

## 2022-06-15 LAB — COMPREHENSIVE METABOLIC PANEL
ALT: 33 U/L (ref 0–44)
AST: 71 U/L — ABNORMAL HIGH (ref 15–41)
Albumin: 1.9 g/dL — ABNORMAL LOW (ref 3.5–5.0)
Alkaline Phosphatase: 104 U/L (ref 38–126)
Anion gap: 8 (ref 5–15)
BUN: 13 mg/dL (ref 6–20)
CO2: 17 mmol/L — ABNORMAL LOW (ref 22–32)
Calcium: 7.7 mg/dL — ABNORMAL LOW (ref 8.9–10.3)
Chloride: 108 mmol/L (ref 98–111)
Creatinine, Ser: 0.67 mg/dL (ref 0.44–1.00)
GFR, Estimated: >60 mL/min (ref >60)
Glucose, Bld: 79 mg/dL (ref 70–99)
Potassium: 3.8 mmol/L (ref 3.5–5.1)
Sodium: 133 mmol/L — ABNORMAL LOW (ref 135–145)
Total Bilirubin: 9.3 mg/dL — ABNORMAL HIGH (ref 0.3–1.2)
Total Protein: 6 g/dL — ABNORMAL LOW (ref 6.5–8.1)

## 2022-06-15 LAB — CBC
HCT: 36.3 % (ref 36.0–46.0)
Hemoglobin: 12.6 g/dL (ref 12.0–15.0)
MCH: 34.7 pg — ABNORMAL HIGH (ref 26.0–34.0)
MCHC: 34.7 g/dL (ref 30.0–36.0)
MCV: 100 fL (ref 80.0–100.0)
Platelets: 159 10*3/uL (ref 150–400)
RBC: 3.63 MIL/uL — ABNORMAL LOW (ref 3.87–5.11)
RDW: 14.7 % (ref 11.5–15.5)
WBC: 21.2 10*3/uL — ABNORMAL HIGH (ref 4.0–10.5)
nRBC: 0.1 % (ref 0.0–0.2)

## 2022-06-15 LAB — MAGNESIUM: Magnesium: 2.3 mg/dL (ref 1.7–2.4)

## 2022-06-15 MED ORDER — IOHEXOL 9 MG/ML PO SOLN
500.0000 mL | ORAL | Status: AC
  Administered 2022-06-15 (×2): 500 mL via ORAL

## 2022-06-15 MED ORDER — HYDROMORPHONE 1 MG/ML IV SOLN
INTRAVENOUS | Status: DC
  Administered 2022-06-15: 30 mg via INTRAVENOUS
  Administered 2022-06-16: 3.3 mg via INTRAVENOUS
  Administered 2022-06-17: 19.5 mg via INTRAVENOUS
  Administered 2022-06-17: 1 mg via INTRAVENOUS
  Administered 2022-06-17: 3 mg via INTRAVENOUS
  Administered 2022-06-17: 30 mg via INTRAVENOUS
  Administered 2022-06-17: 1.2 mg via INTRAVENOUS
  Administered 2022-06-18 (×2): 1 mg via INTRAVENOUS
  Filled 2022-06-15 (×2): qty 30

## 2022-06-15 MED ORDER — DIPHENHYDRAMINE HCL 12.5 MG/5ML PO ELIX: 12.5000 mg | ORAL_SOLUTION | Freq: Four times a day (QID) | ORAL | Status: DC | PRN

## 2022-06-15 MED ORDER — IOHEXOL 350 MG/ML SOLN
100.0000 mL | Freq: Once | INTRAVENOUS | Status: AC | PRN
  Administered 2022-06-15: 100 mL via INTRAVENOUS

## 2022-06-15 MED ORDER — DIPHENHYDRAMINE HCL 50 MG/ML IJ SOLN: 12.5000 mg | Freq: Four times a day (QID) | INTRAMUSCULAR | Status: DC | PRN

## 2022-06-15 MED ORDER — NALOXONE HCL 0.4 MG/ML IJ SOLN: 0.4000 mg | INTRAMUSCULAR | Status: DC | PRN

## 2022-06-15 MED ORDER — SODIUM CHLORIDE 0.9 % IV SOLN
2.0000 g | Freq: Three times a day (TID) | INTRAVENOUS | Status: DC
  Administered 2022-06-15 – 2022-06-20 (×15): 2 g via INTRAVENOUS
  Filled 2022-06-15 (×15): qty 12.5

## 2022-06-15 MED ORDER — ONDANSETRON HCL 4 MG/2ML IJ SOLN: 4.0000 mg | Freq: Four times a day (QID) | INTRAMUSCULAR | Status: DC | PRN

## 2022-06-15 MED ORDER — SODIUM CHLORIDE 0.9% FLUSH: 9.0000 mL | INTRAVENOUS | Status: DC | PRN

## 2022-06-15 MED ORDER — GUAIFENESIN-DM 100-10 MG/5ML PO SYRP
15.0000 mL | ORAL_SOLUTION | ORAL | Status: DC | PRN
  Administered 2022-06-15 – 2022-06-22 (×22): 15 mL via ORAL
  Filled 2022-06-15 (×22): qty 15

## 2022-06-15 NOTE — Progress Notes (Signed)
With order to insert rectal tube.  Clarified order with Dr. Manuella Ghazi, insert rectal tube for colonic ileus.  Per MD, wait for GI to see patient before inserting tube, may also do suppository and increase patient mobility.  See orders.  Updated care plan explained to patient.  Patient verbalized understanding.

## 2022-06-15 NOTE — Progress Notes (Signed)
Pharmacy Antibiotic Note  Veronica Calhoun is a 37 y.o. female admitted on 06/10/2022 with pneumonia.  Pharmacy has been consulted for cefepime dosing.  Plan: Cefepime 2000 mg IV every 8 hours. Monitor labs, c/s, and patient improvement.  Height: 5' 6"$  (167.6 cm) Weight: 130.9 kg (288 lb 9.3 oz) IBW/kg (Calculated) : 59.3  Temp (24hrs), Avg:97.6 F (36.4 C), Min:97.4 F (36.3 C), Max:97.7 F (36.5 C)  Recent Labs  Lab 06/11/22 0350 06/12/22 0446 06/13/22 0524 06/14/22 0538 06/15/22 0519  WBC 15.7* 22.1* 19.8* 19.5* 21.2*  CREATININE 0.85 1.06* 0.94 0.70 0.67    Estimated Creatinine Clearance: 137.5 mL/min (by C-G formula based on SCr of 0.67 mg/dL).    Allergies  Allergen Reactions   Lisinopril Swelling    Reports Lip swelling   Amlodipine Other (See Comments)    "like I was going to pass out"    Antimicrobials this admission: Cefepime 2/11 >> CTX 2/7 >> 2/10   Microbiology results: 2/8 BCx: ngtd 2/7 UCx: >100k e. coli  2/6 MRSA PCR: neg  Thank you for allowing pharmacy to be a part of this patient's care.  Ramond Craver 06/15/2022 2:09 PM

## 2022-06-15 NOTE — Progress Notes (Signed)
PROGRESS NOTE    Veronica Calhoun  [id number] DOB: 1985/12/12 DOA: 06/10/2022 PCP: Pcp, No   Brief Narrative:    Veronica Calhoun is a 37 y.o. female with medical history significant of alcohol dependence, tobacco use disorder, hypertension-not on medication, generalized anxiety disorder-not on medication, and more presents the ED with a chief complaint of abdominal pain.  She does have daily alcohol use and was admitted with alcohol induced acute pancreatitis.  She is also noted to have hypertensive crisis and is now requiring Cardizem drip.  She continues to have some ongoing sinus tachycardia.  It appears that she has been noncompliant with her home blood pressure medications.  CT abdomen and pelvis performed 2/7 with findings of severe pancreatitis and emphysematous cystitis.  Started on Rocephin with urine cultures showing E Coli with sensitivities pending.  She still remains tachycardic and D-dimer noted to be elevated.  She underwent further imaging on 2/11 with CT angiogram of the chest with findings of multilobar pneumonia and no PE.  CT of the abdomen demonstrating ongoing moderate to severe pancreatitis with no other acute complications and some possible colonic ileus.  Assessment & Plan:   Principal Problem:   Alcohol induced acute pancreatitis Active Problems:   Anxiety   Alcohol use disorder, severe, dependence (HCC)   Acute pancreatitis   Hypertensive crisis   Transaminitis   Tobacco use disorder   Tachycardia   Hypokalemia  Assessment and Plan:   Alcohol induced acute severe pancreatitis -Appears to be a recurrent problem in the setting of her alcohol use drinks daily -Abdominal pain, transaminitis, elevated lipase -Abd US shows normal size for common bile duct and normal gall bladder -Patient is not on any medications that would be likely to cause pancreatitis -Advised on the importance of EtOH cessation -Continue clear liquids -Aggressive IV fluids to  continue -Pain control -Repeat CT 2/11 with moderate to severe pancreatitis with ongoing symptoms, appreciate GI evaluation -Continue to monitor with a.m. labs   Sinus tachycardia ongoing -Okay for transfer to telemetry today - Related to volume depletion in the setting of above, continue aggressive IV fluid -Also related to pain and some alcohol withdrawal -Patient also has not been taking her home metoprolol which has been resumed -Continue to monitor closely in the ICU -TSH elevated at 4.561, free T4 within normal limits -Increased metoprolol to 50 mg twice daily 2/9 -D-dimer elevated and PE study negative for PE  Multilobar pneumonia possibly HCAP -Started on cefepime and discontinue Rocephin  AKI-resolving Monitor strict I's and O's Avoid nephrotoxic agents Continue to follow labs and continue aggressive IV fluid  Emphysematous cystitis with E. coli UTI -Treat empirically with Rocephin and follow sensitivities -Patient was sensitive to Rocephin, but now appears to have HCAP and treatment brought into cefepime due to persistent tachycardia and leukocytosis   Tobacco use disorder - Smokes 1 pack/day - Nicotine patch ordered - Continue to monitor   Transaminitis-downtrending -related to EtOH and pancreatits -US shows normal CBD  - not likely to have an obstructing stone -If transaminases continue to rise on tomorrow's BMP consider MRCP -Hold hepatotoxic agents when possible -Continue to monitor   Hypertensive crisis-resolved -Noted to previously be on home clonidine, metoprolol, and HCTZ -Resume on metoprolol and clonidine for now and HCTZ later -Increased metoprolol with noted ongoing sinus tachycardia to 50 mg twice daily   Alcohol use disorder, severe, dependence ([place]) -CIWA protocol -Advised on importance of cessation   Anxiety -Ativan given in the ED -Continue  ativan per CIWA protocol -Continue to monitor  Obesity BMI 42.02    DVT  prophylaxis:Heparin Code Status: Full Family Communication: None at bedside Disposition Plan:  Status is: Inpatient Remains inpatient appropriate because: Need for IV fluid   Consultants:  GI  Procedures:  See below  Antimicrobials:  Anti-infectives (From admission, onward)    Start     Dose/Rate Route Frequency Ordered Stop   06/11/22 2145  cefTRIAXone (ROCEPHIN) 1 g in sodium chloride 0.9 % 100 mL IVPB        1 g 200 mL/hr over 30 Minutes Intravenous Every 24 hours 06/11/22 2053         Subjective: Patient seen and evaluated today with worsening abdominal pain noted as well as some distention.  She does appear to be having some bowel movements.  Continues to have ongoing tachycardia and leukocytosis.  Objective: Vitals:   06/14/22 0031 06/14/22 0106 06/14/22 0532 06/14/22 0750  BP:  110/71 (!) 142/95 (!) 151/92  Pulse: (!) 113 (!) 109 (!) 110 (!) 118  Resp:  (!) 30 (!) 30 (!) 21  Temp:  99 F (37.2 C) 98.1 F (36.7 C)   TempSrc:  Oral    SpO2:  95% 96% 96%  Weight:      Height:        Intake/Output Summary (Last 24 hours) at 06/14/2022 1057 Last data filed at 06/14/2022 0900 Gross per 24 hour  Intake 3701.24 ml  Output --  Net 3701.24 ml   Filed Weights   06/10/22 2119 06/11/22 0500 06/13/22 2207  Weight: 118.1 kg 118.1 kg 130.9 kg    Examination:  General exam: Appears calm and comfortable, obese Respiratory system: Clear to auscultation. Respiratory effort normal. Cardiovascular system: S1 & S2 heard, RRR.  Tachycardic Gastrointestinal system: Abdomen is tender to palpation and distended Central nervous system: Alert and awake Extremities: No edema Skin: No significant lesions noted Psychiatry: Flat affect.    Data Reviewed: I have personally reviewed following labs and imaging studies  CBC: Recent Labs  Lab 06/10/22 1341 06/11/22 0350 06/12/22 0446 06/13/22 0524 06/14/22 0538  WBC 8.9 15.7* 22.1* 19.8* 19.5*  NEUTROABS  --  12.9*  --    --   --   HGB 16.1* 18.0* 15.5* 14.4 12.8  HCT 45.2 52.0* 45.6 41.8 37.0  MCV 97.4 100.2* 103.9* 102.0* 101.1*  PLT 263 208 112* 102* [id number]*   Basic Metabolic Panel: Recent Labs  Lab 06/10/22 1341 06/11/22 0350 06/12/22 0446 06/13/22 0524 06/14/22 0538  NA 133* 132* 133* 131* 132*  K 3.3* 4.5 4.5 4.4 4.0  CL 100 101 105 108 105  CO2 19* 18* 20* 16* 18*  GLUCOSE 209* 221* 154* 82 68*  BUN <5* 8 18 27* 16  CREATININE 0.54 0.85 1.06* 0.94 0.70  CALCIUM 8.4* 8.2* 6.8* 6.7* 7.3*  MG  --  1.6* 1.9 1.8 2.0   GFR: Estimated Creatinine Clearance: 137.5 mL/min (by C-G formula based on SCr of 0.7 mg/dL). Liver Function Tests: Recent Labs  Lab 06/10/22 1341 06/11/22 0350 06/12/22 0446 06/13/22 0524 06/14/22 0538  AST 308* 142* 65* 59* 56*  ALT 115* 83* 45* 36 32  ALKPHOS 169* 133* 100 95 94  BILITOT 1.2 1.2 2.6* 4.5* 7.6*  PROT 8.2* 7.5 5.9* 5.9* 6.0*  ALBUMIN 3.5 3.1* 2.3* 2.2* 2.0*   Recent Labs  Lab 06/10/22 1341 06/10/22 2055  LIPASE 965* 637*   No results for input(s): "AMMONIA" in the last 168 hours. Coagulation Profile:  No results for input(s): "INR", "PROTIME" in the last 168 hours. Cardiac Enzymes: No results for input(s): "CKTOTAL", "CKMB", "CKMBINDEX", "TROPONINI" in the last 168 hours. BNP (last 3 results) No results for input(s): "PROBNP" in the last 8760 hours. HbA1C: No results for input(s): "HGBA1C" in the last 72 hours. CBG: No results for input(s): "GLUCAP" in the last 168 hours. Lipid Profile: No results for input(s): "CHOL", "HDL", "LDLCALC", "TRIG", "CHOLHDL", "LDLDIRECT" in the last 72 hours. Thyroid Function Tests: Recent Labs    06/12/22 1322 06/13/22 0750  TSH 4.561*  --   FREET4  --  0.98   Anemia Panel: No results for input(s): "VITAMINB12", "FOLATE", "FERRITIN", "TIBC", "IRON", "RETICCTPCT" in the last 72 hours. Sepsis Labs: No results for input(s): "PROCALCITON", "LATICACIDVEN" in the last 168 hours.  Recent Results (from the  past 240 hour(s))  MRSA Next Gen by PCR, Nasal     Status: None   Collection Time: 06/10/22  8:29 PM   Specimen: Nasal Mucosa; Nasal Swab  Result Value Ref Range Status   MRSA by PCR Next Gen NOT DETECTED NOT DETECTED Final    Comment: (NOTE) The GeneXpert MRSA Assay (FDA approved for NASAL specimens only), is one component of a comprehensive MRSA colonization surveillance program. It is not intended to diagnose MRSA infection nor to guide or monitor treatment for MRSA infections. Test performance is not FDA approved in patients less than 61 years old. Performed at [facility], [address] [place], [place] [address]   Urine Culture (for pregnant, neutropenic or urologic patients or patients with an indwelling urinary catheter)     Status: Abnormal   Collection Time: 06/11/22  9:40 PM   Specimen: Urine, Clean Catch  Result Value Ref Range Status   Specimen Description URINE, CLEAN CATCH  Final   Special Requests NONE  Final   Culture >=100,000 COLONIES/mL ESCHERICHIA COLI (A)  Final   Report Status 06/14/2022 FINAL  Final   Organism ID, Bacteria ESCHERICHIA COLI (A)  Final      Susceptibility   Escherichia coli - MIC*    AMPICILLIN >=32 RESISTANT Resistant     CEFEPIME <=0.12 SENSITIVE Sensitive     CEFTRIAXONE <=0.25 SENSITIVE Sensitive     CIPROFLOXACIN >=4 RESISTANT Resistant     GENTAMICIN <=1 SENSITIVE Sensitive     IMIPENEM <=0.25 SENSITIVE Sensitive     NITROFURANTOIN <=16 SENSITIVE Sensitive     TRIMETH/SULFA >=320 RESISTANT Resistant     AMPICILLIN/SULBACTAM 4 SENSITIVE Sensitive     PIP/TAZO Value in next row Sensitive      <=4 SENSITIVEPerformed at [facility] [address] [place], [place] [address]    * >=100,000 COLONIES/mL ESCHERICHIA COLI  Culture, blood (Routine X 2) w Reflex to ID Panel     Status: None (Preliminary result)   Collection Time: 06/12/22  8:28 AM   Specimen: BLOOD  Result Value Ref Range Status   Specimen Description BLOOD RIGHT  HAND  Final   Special Requests   Final    BOTTLES DRAWN AEROBIC AND ANAEROBIC Blood Culture results may not be optimal due to an inadequate volume of blood received in culture bottles   Culture   Final    NO GROWTH 2 DAYS Performed at [facility], [address], [place], [place] [address]    Report Status PENDING  Incomplete         Radiology Studies: No results found.      Scheduled Meds:  Chlorhexidine Gluconate Cloth  6 each Topical Q0600   cloNIDine  0.2 mg Oral BID   folic acid  1 mg Oral Daily   heparin  5,000 Units Subcutaneous Q8H   LORazepam  0-4 mg Intravenous Q12H   metoprolol tartrate  50 mg Oral BID   multivitamin with minerals  1 tablet Oral Daily   nicotine  21 mg Transdermal Daily   pantoprazole (PROTONIX) IV  40 mg Intravenous Q12H   polyethylene glycol  17 g Oral Daily   thiamine  100 mg Oral Daily   Or   thiamine  100 mg Intravenous Daily   Continuous Infusions:  sodium chloride 150 mL/hr at 06/14/22 0643   cefTRIAXone (ROCEPHIN)  IV 200 mL/hr at 06/13/22 2133   niCARDipine Stopped (06/12/22 1107)     LOS: 3 days    Time spent: 35 minutes    Jackson Fetters Darleen Crocker, DO [facility]  If 7PM-7AM, please contact night-coverage [email or web address] 06/14/2022, 10:57 AM

## 2022-06-15 NOTE — Consult Note (Signed)
Referring Provider: No ref. provider found Primary Care Physician:  Pcp, No Primary Gastroenterologist:  Dr.Ahnaf Caponi  Reason for Consultation: Pancreatitis; severe abdominal pain; persisting tachycardia  HPI: Obese 37 year old lady with alcohol use disorder/ongoing relatively heavy alcohol ingestion developed acute onset abdominal pain February 6.  Came to the ED where she was admitted.  Initial lipase elevated.   Total bilirubin (1.2) 5 days ago; today 9.3 without fractionation; AST today 71/ALT 33/alkaline phosphatase 104.  Ultrasound demonstrated fatty liver, in situ gallbladder without stones or biliary dilation.  CT scan on February 7 demonstrated acute interstitial pancreatitis without evidence of necrosis or pseudocyst.  Incidental finding of air in the posterior bladder wall.  Urine culture positive for E. coli. Patient describes severe ongoing periumbilical pain since admission today;  describes it at 9 out of 10 in spite of receiving alternating morphine and Dilaudid.  She has a persisting tachycardia in the 120 range.  Patient has not been febrile.  Repeat abdominal CT scan today reveals persisting acute interstitial pancreatitis without evidence of pseudocyst or necrosis.  She does appear to have new airspace disease in both lungs suspicious for pneumonia.  Again, air seen in the posterior urinary bladder wall.  No biliary dilation.  Fatty appearing liver.  No evidence of portal hypertension. She was initially started on Rocephin that has been transitioned to cefepime.  Initially constipated-received a smog enema;  had multiple bowel movements.  No out and out diarrhea.  No melena or rectal bleeding. Of note, she was admitted to [facility] last year with pancreatitis  - felt to be related to alcohol use.  Lipid panel at that time revealed no evidence of hypertriglyceridemia.    Past Medical History:  Diagnosis Date   Anxiety 2013   Generalized anxiety disorder 04/01/2013    Hypertension 2014    Past Surgical History:  Procedure Laterality Date   CESAREAN SECTION  03/18/2007    Prior to Admission medications   Medication Sig Start Date End Date Taking? Authorizing Provider  Aspirin-Salicylamide-Caffeine (BC HEADACHE POWDER PO) Take 1 packet by mouth every 6 (six) hours as needed (pain/headache).   Yes [provider]    Current Facility-Administered Medications  Medication Dose Route Frequency Provider Last Rate Last Admin   0.9 %  sodium chloride infusion   Intravenous Continuous Heath Lark D, DO 75 mL/hr at 06/15/22 1418 Rate Change at 06/15/22 1418   ceFEPIme (MAXIPIME) 2 g in sodium chloride 0.9 % 100 mL IVPB  2 g Intravenous Q8H Shah, Pratik D, DO 200 mL/hr at 06/15/22 1421 2 g at 06/15/22 1421   Chlorhexidine Gluconate Cloth 2 % PADS 6 each  6 each Topical Q0600 Zierle-Ghosh, Asia B, DO   6 each at 06/15/22 0537   cloNIDine (CATAPRES) tablet 0.2 mg  0.2 mg Oral BID Manuella Ghazi, Pratik D, DO   0.2 mg at 06/15/22 [id number]   diphenhydrAMINE (BENADRYL) injection 12.5 mg  12.5 mg Intravenous Q6H PRN Manuella Ghazi, Pratik D, DO       Or   diphenhydrAMINE (BENADRYL) 12.5 MG/5ML elixir 12.5 mg  12.5 mg Oral Q6H PRN Manuella Ghazi, Pratik D, DO       folic acid (FOLVITE) tablet 1 mg  1 mg Oral Daily Zierle-Ghosh, Asia B, DO   1 mg at 06/15/22 0832   guaiFENesin-dextromethorphan (ROBITUSSIN DM) 100-10 MG/5ML syrup 15 mL  15 mL Oral Q4H PRN Manuella Ghazi, Pratik D, DO   15 mL at 06/15/22 1159   heparin injection 5,000 Units  5,000 Units Subcutaneous Q8H Zierle-Ghosh, Asia B, DO   5,000 Units at 06/15/22 1416   HYDROmorphone (DILAUDID) 1 mg/mL PCA injection   Intravenous Q4H Shah, Pratik D, DO       ibuprofen (ADVIL) tablet 400 mg  400 mg Oral Q6H PRN Zierle-Ghosh, Asia B, DO   400 mg at 06/14/22 1158   metoprolol tartrate (LOPRESSOR) tablet 50 mg  50 mg Oral BID Manuella Ghazi, Pratik D, DO   50 mg at 06/15/22 1056   morphine (PF) 4 MG/ML injection 4 mg  4 mg Intravenous Q2H PRN Zierle-Ghosh, Asia B,  DO   4 mg at 06/15/22 1418   multivitamin with minerals tablet 1 tablet  1 tablet Oral Daily Zierle-Ghosh, Asia B, DO   1 tablet at 06/15/22 0833   naloxone (NARCAN) injection 0.4 mg  0.4 mg Intravenous PRN Manuella Ghazi, Pratik D, DO       And   sodium chloride flush (NS) 0.9 % injection 9 mL  9 mL Intravenous PRN Manuella Ghazi, Pratik D, DO       nicotine (NICODERM CQ - dosed in mg/24 hours) patch 21 mg  21 mg Transdermal Daily Zierle-Ghosh, Asia B, DO   21 mg at 06/15/22 0833   ondansetron (ZOFRAN) tablet 4 mg  4 mg Oral Q6H PRN Zierle-Ghosh, Asia B, DO       Or   ondansetron (ZOFRAN) injection 4 mg  4 mg Intravenous Q6H PRN Zierle-Ghosh, Asia B, DO   4 mg at 06/11/22 0807   ondansetron (ZOFRAN) injection 4 mg  4 mg Intravenous Q6H PRN Manuella Ghazi, Pratik D, DO       oxyCODONE (Oxy IR/ROXICODONE) immediate release tablet 5 mg  5 mg Oral Q4H PRN Zierle-Ghosh, Asia B, DO   5 mg at 06/14/22 0752   pantoprazole (PROTONIX) injection 40 mg  40 mg Intravenous Q12H Zierle-Ghosh, Asia B, DO   40 mg at 06/15/22 0828   polyethylene glycol (MIRALAX / GLYCOLAX) packet 17 g  17 g Oral Daily Manuella Ghazi, Pratik D, DO   17 g at 06/14/22 0801   thiamine (VITAMIN B1) tablet 100 mg  100 mg Oral Daily Zierle-Ghosh, Asia B, DO   100 mg at 06/15/22 [id number]   Or   thiamine (VITAMIN B1) injection 100 mg  100 mg Intravenous Daily Zierle-Ghosh, Asia B, DO   100 mg at 06/11/22 0956    Allergies as of 06/10/2022 - Review Complete 06/10/2022  Allergen Reaction Noted   Lisinopril Swelling 12/16/2018   Amlodipine Other (See Comments) 10/11/2018    Family History  Problem Relation Age of Onset   Heart murmur Father    AAA (abdominal aortic aneurysm) Sister    Urinary tract infection Daughter    Hypertension Maternal Grandmother     Social History   Socioeconomic History   Marital status: Single    Spouse name: Not on file   Number of children: 1   Years of education: Not on file   Highest education level: Some college, no degree   Occupational History   Not on file  Tobacco Use   Smoking status: Every Day    Packs/day: 1.00    Years: 10.00    Total pack years: 10.00    Types: Cigarettes   Smokeless tobacco: Never  Vaping Use   Vaping Use: Never used  Substance and Sexual Activity   Alcohol use: Yes    Alcohol/week: 10.0 standard drinks of alcohol    Types: 10 Cans of beer per week  Comment: 4-5 beers daily   Drug use: No   Sexual activity: Yes    Partners: Male    Birth control/protection: None  Other Topics Concern   Not on file  Social History Narrative   Lives in [place] with daughter   Has long term boyfriend.   Social Determinants of Health   Financial Resource Strain: Not on file  Food Insecurity: No Food Insecurity (06/10/2022)   Hunger Vital Sign    Worried About Running Out of Food in the Last Year: Never true    Ran Out of Food in the Last Year: Never true  Transportation Needs: No Transportation Needs (06/10/2022)   PRAPARE - Hydrologist (Medical): No    Lack of Transportation (Non-Medical): No  Physical Activity: Inactive (09/02/2017)   Exercise Vital Sign    Days of Exercise per Week: 0 days    Minutes of Exercise per Session: 0 min  Stress: Stress Concern Present (09/02/2017)   [place]    Feeling of Stress : To some extent  Social Connections: Not on file  Intimate Partner Violence: Not At Risk (06/10/2022)   Humiliation, Afraid, Rape, and Kick questionnaire    Fear of Current or Ex-Partner: No    Emotionally Abused: No    Physically Abused: No    Sexually Abused: No    Review of Systems: As in history of present illness  Physical Exam: Vital signs in last 24 hours: Temp:  [97.4 F (36.3 C)-97.7 F (36.5 C)] 97.6 F (36.4 C) (02/11 1144) Pulse Rate:  [100-110] 103 (02/11 1144) Resp:  [18-20] 20 (02/11 1144) BP: (117-155)/(75-98) 125/83 (02/11 1144) SpO2:  [96 %-97 %] 96 %  (02/11 1144) Last BM Date : 06/10/22 General:   Alert,   well-nourished, pleasant and cooperative; appears to be in mild distress. Head:  Normocephalic and atraumatic. Abdomen: Obese.  Bowel sounds decreased.  She has significant periumbilical tenderness   Intake/Output from previous day: 02/10 0701 - 02/11 0700 In: 650 [P.O.:500; I.V.:150] Out: -  Intake/Output this shift: Total I/O In: 1842.5 [P.O.:1120; I.V.:622.5; IV Piggyback:100] Out: -   Lab Results: Recent Labs    06/13/22 0524 06/14/22 0538 06/15/22 0519  WBC 19.8* 19.5* 21.2*  HGB 14.4 12.8 12.6  HCT 41.8 37.0 36.3  PLT 102* 139* 159   BMET Recent Labs    06/13/22 0524 06/14/22 0538 06/15/22 0519  NA 131* 132* 133*  K 4.4 4.0 3.8  CL 108 105 108  CO2 16* 18* 17*  GLUCOSE 82 68* 79  BUN 27* 16 13  CREATININE 0.94 0.70 0.67  CALCIUM 6.7* 7.3* 7.7*   LFT Recent Labs    06/15/22 0519  PROT 6.0*  ALBUMIN 1.9*  AST 71*  ALT 33  ALKPHOS 104  BILITOT 9.3*   PT/INR No results for input(s): "LABPROT", "INR" in the last 72 hours. Hepatitis Panel No results for input(s): "HEPBSAG", "HCVAB", "HEPAIGM", "HEPBIGM" in the last 72 hours. C-Diff No results for input(s): "CDIFFTOX" in the last 72 hours.  Studies/Results: CT Angio Chest Pulmonary Embolism (PE) W or WO Contrast  Result Date: 06/15/2022 CLINICAL DATA:  Tachycardia. Elevated D-dimer. Alcohol-induced acute pancreatitis. EXAM: CT ANGIOGRAPHY CHEST CT ABDOMEN AND PELVIS WITH CONTRAST TECHNIQUE: Multidetector CT imaging of the chest was performed using the standard protocol during bolus administration of intravenous contrast. Multiplanar CT image reconstructions and MIPs were obtained to evaluate the vascular anatomy. Multidetector CT imaging of  the abdomen and pelvis was performed using the standard protocol during bolus administration of intravenous contrast. RADIATION DOSE REDUCTION: This exam was performed according to the departmental  dose-optimization program which includes automated exposure control, adjustment of the mA and/or kV according to patient size and/or use of iterative reconstruction technique. CONTRAST:  117m OMNIPAQUE IOHEXOL 350 MG/ML SOLN COMPARISON:  AP CT on 06/11/2022 FINDINGS: CTA CHEST FINDINGS Cardiovascular: Satisfactory opacification of pulmonary arteries noted, and no pulmonary emboli identified. No evidence of thoracic aortic dissection or aneurysm. Mediastinum/Nodes: No masses or pathologically enlarged lymph nodes identified. Lungs/Pleura: Airspace disease is seen in both upper lobes and right middle lobe, suspicious for pneumonia. Left lower lobe atelectasis and tiny left pleural effusion noted. Musculoskeletal: No suspicious bone lesions identified. Review of the MIP images confirms the above findings. CT ABDOMEN and PELVIS FINDINGS Hepatobiliary: No hepatic masses identified. Moderate diffuse hepatic steatosis again noted. Gallbladder is unremarkable. No evidence of biliary ductal dilatation. Pancreas: Moderate to severe acute pancreatitis shows no significant change. No evidence of pancreatic necrosis or mass. Evidence of pancreatic ductal dilatation. Moderate peripancreatic fluid is seen which tracts inferiorly into the pelvis. This shows mild decrease since previous study. No pseudocysts identified. Spleen: Within normal limits in size and appearance. Adrenals/Urinary Tract: No suspicious masses identified. No evidence of ureteral calculi or hydronephrosis. Stomach/Bowel: Reactive wall thickening is again seen involving the stomach secondary to acute pancreatitis. Increased diffuse colonic dilatation is seen, consistent with ileus. Vascular/Lymphatic: No pathologically enlarged lymph nodes. No acute vascular findings. Reproductive:  No mass or other significant abnormality. Other:  Increased diffuse body wall edema. Musculoskeletal:  No suspicious bone lesions identified. Review of the MIP images confirms the  above findings. IMPRESSION: No evidence of pulmonary embolism. Airspace disease in both upper lobes and right middle lobe, suspicious for pneumonia. Left lower lobe atelectasis and tiny left pleural effusion. Moderate to severe acute pancreatitis, with mild decrease in peripancreatic fluid and ascites. No evidence of pancreatic necrosis or pseudocysts. Worsening colonic ileus. Increased diffuse body wall edema. Stable hepatic steatosis. Electronically Signed   By: JMarlaine HindM.D.   On: 06/15/2022 13:19   CT ABDOMEN PELVIS W CONTRAST  Result Date: 06/15/2022 CLINICAL DATA:  Tachycardia. Elevated D-dimer. Alcohol-induced acute pancreatitis. EXAM: CT ANGIOGRAPHY CHEST CT ABDOMEN AND PELVIS WITH CONTRAST TECHNIQUE: Multidetector CT imaging of the chest was performed using the standard protocol during bolus administration of intravenous contrast. Multiplanar CT image reconstructions and MIPs were obtained to evaluate the vascular anatomy. Multidetector CT imaging of the abdomen and pelvis was performed using the standard protocol during bolus administration of intravenous contrast. RADIATION DOSE REDUCTION: This exam was performed according to the departmental dose-optimization program which includes automated exposure control, adjustment of the mA and/or kV according to patient size and/or use of iterative reconstruction technique. CONTRAST:  1065mOMNIPAQUE IOHEXOL 350 MG/ML SOLN COMPARISON:  AP CT on 06/11/2022 FINDINGS: CTA CHEST FINDINGS Cardiovascular: Satisfactory opacification of pulmonary arteries noted, and no pulmonary emboli identified. No evidence of thoracic aortic dissection or aneurysm. Mediastinum/Nodes: No masses or pathologically enlarged lymph nodes identified. Lungs/Pleura: Airspace disease is seen in both upper lobes and right middle lobe, suspicious for pneumonia. Left lower lobe atelectasis and tiny left pleural effusion noted. Musculoskeletal: No suspicious bone lesions identified. Review  of the MIP images confirms the above findings. CT ABDOMEN and PELVIS FINDINGS Hepatobiliary: No hepatic masses identified. Moderate diffuse hepatic steatosis again noted. Gallbladder is unremarkable. No evidence of biliary ductal dilatation. Pancreas: Moderate to severe  acute pancreatitis shows no significant change. No evidence of pancreatic necrosis or mass. Evidence of pancreatic ductal dilatation. Moderate peripancreatic fluid is seen which tracts inferiorly into the pelvis. This shows mild decrease since previous study. No pseudocysts identified. Spleen: Within normal limits in size and appearance. Adrenals/Urinary Tract: No suspicious masses identified. No evidence of ureteral calculi or hydronephrosis. Stomach/Bowel: Reactive wall thickening is again seen involving the stomach secondary to acute pancreatitis. Increased diffuse colonic dilatation is seen, consistent with ileus. Vascular/Lymphatic: No pathologically enlarged lymph nodes. No acute vascular findings. Reproductive:  No mass or other significant abnormality. Other:  Increased diffuse body wall edema. Musculoskeletal:  No suspicious bone lesions identified. Review of the MIP images confirms the above findings. IMPRESSION: No evidence of pulmonary embolism. Airspace disease in both upper lobes and right middle lobe, suspicious for pneumonia. Left lower lobe atelectasis and tiny left pleural effusion. Moderate to severe acute pancreatitis, with mild decrease in peripancreatic fluid and ascites. No evidence of pancreatic necrosis or pseudocysts. Worsening colonic ileus. Increased diffuse body wall edema. Stable hepatic steatosis. Electronically Signed   By: Marlaine Hind M.D.   On: 06/15/2022 13:19    Impression: Obese 37 year old lady with ongoing alcohol abuse admitted with acute interstitial pancreatitis.  It appears that her pancreatitis is, indeed, related to alcohol.  No evidence of stone disease.  No medications to implicate, no family history.   No evidence of hyperlipidemia  She has persisting significant abdominal pain and tachycardia.  I suspect that these symptoms are due to ongoing, uncomplicated interstitial pancreatitis.  E. coli UTI and pneumonia, likely community-acquired, are separate issues.  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  She will likely continue to have significant analgesic needs as long as she has ongoing inflammation of her pancreas as marked by persisting tachycardia.  Recommendations:  NPO.  Consider escalating analgesic regimen as discussed with Dr. Manuella Ghazi.  INR, hepatic function profile tomorrow morning  With patient's permission, I discussed her condition via speaker phone with her mother, Milus Banister, in her room while I was visiting.  Absolute alcohol abstinence recommended moving forward.        Notice:  This dictation was prepared with Dragon dictation along with smaller phrase technology. Any transcriptional errors that result from this process are unintentional and may not be corrected upon review.

## 2022-06-16 DIAGNOSIS — K567 Ileus, unspecified: Secondary | ICD-10-CM

## 2022-06-16 DIAGNOSIS — R1013 Epigastric pain: Secondary | ICD-10-CM

## 2022-06-16 DIAGNOSIS — K7011 Alcoholic hepatitis with ascites: Secondary | ICD-10-CM

## 2022-06-16 DIAGNOSIS — K852 Alcohol induced acute pancreatitis without necrosis or infection: Secondary | ICD-10-CM | POA: Diagnosis not present

## 2022-06-16 LAB — CBC
HCT: 34 % — ABNORMAL LOW (ref 36.0–46.0)
Hemoglobin: 11.7 g/dL — ABNORMAL LOW (ref 12.0–15.0)
MCH: 34.4 pg — ABNORMAL HIGH (ref 26.0–34.0)
MCHC: 34.4 g/dL (ref 30.0–36.0)
MCV: 100 fL (ref 80.0–100.0)
Platelets: 210 10*3/uL (ref 150–400)
RBC: 3.4 MIL/uL — ABNORMAL LOW (ref 3.87–5.11)
RDW: 15.4 % (ref 11.5–15.5)
WBC: 26.7 10*3/uL — ABNORMAL HIGH (ref 4.0–10.5)
nRBC: 0 % (ref 0.0–0.2)

## 2022-06-16 LAB — HEPATITIS PANEL, ACUTE
HCV Ab: NONREACTIVE
Hep A IgM: NONREACTIVE
Hep B C IgM: NONREACTIVE
Hepatitis B Surface Ag: NONREACTIVE

## 2022-06-16 LAB — COMPREHENSIVE METABOLIC PANEL
ALT: 32 U/L (ref 0–44)
AST: 67 U/L — ABNORMAL HIGH (ref 15–41)
Albumin: 1.9 g/dL — ABNORMAL LOW (ref 3.5–5.0)
Alkaline Phosphatase: 113 U/L (ref 38–126)
Anion gap: 8 (ref 5–15)
BUN: 16 mg/dL (ref 6–20)
CO2: 19 mmol/L — ABNORMAL LOW (ref 22–32)
Calcium: 7.9 mg/dL — ABNORMAL LOW (ref 8.9–10.3)
Chloride: 107 mmol/L (ref 98–111)
Creatinine, Ser: 0.88 mg/dL (ref 0.44–1.00)
GFR, Estimated: >60 mL/min (ref >60)
Glucose, Bld: 94 mg/dL (ref 70–99)
Potassium: 3.7 mmol/L (ref 3.5–5.1)
Sodium: 134 mmol/L — ABNORMAL LOW (ref 135–145)
Total Bilirubin: 9.7 mg/dL — ABNORMAL HIGH (ref 0.3–1.2)
Total Protein: 6.1 g/dL — ABNORMAL LOW (ref 6.5–8.1)

## 2022-06-16 LAB — LIPID PANEL
Cholesterol: 142 mg/dL (ref 0–200)
HDL: <10 mg/dL — ABNORMAL LOW (ref >40)
Triglycerides: 362 mg/dL — ABNORMAL HIGH (ref <150)

## 2022-06-16 LAB — PROTIME-INR
INR: 1.4 — ABNORMAL HIGH (ref 0.8–1.2)
Prothrombin Time: 16.5 seconds — ABNORMAL HIGH (ref 11.4–15.2)

## 2022-06-16 MED ORDER — IPRATROPIUM-ALBUTEROL 0.5-2.5 (3) MG/3ML IN SOLN
3.0000 mL | Freq: Four times a day (QID) | RESPIRATORY_TRACT | Status: DC | PRN
  Administered 2022-06-16: 3 mL via RESPIRATORY_TRACT
  Filled 2022-06-16: qty 3

## 2022-06-16 MED ORDER — HYDROMORPHONE HCL 1 MG/ML IJ SOLN: 0.5000 mg | Freq: Once | INTRAMUSCULAR | Status: DC

## 2022-06-16 NOTE — Progress Notes (Signed)
PROGRESS NOTE    Veronica Calhoun  [id number] DOB: 1986-03-07 DOA: 06/10/2022 PCP: Pcp, No   Brief Narrative:    Veronica Calhoun is a 37 y.o. female with medical history significant of alcohol dependence, tobacco use disorder, hypertension-not on medication, generalized anxiety disorder-not on medication, and more presents the ED with a chief complaint of abdominal pain.  She does have daily alcohol use and was admitted with alcohol induced acute pancreatitis.  She is also noted to have hypertensive crisis and is now requiring Cardizem drip.  She continues to have some ongoing sinus tachycardia.  It appears that she has been noncompliant with her home blood pressure medications.  CT abdomen and pelvis performed 2/7 with findings of severe pancreatitis and emphysematous cystitis.  Started on Rocephin with urine cultures showing E Coli with sensitivities pending.  She still remains tachycardic and D-dimer noted to be elevated.  She underwent further imaging on 2/11 with CT angiogram of the chest with findings of multilobar pneumonia and no PE.  CT of the abdomen demonstrating ongoing moderate to severe pancreatitis with no other acute complications.  GI following and assisting with management.  Assessment & Plan:   Principal Problem:   Alcohol induced acute pancreatitis Active Problems:   Anxiety   Alcohol use disorder, severe, dependence (HCC)   Acute pancreatitis   Hypertensive crisis   Transaminitis   Tobacco use disorder   Tachycardia   Hypokalemia  Assessment and Plan:   Alcohol induced acute severe pancreatitis -Appears to be a recurrent problem in the setting of her alcohol use drinks daily -Abdominal pain, transaminitis, elevated lipase -Abd US shows normal size for common bile duct and normal gall bladder -Patient is not on any medications that would be likely to cause pancreatitis -Advised on the importance of EtOH cessation -Continue clear liquids -Aggressive IV  fluids to continue -Pain control -Repeat CT 2/11 with moderate to severe pancreatitis with ongoing symptoms, appreciate GI evaluation -Continue to monitor with a.m. labs   Sinus tachycardia ongoing -Okay for transfer to telemetry today - Related to volume depletion in the setting of above, continue aggressive IV fluid -Also related to pain and some alcohol withdrawal -Patient also has not been taking her home metoprolol which has been resumed -Continue to monitor closely in the ICU -TSH elevated at 4.561, free T4 within normal limits -Increased metoprolol to 50 mg twice daily 2/9 -D-dimer elevated and PE study negative for PE -Improved with better pain control on Dilaudid PCA  Multilobar pneumonia possibly HCAP -Started on cefepime and discontinue Rocephin  AKI-resolving Monitor strict I's and O's Avoid nephrotoxic agents Continue to follow labs and continue aggressive IV fluid  Emphysematous cystitis with E. coli UTI -Treat empirically with Rocephin and follow sensitivities -Patient was sensitive to Rocephin, but now appears to have HCAP and treatment brought into cefepime due to persistent tachycardia and leukocytosis   Tobacco use disorder - Smokes 1 pack/day - Nicotine patch ordered - Continue to monitor   Transaminitis-downtrending -related to EtOH and pancreatits -US shows normal CBD  - not likely to have an obstructing stone -If transaminases continue to rise on tomorrow's BMP consider MRCP -Hold hepatotoxic agents when possible -Continue to monitor   Hypertensive crisis-resolved -Noted to previously be on home clonidine, metoprolol, and HCTZ -Resume on metoprolol and clonidine for now and HCTZ later -Increased metoprolol with noted ongoing sinus tachycardia to 50 mg twice daily   Alcohol use disorder, severe, dependence ([name]) -CIWA protocol -Advised on importance of  cessation   Anxiety -Ativan given in the ED -Continue ativan per CIWA protocol -Continue to  monitor  Obesity BMI 42.02    DVT prophylaxis:Heparin Code Status: Full Family Communication: None at bedside Disposition Plan:  Status is: Inpatient Remains inpatient appropriate because: Need for IV fluid   Consultants:  GI  Procedures:  See below  Antimicrobials:  Anti-infectives (From admission, onward)    Start     Dose/Rate Route Frequency Ordered Stop   06/11/22 2145  cefTRIAXone (ROCEPHIN) 1 g in sodium chloride 0.9 % 100 mL IVPB        1 g 200 mL/hr over 30 Minutes Intravenous Every 24 hours 06/11/22 2053         Subjective: Patient seen and evaluated today with improving abdominal pain today.  She denies any nausea or vomiting.  Objective: Vitals:   06/14/22 0031 06/14/22 0106 06/14/22 0532 06/14/22 0750  BP:  110/71 (!) 142/95 (!) 151/92  Pulse: (!) 113 (!) 109 (!) 110 (!) 118  Resp:  (!) 30 (!) 30 (!) 21  Temp:  99 F (37.2 C) 98.1 F (36.7 C)   TempSrc:  Oral    SpO2:  95% 96% 96%  Weight:      Height:        Intake/Output Summary (Last 24 hours) at 06/14/2022 1057 Last data filed at 06/14/2022 0900 Gross per 24 hour  Intake 3701.24 ml  Output --  Net 3701.24 ml   Filed Weights   06/10/22 2119 06/11/22 0500 06/13/22 2207  Weight: 118.1 kg 118.1 kg 130.9 kg    Examination:  General exam: Appears calm and comfortable, obese Respiratory system: Clear to auscultation. Respiratory effort normal. Cardiovascular system: S1 & S2 heard, RRR.  Tachycardic Gastrointestinal system: Abdomen is tender to palpation and distended Central nervous system: Alert and awake Extremities: No edema Skin: No significant lesions noted Psychiatry: Flat affect.    Data Reviewed: I have personally reviewed following labs and imaging studies  CBC: Recent Labs  Lab 06/10/22 1341 06/11/22 0350 06/12/22 0446 06/13/22 0524 06/14/22 0538  WBC 8.9 15.7* 22.1* 19.8* 19.5*  NEUTROABS  --  12.9*  --   --   --   HGB 16.1* 18.0* 15.5* 14.4 12.8  HCT 45.2 52.0*  45.6 41.8 37.0  MCV 97.4 100.2* 103.9* 102.0* 101.1*  PLT 263 208 112* 102* [id number]*   Basic Metabolic Panel: Recent Labs  Lab 06/10/22 1341 06/11/22 0350 06/12/22 0446 06/13/22 0524 06/14/22 0538  NA 133* 132* 133* 131* 132*  K 3.3* 4.5 4.5 4.4 4.0  CL 100 101 105 108 105  CO2 19* 18* 20* 16* 18*  GLUCOSE 209* 221* 154* 82 68*  BUN <5* 8 18 27* 16  CREATININE 0.54 0.85 1.06* 0.94 0.70  CALCIUM 8.4* 8.2* 6.8* 6.7* 7.3*  MG  --  1.6* 1.9 1.8 2.0   GFR: Estimated Creatinine Clearance: 137.5 mL/min (by C-G formula based on SCr of 0.7 mg/dL). Liver Function Tests: Recent Labs  Lab 06/10/22 1341 06/11/22 0350 06/12/22 0446 06/13/22 0524 06/14/22 0538  AST 308* 142* 65* 59* 56*  ALT 115* 83* 45* 36 32  ALKPHOS 169* 133* 100 95 94  BILITOT 1.2 1.2 2.6* 4.5* 7.6*  PROT 8.2* 7.5 5.9* 5.9* 6.0*  ALBUMIN 3.5 3.1* 2.3* 2.2* 2.0*   Recent Labs  Lab 06/10/22 1341 06/10/22 2055  LIPASE 965* 637*   No results for input(s): "AMMONIA" in the last 168 hours. Coagulation Profile: No results for input(s): "INR", "PROTIME"  in the last 168 hours. Cardiac Enzymes: No results for input(s): "CKTOTAL", "CKMB", "CKMBINDEX", "TROPONINI" in the last 168 hours. BNP (last 3 results) No results for input(s): "PROBNP" in the last 8760 hours. HbA1C: No results for input(s): "HGBA1C" in the last 72 hours. CBG: No results for input(s): "GLUCAP" in the last 168 hours. Lipid Profile: No results for input(s): "CHOL", "HDL", "LDLCALC", "TRIG", "CHOLHDL", "LDLDIRECT" in the last 72 hours. Thyroid Function Tests: Recent Labs    06/12/22 1322 06/13/22 0750  TSH 4.561*  --   FREET4  --  0.98   Anemia Panel: No results for input(s): "VITAMINB12", "FOLATE", "FERRITIN", "TIBC", "IRON", "RETICCTPCT" in the last 72 hours. Sepsis Labs: No results for input(s): "PROCALCITON", "LATICACIDVEN" in the last 168 hours.  Recent Results (from the past 240 hour(s))  MRSA Next Gen by PCR, Nasal     Status:  None   Collection Time: 06/10/22  8:29 PM   Specimen: Nasal Mucosa; Nasal Swab  Result Value Ref Range Status   MRSA by PCR Next Gen NOT DETECTED NOT DETECTED Final    Comment: (NOTE) The GeneXpert MRSA Assay (FDA approved for NASAL specimens only), is one component of a comprehensive MRSA colonization surveillance program. It is not intended to diagnose MRSA infection nor to guide or monitor treatment for MRSA infections. Test performance is not FDA approved in patients less than 22 years old. Performed at [facility], [address], [place], [place] [address]   Urine Culture (for pregnant, neutropenic or urologic patients or patients with an indwelling urinary catheter)     Status: Abnormal   Collection Time: 06/11/22  9:40 PM   Specimen: Urine, Clean Catch  Result Value Ref Range Status   Specimen Description URINE, CLEAN CATCH  Final   Special Requests NONE  Final   Culture >=100,000 COLONIES/mL ESCHERICHIA COLI (A)  Final   Report Status 06/14/2022 FINAL  Final   Organism ID, Bacteria ESCHERICHIA COLI (A)  Final      Susceptibility   Escherichia coli - MIC*    AMPICILLIN >=32 RESISTANT Resistant     CEFEPIME <=0.12 SENSITIVE Sensitive     CEFTRIAXONE <=0.25 SENSITIVE Sensitive     CIPROFLOXACIN >=4 RESISTANT Resistant     GENTAMICIN <=1 SENSITIVE Sensitive     IMIPENEM <=0.25 SENSITIVE Sensitive     NITROFURANTOIN <=16 SENSITIVE Sensitive     TRIMETH/SULFA >=320 RESISTANT Resistant     AMPICILLIN/SULBACTAM 4 SENSITIVE Sensitive     PIP/TAZO Value in next row Sensitive      <=4 SENSITIVEPerformed at [place] [address] [place], [place] [address]    * >=100,000 COLONIES/mL ESCHERICHIA COLI  Culture, blood (Routine X 2) w Reflex to ID Panel     Status: None (Preliminary result)   Collection Time: 06/12/22  8:28 AM   Specimen: BLOOD  Result Value Ref Range Status   Specimen Description BLOOD RIGHT HAND  Final   Special Requests   Final    BOTTLES DRAWN  AEROBIC AND ANAEROBIC Blood Culture results may not be optimal due to an inadequate volume of blood received in culture bottles   Culture   Final    NO GROWTH 2 DAYS Performed at [facility], [address] [place],  [address]    Report Status PENDING  Incomplete         Radiology Studies: No results found.      Scheduled Meds:  Chlorhexidine Gluconate Cloth  6 each Topical Q0600  cloNIDine  0.2 mg Oral BID   folic acid  1 mg Oral Daily   heparin  5,000 Units Subcutaneous Q8H   LORazepam  0-4 mg Intravenous Q12H   metoprolol tartrate  50 mg Oral BID   multivitamin with minerals  1 tablet Oral Daily   nicotine  21 mg Transdermal Daily   pantoprazole (PROTONIX) IV  40 mg Intravenous Q12H   polyethylene glycol  17 g Oral Daily   thiamine  100 mg Oral Daily   Or   thiamine  100 mg Intravenous Daily   Continuous Infusions:  sodium chloride 150 mL/hr at 06/14/22 0643   cefTRIAXone (ROCEPHIN)  IV 200 mL/hr at 06/13/22 2133   niCARDipine Stopped (06/12/22 1107)     LOS: 3 days    Time spent: 35 minutes    Tonianne Fine Darleen Crocker, DO [facility]  If 7PM-7AM, please contact night-coverage [email or web address] 06/14/2022, 10:57 AM

## 2022-06-16 NOTE — Progress Notes (Addendum)
Gastroenterology Progress Note    Primary Gastroenterologist:  Dr. Gala Romney  Patient ID: Veronica Calhoun; [place]:[id number]; [birth year]    Subjective   3 BMs yesterday. +flatus. Feels pain is improved with PCA pump. Abdomen feels distended like yesterday but not worsened.    Objective   Vital signs in last 24 hours Temp:  [97.5 F (36.4 C)-101 F (38.3 C)] 97.7 F (36.5 C) (02/12 0800) Pulse Rate:  [93-123] 99 (02/12 0930) Resp:  [1-21] 15 (02/12 0800) BP: (119-155)/(73-94) 119/75 (02/12 0930) SpO2:  [96 %-100 %] 96 % (02/12 0800) FiO2 (%):  [29 %-32 %] 29 % (02/12 0734) Last BM Date : 06/15/22  Physical Exam General:   Alert and oriented, pleasant Head:  Normocephalic and atraumatic. Abdomen:  Bowel sounds present, distended but soft, TTP epigastric, no rebound or guarding.  Extremities:  Without  edema. Neurologic:  Alert and  oriented x4    Intake/Output from previous day: 02/11 0701 - 02/12 0700 In: 3678.3 [P.O.:1360; I.V.:2018.3; IV Piggyback:300] Out: -  Intake/Output this shift: No intake/output data recorded.  Lab Results  Recent Labs    06/14/22 0538 06/15/22 0519 06/16/22 0849  WBC 19.5* 21.2* 26.7*  HGB 12.8 12.6 11.7*  HCT 37.0 36.3 34.0*  PLT 139* 159 210   BMET Recent Labs    06/14/22 0538 06/15/22 0519 06/16/22 0849  NA 132* 133* 134*  K 4.0 3.8 3.7  CL 105 108 107  CO2 18* 17* 19*  GLUCOSE 68* 79 94  BUN 16 13 16  $ CREATININE 0.70 0.67 0.88  CALCIUM 7.3* 7.7* 7.9*   LFT Recent Labs    06/14/22 0538 06/15/22 0519 06/16/22 0849  PROT 6.0* 6.0* 6.1*  ALBUMIN 2.0* 1.9* 1.9*  AST 56* 71* 67*  ALT 32 33 32  ALKPHOS 94 104 113  BILITOT 7.6* 9.3* 9.7*   PT/INR Recent Labs    06/16/22 0849  LABPROT 16.5*  INR 1.4*     Studies/Results CT Angio Chest Pulmonary Embolism (PE) W or WO Contrast  Result Date: 06/15/2022 CLINICAL DATA:  Tachycardia. Elevated D-dimer. Alcohol-induced acute pancreatitis. EXAM: CT ANGIOGRAPHY CHEST  CT ABDOMEN AND PELVIS WITH CONTRAST TECHNIQUE: Multidetector CT imaging of the chest was performed using the standard protocol during bolus administration of intravenous contrast. Multiplanar CT image reconstructions and MIPs were obtained to evaluate the vascular anatomy. Multidetector CT imaging of the abdomen and pelvis was performed using the standard protocol during bolus administration of intravenous contrast. RADIATION DOSE REDUCTION: This exam was performed according to the departmental dose-optimization program which includes automated exposure control, adjustment of the mA and/or kV according to patient size and/or use of iterative reconstruction technique. CONTRAST:  125m OMNIPAQUE IOHEXOL 350 MG/ML SOLN COMPARISON:  AP CT on 06/11/2022 FINDINGS: CTA CHEST FINDINGS Cardiovascular: Satisfactory opacification of pulmonary arteries noted, and no pulmonary emboli identified. No evidence of thoracic aortic dissection or aneurysm. Mediastinum/Nodes: No masses or pathologically enlarged lymph nodes identified. Lungs/Pleura: Airspace disease is seen in both upper lobes and right middle lobe, suspicious for pneumonia. Left lower lobe atelectasis and tiny left pleural effusion noted. Musculoskeletal: No suspicious bone lesions identified. Review of the MIP images confirms the above findings. CT ABDOMEN and PELVIS FINDINGS Hepatobiliary: No hepatic masses identified. Moderate diffuse hepatic steatosis again noted. Gallbladder is unremarkable. No evidence of biliary ductal dilatation. Pancreas: Moderate to severe acute pancreatitis shows no significant change. No evidence of pancreatic necrosis or mass. Evidence of pancreatic ductal dilatation. Moderate peripancreatic fluid is seen which tracts  inferiorly into the pelvis. This shows mild decrease since previous study. No pseudocysts identified. Spleen: Within normal limits in size and appearance. Adrenals/Urinary Tract: No suspicious masses identified. No evidence  of ureteral calculi or hydronephrosis. Stomach/Bowel: Reactive wall thickening is again seen involving the stomach secondary to acute pancreatitis. Increased diffuse colonic dilatation is seen, consistent with ileus. Vascular/Lymphatic: No pathologically enlarged lymph nodes. No acute vascular findings. Reproductive:  No mass or other significant abnormality. Other:  Increased diffuse body wall edema. Musculoskeletal:  No suspicious bone lesions identified. Review of the MIP images confirms the above findings. IMPRESSION: No evidence of pulmonary embolism. Airspace disease in both upper lobes and right middle lobe, suspicious for pneumonia. Left lower lobe atelectasis and tiny left pleural effusion. Moderate to severe acute pancreatitis, with mild decrease in peripancreatic fluid and ascites. No evidence of pancreatic necrosis or pseudocysts. Worsening colonic ileus. Increased diffuse body wall edema. Stable hepatic steatosis. Electronically Signed   By: Marlaine Hind M.D.   On: 06/15/2022 13:19   CT ABDOMEN PELVIS W CONTRAST  Result Date: 06/15/2022 CLINICAL DATA:  Tachycardia. Elevated D-dimer. Alcohol-induced acute pancreatitis. EXAM: CT ANGIOGRAPHY CHEST CT ABDOMEN AND PELVIS WITH CONTRAST TECHNIQUE: Multidetector CT imaging of the chest was performed using the standard protocol during bolus administration of intravenous contrast. Multiplanar CT image reconstructions and MIPs were obtained to evaluate the vascular anatomy. Multidetector CT imaging of the abdomen and pelvis was performed using the standard protocol during bolus administration of intravenous contrast. RADIATION DOSE REDUCTION: This exam was performed according to the departmental dose-optimization program which includes automated exposure control, adjustment of the mA and/or kV according to patient size and/or use of iterative reconstruction technique. CONTRAST:  153m OMNIPAQUE IOHEXOL 350 MG/ML SOLN COMPARISON:  AP CT on 06/11/2022 FINDINGS:  CTA CHEST FINDINGS Cardiovascular: Satisfactory opacification of pulmonary arteries noted, and no pulmonary emboli identified. No evidence of thoracic aortic dissection or aneurysm. Mediastinum/Nodes: No masses or pathologically enlarged lymph nodes identified. Lungs/Pleura: Airspace disease is seen in both upper lobes and right middle lobe, suspicious for pneumonia. Left lower lobe atelectasis and tiny left pleural effusion noted. Musculoskeletal: No suspicious bone lesions identified. Review of the MIP images confirms the above findings. CT ABDOMEN and PELVIS FINDINGS Hepatobiliary: No hepatic masses identified. Moderate diffuse hepatic steatosis again noted. Gallbladder is unremarkable. No evidence of biliary ductal dilatation. Pancreas: Moderate to severe acute pancreatitis shows no significant change. No evidence of pancreatic necrosis or mass. Evidence of pancreatic ductal dilatation. Moderate peripancreatic fluid is seen which tracts inferiorly into the pelvis. This shows mild decrease since previous study. No pseudocysts identified. Spleen: Within normal limits in size and appearance. Adrenals/Urinary Tract: No suspicious masses identified. No evidence of ureteral calculi or hydronephrosis. Stomach/Bowel: Reactive wall thickening is again seen involving the stomach secondary to acute pancreatitis. Increased diffuse colonic dilatation is seen, consistent with ileus. Vascular/Lymphatic: No pathologically enlarged lymph nodes. No acute vascular findings. Reproductive:  No mass or other significant abnormality. Other:  Increased diffuse body wall edema. Musculoskeletal:  No suspicious bone lesions identified. Review of the MIP images confirms the above findings. IMPRESSION: No evidence of pulmonary embolism. Airspace disease in both upper lobes and right middle lobe, suspicious for pneumonia. Left lower lobe atelectasis and tiny left pleural effusion. Moderate to severe acute pancreatitis, with mild decrease in  peripancreatic fluid and ascites. No evidence of pancreatic necrosis or pseudocysts. Worsening colonic ileus. Increased diffuse body wall edema. Stable hepatic steatosis. Electronically Signed   By: JMyles RosenthalD.  On: 06/15/2022 13:19   CT ABDOMEN PELVIS W CONTRAST  Result Date: 06/11/2022 CLINICAL DATA:  Pancreatitis, abdominal pain, alcohol abuse EXAM: CT ABDOMEN AND PELVIS WITH CONTRAST TECHNIQUE: Multidetector CT imaging of the abdomen and pelvis was performed using the standard protocol following bolus administration of intravenous contrast. RADIATION DOSE REDUCTION: This exam was performed according to the departmental dose-optimization program which includes automated exposure control, adjustment of the mA and/or kV according to patient size and/or use of iterative reconstruction technique. CONTRAST:  164m OMNIPAQUE IOHEXOL 300 MG/ML  SOLN COMPARISON:  06/10/2022, 12/28/2021 FINDINGS: Lower chest: Hypoventilatory changes at the lung bases. Trace left pleural effusion. Hepatobiliary: Hepatic steatosis. Stable hepatomegaly. No biliary duct dilation. The gallbladder is unremarkable. Pancreas: Heterogeneous decreased enhancement within the head and uncinate process of the pancreas consistent with interstitial edema and underlying pancreatitis. There is marked peripancreatic fat stranding and free fluid. No organized fluid collection, abscess, or pseudocyst at this time. No pancreatic duct dilation. Spleen: Normal in size without focal abnormality. Adrenals/Urinary Tract: Punctate less than 2 mm nonobstructing left renal calculus. No obstructive uropathy within either kidney. The adrenals are grossly normal. The bladder is moderately distended, with intramural gas seen throughout the dependent portion of the gallbladder concerning for emphysematous cystitis. A small amount of intraluminal gas within the bladder may reflect recent catheterization. Please correlate with urinalysis. Stomach/Bowel: No bowel  obstruction or ileus. There is secondary wall thickening of the duodenum adjacent to the acute pancreatitis described above. No other wall thickening. Vascular/Lymphatic: No significant vascular findings are present. No enlarged abdominal or pelvic lymph nodes. Reproductive: Uterus and bilateral adnexa are unremarkable. Other: Small volume ascites throughout the lower abdomen and pelvis. No free intraperitoneal gas. No abdominal wall hernia. Musculoskeletal: No acute or destructive bony lesions. Reconstructed images demonstrate no additional findings. IMPRESSION: 1. Severe acute pancreatitis, with edematous changes throughout the head and uncinate process of the pancreas. No evidence of necrosis, fluid collection, abscess, or pseudocyst at this time. 2. Intramural gas within the posterior bladder wall, compatible with emphysematous cystitis. Please correlate with urinalysis. 3. Lower abdominal and pelvic ascites. 4. Trace left pleural effusion. 5. Stable hepatic steatosis. 6. Punctate less than 2 mm nonobstructing left renal calculus. These results will be called to the ordering clinician or representative by the Radiologist Assistant, and communication documented in the PACS or [employer] Electronically Signed   By: MRanda NgoM.D.   On: 06/11/2022 20:29   [place]domen Complete  Result Date: 06/10/2022 CLINICAL DATA:  Epigastric abdominal pain since this morning. EXAM: ABDOMEN ULTRASOUND COMPLETE COMPARISON:  CT scan 12/28/2021 FINDINGS: Gallbladder: No gallstones or wall thickening visualized. No sonographic Murphy sign noted by sonographer. Common bile duct: Diameter: 5.0 mm Liver: There is diffuse increased echogenicity of the liver and decreased through transmission consistent with fatty infiltration. No focal lesions or biliary dilatation. Portal vein is patent on color Doppler imaging with normal direction of blood flow towards the liver. IVC: Normal caliber Pancreas: Visualized portion  unremarkable. Spleen: Not well visualize. Right Kidney: Length: 13.4 cm. Normal renal cortical thickness and echogenicity without focal lesions or hydronephrosis. Left Kidney: Length: 11.4 cm. Normal renal cortical thickness and echogenicity without focal lesions or hydronephrosis. Abdominal aorta: Poorly visualized. Other findings: No ascites IMPRESSION: 1. Diffuse fatty infiltration liver but no hepatic lesions or intrahepatic biliary dilatation. 2. Normal gallbladder. 3. Poorly visualized pancreas, spleen and aorta. 4. Normal kidneys.  No ascites. Electronically Signed   By: PMarijo SanesM.D.   On:  06/10/2022 16:46    Assessment  37 y.o. female with a history of ongoing alcohol use now admitted with acute pancreatitis secondary to alcohol, possible pneumonia, AKI, emphysematous cystitis with E.coli UTI.  Pancreatitis: no evidence for gallstones, implicating medications, family history. No recent lipid panel on file (last in 2020) and will update this. Etiology felt secondary to alcohol. Pain has improved. She does have a colonic ileus and abdominal distension today but notes +flatus and BMs. She feels her distension is not worsened. If any worsening, will need xray. CT on file from yesterday without complicating features. Will start clear liquids today.  Patient also has component of alcoholic hepatitis, with DF today 30.4. Supportive measures recommended. I note her Heather Roberts has been climbing this admission but transaminases have greatly improved. It is not uncommon to see bilirubin rise with lag effect compared to transaminases in this scenario. No biliary ductal dilatation. If worsening pain or transaminases with bilirubin, can consider MRCP but doubt dealing with an obstructive process.    Plan / Recommendations  Lipid panel, INR, CMP, CBC today Advance to clear liquid diet PPI BID Miralax daily Absolute alcohol cessation Follow HFP, INR daily Check hepatitis panel for completeness' sake     LOS: 5 days    06/16/2022, 12:46 PM  Annitta Needs, PhD, ANP-BC [facility]   Addendum: lipid panel with triglycerides 362. Although elevated, not likely high enough to be contributing to pancreatitis. Tbili 9.7, increased from yesterday of 9.3. Admitting Tbili 2.6. Continue with current plan.

## 2022-06-17 ENCOUNTER — Inpatient Hospital Stay (HOSPITAL_COMMUNITY): Payer: Medicaid Other

## 2022-06-17 DIAGNOSIS — K7011 Alcoholic hepatitis with ascites: Secondary | ICD-10-CM | POA: Diagnosis not present

## 2022-06-17 DIAGNOSIS — K852 Alcohol induced acute pancreatitis without necrosis or infection: Secondary | ICD-10-CM | POA: Diagnosis not present

## 2022-06-17 DIAGNOSIS — K567 Ileus, unspecified: Secondary | ICD-10-CM | POA: Diagnosis not present

## 2022-06-17 DIAGNOSIS — R7401 Elevation of levels of liver transaminase levels: Secondary | ICD-10-CM | POA: Diagnosis not present

## 2022-06-17 LAB — URINE CULTURE: Culture: >=100000 — AB

## 2022-06-17 LAB — PROTIME-INR
INR: 1.3 — ABNORMAL HIGH (ref 0.8–1.2)
Prothrombin Time: 16 seconds — ABNORMAL HIGH (ref 11.4–15.2)

## 2022-06-17 LAB — HEPATIC FUNCTION PANEL
ALT: 35 U/L (ref 0–44)
AST: 72 U/L — ABNORMAL HIGH (ref 15–41)
Albumin: 1.8 g/dL — ABNORMAL LOW (ref 3.5–5.0)
Alkaline Phosphatase: 109 U/L (ref 38–126)
Bilirubin, Direct: 5.9 mg/dL — ABNORMAL HIGH (ref 0.0–0.2)
Indirect Bilirubin: 3.1 mg/dL — ABNORMAL HIGH (ref 0.3–0.9)
Total Bilirubin: 9 mg/dL — ABNORMAL HIGH (ref 0.3–1.2)
Total Protein: 6.1 g/dL — ABNORMAL LOW (ref 6.5–8.1)

## 2022-06-17 LAB — CULTURE, BLOOD (ROUTINE X 2): Culture: NO GROWTH

## 2022-06-17 MED ORDER — POLYETHYLENE GLYCOL 3350 17 G PO PACK
17.0000 g | PACK | Freq: Three times a day (TID) | ORAL | Status: DC
  Administered 2022-06-17 – 2022-06-22 (×12): 17 g via ORAL
  Filled 2022-06-17 (×15): qty 1

## 2022-06-17 MED ORDER — ACETAMINOPHEN 325 MG PO TABS
650.0000 mg | ORAL_TABLET | Freq: Once | ORAL | Status: AC
  Administered 2022-06-17: 650 mg via ORAL
  Filled 2022-06-17: qty 2

## 2022-06-17 NOTE — Progress Notes (Addendum)
Gastroenterology Progress Note   Referring Provider: No ref. provider found Primary Care Physician:  Pcp, No Primary Gastroenterologist:  Dr.  Patient ID: Veronica Calhoun; [id number]; [birth year]    Subjective   + Flatus. 1 BM overnight and one BM this morning. Tolerating clear liquids but does not want to eat too much due to her abdominal distention. Breathing about the same    Objective   Vital signs in last 24 hours Temp:  [98 F (36.7 C)-98.6 F (37 C)] 98.2 F (36.8 C) (02/13 0454) Pulse Rate:  [92-109] 107 (02/13 0454) Resp:  [18-26] 19 (02/13 0815) BP: (113-152)/(63-94) 120/84 (02/13 0454) SpO2:  [93 %-99 %] 99 % (02/13 0815) FiO2 (%):  [29 %] 29 % (02/12 1526) Last BM Date : 06/17/22  Physical Exam  General:   Alert and oriented, pleasant Head:  Normocephalic and atraumatic. Eyes:  Mild scleral icterus. Conjuctiva pink.  Mouth:  Without lesions, mucosa pink and moist.  Abdomen:  Bowel sounds hypoactive, distended but soft,. TTP to epigastrium and mid LUQ. No HSM or hernias noted. No rebound or guarding. No masses appreciated  Extremities:  Without clubbing or edema. Neurologic:  Alert and  oriented x4;  grossly normal neurologically. Psych:  Alert and cooperative. Normal mood and affect.  Intake/Output from previous day: 02/12 0701 - 02/13 0700 In: 1969.7 [P.O.:1100; I.V.:769.7; IV Piggyback:100] Out: -  Intake/Output this shift: No intake/output data recorded.  Lab Results  Recent Labs    06/15/22 0519 06/16/22 0849  WBC 21.2* 26.7*  HGB 12.6 11.7*  HCT 36.3 34.0*  PLT 159 210   BMET Recent Labs    06/15/22 0519 06/16/22 0849  NA 133* 134*  K 3.8 3.7  CL 108 107  CO2 17* 19*  GLUCOSE 79 94  BUN 13 16  CREATININE 0.67 0.88  CALCIUM 7.7* 7.9*   LFT Recent Labs    06/15/22 0519 06/16/22 0849  PROT 6.0* 6.1*  ALBUMIN 1.9* 1.9*  AST 71* 67*  ALT 33 32  ALKPHOS 104 113  BILITOT 9.3* 9.7*   PT/INR Recent Labs    06/16/22 0849   LABPROT 16.5*  INR 1.4*   Hepatitis Panel Recent Labs    06/16/22 0858  HEPBSAG NON REACTIVE  HCVAB NON REACTIVE  HEPAIGM NON REACTIVE  HEPBIGM NON REACTIVE    Studies/Results CT Angio Chest Pulmonary Embolism (PE) W or WO Contrast  Result Date: 06/15/2022 CLINICAL DATA:  Tachycardia. Elevated D-dimer. Alcohol-induced acute pancreatitis. EXAM: CT ANGIOGRAPHY CHEST CT ABDOMEN AND PELVIS WITH CONTRAST TECHNIQUE: Multidetector CT imaging of the chest was performed using the standard protocol during bolus administration of intravenous contrast. Multiplanar CT image reconstructions and MIPs were obtained to evaluate the vascular anatomy. Multidetector CT imaging of the abdomen and pelvis was performed using the standard protocol during bolus administration of intravenous contrast. RADIATION DOSE REDUCTION: This exam was performed according to the departmental dose-optimization program which includes automated exposure control, adjustment of the mA and/or kV according to patient size and/or use of iterative reconstruction technique. CONTRAST:  136m OMNIPAQUE IOHEXOL 350 MG/ML SOLN COMPARISON:  AP CT on 06/11/2022 FINDINGS: CTA CHEST FINDINGS Cardiovascular: Satisfactory opacification of pulmonary arteries noted, and no pulmonary emboli identified. No evidence of thoracic aortic dissection or aneurysm. Mediastinum/Nodes: No masses or pathologically enlarged lymph nodes identified. Lungs/Pleura: Airspace disease is seen in both upper lobes and right middle lobe, suspicious for pneumonia. Left lower lobe atelectasis and tiny left pleural effusion noted. Musculoskeletal: No suspicious bone lesions  identified. Review of the MIP images confirms the above findings. CT ABDOMEN and PELVIS FINDINGS Hepatobiliary: No hepatic masses identified. Moderate diffuse hepatic steatosis again noted. Gallbladder is unremarkable. No evidence of biliary ductal dilatation. Pancreas: Moderate to severe acute pancreatitis shows  no significant change. No evidence of pancreatic necrosis or mass. Evidence of pancreatic ductal dilatation. Moderate peripancreatic fluid is seen which tracts inferiorly into the pelvis. This shows mild decrease since previous study. No pseudocysts identified. Spleen: Within normal limits in size and appearance. Adrenals/Urinary Tract: No suspicious masses identified. No evidence of ureteral calculi or hydronephrosis. Stomach/Bowel: Reactive wall thickening is again seen involving the stomach secondary to acute pancreatitis. Increased diffuse colonic dilatation is seen, consistent with ileus. Vascular/Lymphatic: No pathologically enlarged lymph nodes. No acute vascular findings. Reproductive:  No mass or other significant abnormality. Other:  Increased diffuse body wall edema. Musculoskeletal:  No suspicious bone lesions identified. Review of the MIP images confirms the above findings. IMPRESSION: No evidence of pulmonary embolism. Airspace disease in both upper lobes and right middle lobe, suspicious for pneumonia. Left lower lobe atelectasis and tiny left pleural effusion. Moderate to severe acute pancreatitis, with mild decrease in peripancreatic fluid and ascites. No evidence of pancreatic necrosis or pseudocysts. Worsening colonic ileus. Increased diffuse body wall edema. Stable hepatic steatosis. Electronically Signed   By: Marlaine Hind M.D.   On: 06/15/2022 13:19   CT ABDOMEN PELVIS W CONTRAST  Result Date: 06/15/2022 CLINICAL DATA:  Tachycardia. Elevated D-dimer. Alcohol-induced acute pancreatitis. EXAM: CT ANGIOGRAPHY CHEST CT ABDOMEN AND PELVIS WITH CONTRAST TECHNIQUE: Multidetector CT imaging of the chest was performed using the standard protocol during bolus administration of intravenous contrast. Multiplanar CT image reconstructions and MIPs were obtained to evaluate the vascular anatomy. Multidetector CT imaging of the abdomen and pelvis was performed using the standard protocol during bolus  administration of intravenous contrast. RADIATION DOSE REDUCTION: This exam was performed according to the departmental dose-optimization program which includes automated exposure control, adjustment of the mA and/or kV according to patient size and/or use of iterative reconstruction technique. CONTRAST:  151m OMNIPAQUE IOHEXOL 350 MG/ML SOLN COMPARISON:  AP CT on 06/11/2022 FINDINGS: CTA CHEST FINDINGS Cardiovascular: Satisfactory opacification of pulmonary arteries noted, and no pulmonary emboli identified. No evidence of thoracic aortic dissection or aneurysm. Mediastinum/Nodes: No masses or pathologically enlarged lymph nodes identified. Lungs/Pleura: Airspace disease is seen in both upper lobes and right middle lobe, suspicious for pneumonia. Left lower lobe atelectasis and tiny left pleural effusion noted. Musculoskeletal: No suspicious bone lesions identified. Review of the MIP images confirms the above findings. CT ABDOMEN and PELVIS FINDINGS Hepatobiliary: No hepatic masses identified. Moderate diffuse hepatic steatosis again noted. Gallbladder is unremarkable. No evidence of biliary ductal dilatation. Pancreas: Moderate to severe acute pancreatitis shows no significant change. No evidence of pancreatic necrosis or mass. Evidence of pancreatic ductal dilatation. Moderate peripancreatic fluid is seen which tracts inferiorly into the pelvis. This shows mild decrease since previous study. No pseudocysts identified. Spleen: Within normal limits in size and appearance. Adrenals/Urinary Tract: No suspicious masses identified. No evidence of ureteral calculi or hydronephrosis. Stomach/Bowel: Reactive wall thickening is again seen involving the stomach secondary to acute pancreatitis. Increased diffuse colonic dilatation is seen, consistent with ileus. Vascular/Lymphatic: No pathologically enlarged lymph nodes. No acute vascular findings. Reproductive:  No mass or other significant abnormality. Other:  Increased  diffuse body wall edema. Musculoskeletal:  No suspicious bone lesions identified. Review of the MIP images confirms the above findings. IMPRESSION: No evidence of  pulmonary embolism. Airspace disease in both upper lobes and right middle lobe, suspicious for pneumonia. Left lower lobe atelectasis and tiny left pleural effusion. Moderate to severe acute pancreatitis, with mild decrease in peripancreatic fluid and ascites. No evidence of pancreatic necrosis or pseudocysts. Worsening colonic ileus. Increased diffuse body wall edema. Stable hepatic steatosis. Electronically Signed   By: Marlaine Hind M.D.   On: 06/15/2022 13:19   CT ABDOMEN PELVIS W CONTRAST  Result Date: 06/11/2022 CLINICAL DATA:  Pancreatitis, abdominal pain, alcohol abuse EXAM: CT ABDOMEN AND PELVIS WITH CONTRAST TECHNIQUE: Multidetector CT imaging of the abdomen and pelvis was performed using the standard protocol following bolus administration of intravenous contrast. RADIATION DOSE REDUCTION: This exam was performed according to the departmental dose-optimization program which includes automated exposure control, adjustment of the mA and/or kV according to patient size and/or use of iterative reconstruction technique. CONTRAST:  134m OMNIPAQUE IOHEXOL 300 MG/ML  SOLN COMPARISON:  06/10/2022, 12/28/2021 FINDINGS: Lower chest: Hypoventilatory changes at the lung bases. Trace left pleural effusion. Hepatobiliary: Hepatic steatosis. Stable hepatomegaly. No biliary duct dilation. The gallbladder is unremarkable. Pancreas: Heterogeneous decreased enhancement within the head and uncinate process of the pancreas consistent with interstitial edema and underlying pancreatitis. There is marked peripancreatic fat stranding and free fluid. No organized fluid collection, abscess, or pseudocyst at this time. No pancreatic duct dilation. Spleen: Normal in size without focal abnormality. Adrenals/Urinary Tract: Punctate less than 2 mm nonobstructing left renal  calculus. No obstructive uropathy within either kidney. The adrenals are grossly normal. The bladder is moderately distended, with intramural gas seen throughout the dependent portion of the gallbladder concerning for emphysematous cystitis. A small amount of intraluminal gas within the bladder may reflect recent catheterization. Please correlate with urinalysis. Stomach/Bowel: No bowel obstruction or ileus. There is secondary wall thickening of the duodenum adjacent to the acute pancreatitis described above. No other wall thickening. Vascular/Lymphatic: No significant vascular findings are present. No enlarged abdominal or pelvic lymph nodes. Reproductive: Uterus and bilateral adnexa are unremarkable. Other: Small volume ascites throughout the lower abdomen and pelvis. No free intraperitoneal gas. No abdominal wall hernia. Musculoskeletal: No acute or destructive bony lesions. Reconstructed images demonstrate no additional findings. IMPRESSION: 1. Severe acute pancreatitis, with edematous changes throughout the head and uncinate process of the pancreas. No evidence of necrosis, fluid collection, abscess, or pseudocyst at this time. 2. Intramural gas within the posterior bladder wall, compatible with emphysematous cystitis. Please correlate with urinalysis. 3. Lower abdominal and pelvic ascites. 4. Trace left pleural effusion. 5. Stable hepatic steatosis. 6. Punctate less than 2 mm nonobstructing left renal calculus. These results will be called to the ordering clinician or representative by the Radiologist Assistant, and communication documented in the PACS or [employer] Electronically Signed   By: MRanda NgoM.D.   On: 06/11/2022 20:29   [place]domen Complete  Result Date: 06/10/2022 CLINICAL DATA:  Epigastric abdominal pain since this morning. EXAM: ABDOMEN ULTRASOUND COMPLETE COMPARISON:  CT scan 12/28/2021 FINDINGS: Gallbladder: No gallstones or wall thickening visualized. No sonographic Murphy sign  noted by sonographer. Common bile duct: Diameter: 5.0 mm Liver: There is diffuse increased echogenicity of the liver and decreased through transmission consistent with fatty infiltration. No focal lesions or biliary dilatation. Portal vein is patent on color Doppler imaging with normal direction of blood flow towards the liver. IVC: Normal caliber Pancreas: Visualized portion unremarkable. Spleen: Not well visualize. Right Kidney: Length: 13.4 cm. Normal renal cortical thickness and echogenicity without focal lesions or hydronephrosis.  Left Kidney: Length: 11.4 cm. Normal renal cortical thickness and echogenicity without focal lesions or hydronephrosis. Abdominal aorta: Poorly visualized. Other findings: No ascites IMPRESSION: 1. Diffuse fatty infiltration liver but no hepatic lesions or intrahepatic biliary dilatation. 2. Normal gallbladder. 3. Poorly visualized pancreas, spleen and aorta. 4. Normal kidneys.  No ascites. Electronically Signed   By: Marijo Sanes M.D.   On: 06/10/2022 16:46    Assessment  37 y.o. female with a history of anxiety, HTN, alcohol abuse currently admitted with acute pancreatitis secondary to alcohol, possible pneumonia, AKI, emphysematous cystitis with E. coli UTI.  GI consulted for pancreatitis.  Pancreatitis: Imaging without evidence of gallstones.  Not currently on any medications to implicate pancreatitis and history.  Lipid panel with mildly elevated triglycerides to 362 however not likely high enough to contribute to pancreatitis.  Etiology likely secondary to alcohol use.  Pain remains about the same, no increase in pain with meals.     Ileus: Abdominal distention worsened per patient.  CT A/P from 2/11 with increased diffuse colonic dilatation consistent with ileus. Able to tolerate clear liquids. Continues to pass flatus and has had 2 BM since last evening. Given worsening distention will obtain KUB. Abdominal wall edema likely also contributing to abdominal distention.  Some flank distention as well.   Alcoholic hepatitis: DF [id number] yesterday indicating no need for steroids, no labs today - will need to update.  Recent CT 06/15/2022 with stable hepatic steatosis without any biliary ductal dilation.  Labs yesterday with AST 67, normal ALT and alk phos.  T. bili continues to climb up to 9.7 (2.6 on admission).  Acute hepatitis panel negative. Will recheck HFP today going forward recommend daily HFP and INR.   Leukocytosis: WBC normal on admission 8.9.  WBC rose to 22.1 on 2/8 and there was slight improvement and increased to 26.7 yesterday.  Likely reactive in the setting of possible pneumonia and E. coli UTI.  Plan / Recommendations  HFP and INR today to recalculate DF Daily HFP and INR Alcohol cessation Miralax daily PPI BID Continue clear liquids KUB now  Addendum: Albumin 1.8, AST 72, ALT 35, alk phos 109, T. Bili 9 (Direct 5.9, indirect 3.1). INR 1.3, PT 16.   DF: 22.8 (Pt control of 13). No indication for steroids.    LOS: 6 days    06/17/2022, 9:38 AM   Venetia Night, MSN, FNP-BC, AGACNP-BC [facility]

## 2022-06-17 NOTE — Progress Notes (Signed)
Took over care of patient at this time

## 2022-06-17 NOTE — Plan of Care (Signed)

## 2022-06-17 NOTE — Progress Notes (Signed)
PROGRESS NOTE    Veronica Calhoun  [id number] DOB: Feb 09, 1986 DOA: 06/10/2022 PCP: Pcp, No   Brief Narrative:    Veronica Calhoun is a 37 y.o. female with medical history significant of alcohol dependence, tobacco use disorder, hypertension-not on medication, generalized anxiety disorder-not on medication, and more presents the ED with a chief complaint of abdominal pain.  She does have daily alcohol use and was admitted with alcohol induced acute pancreatitis.  She is also noted to have hypertensive crisis and is now requiring Cardizem drip.  She continues to have some ongoing sinus tachycardia.  It appears that she has been noncompliant with her home blood pressure medications.  CT abdomen and pelvis performed 2/7 with findings of severe pancreatitis and emphysematous cystitis.  Started on Rocephin with urine cultures showing E Coli with sensitivities pending.  She still remains tachycardic and D-dimer noted to be elevated.  She underwent further imaging on 2/11 with CT angiogram of the chest with findings of multilobar pneumonia and no PE.  CT of the abdomen demonstrating ongoing moderate to severe pancreatitis with no other acute complications.  GI following and assisting with management.  Assessment & Plan:   Principal Problem:   Alcohol induced acute pancreatitis Active Problems:   Anxiety   Alcohol use disorder, severe, dependence (HCC)   Acute pancreatitis   Hypertensive crisis   Transaminitis   Tobacco use disorder   Tachycardia   Hypokalemia  Assessment and Plan:   Alcohol induced acute severe pancreatitis -Appears to be a recurrent problem in the setting of her alcohol use drinks daily -Abdominal pain, transaminitis, elevated lipase -Abd US shows normal size for common bile duct and normal gall bladder -Patient is not on any medications that would be likely to cause pancreatitis -Advised on the importance of EtOH cessation -Continue clear liquids -Aggressive IV  fluids to continue -Pain control -Repeat CT 2/11 with moderate to severe pancreatitis with ongoing symptoms, appreciate GI evaluation -Continue to monitor with a.m. labs   Sinus tachycardia ongoing -Okay for transfer to telemetry today - Related to volume depletion in the setting of above, continue aggressive IV fluid -Also related to pain and some alcohol withdrawal -Patient also has not been taking her home metoprolol which has been resumed -Continue to monitor closely in the ICU -TSH elevated at 4.561, free T4 within normal limits -Increased metoprolol to 50 mg twice daily 2/9 -D-dimer elevated and PE study negative for PE -Improved with better pain control on Dilaudid PCA  Multilobar pneumonia possibly HCAP -Started on cefepime and discontinue Rocephin  AKI-resolving Monitor strict I's and O's Avoid nephrotoxic agents Continue to follow labs and continue aggressive IV fluid  Emphysematous cystitis with E. coli UTI -Treat empirically with Rocephin and follow sensitivities -Patient was sensitive to Rocephin, but now appears to have HCAP and treatment brought into cefepime due to persistent tachycardia and leukocytosis   Tobacco use disorder - Smokes 1 pack/day - Nicotine patch ordered - Continue to monitor   Transaminitis-downtrending -related to EtOH and pancreatits -US shows normal CBD  - not likely to have an obstructing stone -If transaminases continue to rise on tomorrow's BMP consider MRCP -Hold hepatotoxic agents when possible -Continue to monitor   Hypertensive crisis-resolved -Noted to previously be on home clonidine, metoprolol, and HCTZ -Resume on metoprolol and clonidine for now and HCTZ later -Increased metoprolol with noted ongoing sinus tachycardia to 50 mg twice daily   Alcohol use disorder, severe, dependence ([place]) -CIWA protocol -Advised on importance of  cessation   Anxiety -Ativan given in the ED -Continue ativan per CIWA protocol -Continue to  monitor  Obesity BMI 42.02    DVT prophylaxis:Heparin Code Status: Full Family Communication: Sister at bedside 2/13 Disposition Plan:  Status is: Inpatient Remains inpatient appropriate because: Need for IV fluid   Consultants:  GI  Procedures:  See below  Antimicrobials:  Anti-infectives (From admission, onward)    Start     Dose/Rate Route Frequency Ordered Stop   06/11/22 2145  cefTRIAXone (ROCEPHIN) 1 g in sodium chloride 0.9 % 100 mL IVPB        1 g 200 mL/hr over 30 Minutes Intravenous Every 24 hours 06/11/22 2053         Subjective: Patient seen and evaluated today with ongoing severe abdominal pain today.  She denies any nausea or vomiting.  Objective: Vitals:   06/14/22 0031 06/14/22 0106 06/14/22 0532 06/14/22 0750  BP:  110/71 (!) 142/95 (!) 151/92  Pulse: (!) 113 (!) 109 (!) 110 (!) 118  Resp:  (!) 30 (!) 30 (!) 21  Temp:  99 F (37.2 C) 98.1 F (36.7 C)   TempSrc:  Oral    SpO2:  95% 96% 96%  Weight:      Height:        Intake/Output Summary (Last 24 hours) at 06/14/2022 1057 Last data filed at 06/14/2022 0900 Gross per 24 hour  Intake 3701.24 ml  Output --  Net 3701.24 ml   Filed Weights   06/10/22 2119 06/11/22 0500 06/13/22 2207  Weight: 118.1 kg 118.1 kg 130.9 kg    Examination:  General exam: Appears calm and comfortable, obese Respiratory system: Clear to auscultation. Respiratory effort normal. Cardiovascular system: S1 & S2 heard, RRR.  Tachycardic Gastrointestinal system: Abdomen is tender to palpation and distended Central nervous system: Alert and awake Extremities: No edema Skin: No significant lesions noted Psychiatry: Flat affect.    Data Reviewed: I have personally reviewed following labs and imaging studies  CBC: Recent Labs  Lab 06/10/22 1341 06/11/22 0350 06/12/22 0446 06/13/22 0524 06/14/22 0538  WBC 8.9 15.7* 22.1* 19.8* 19.5*  NEUTROABS  --  12.9*  --   --   --   HGB 16.1* 18.0* 15.5* 14.4 12.8   HCT 45.2 52.0* 45.6 41.8 37.0  MCV 97.4 100.2* 103.9* 102.0* 101.1*  PLT 263 208 112* 102* [id number]*   Basic Metabolic Panel: Recent Labs  Lab 06/10/22 1341 06/11/22 0350 06/12/22 0446 06/13/22 0524 06/14/22 0538  NA 133* 132* 133* 131* 132*  K 3.3* 4.5 4.5 4.4 4.0  CL 100 101 105 108 105  CO2 19* 18* 20* 16* 18*  GLUCOSE 209* 221* 154* 82 68*  BUN <5* 8 18 27* 16  CREATININE 0.54 0.85 1.06* 0.94 0.70  CALCIUM 8.4* 8.2* 6.8* 6.7* 7.3*  MG  --  1.6* 1.9 1.8 2.0   GFR: Estimated Creatinine Clearance: 137.5 mL/min (by C-G formula based on SCr of 0.7 mg/dL). Liver Function Tests: Recent Labs  Lab 06/10/22 1341 06/11/22 0350 06/12/22 0446 06/13/22 0524 06/14/22 0538  AST 308* 142* 65* 59* 56*  ALT 115* 83* 45* 36 32  ALKPHOS 169* 133* 100 95 94  BILITOT 1.2 1.2 2.6* 4.5* 7.6*  PROT 8.2* 7.5 5.9* 5.9* 6.0*  ALBUMIN 3.5 3.1* 2.3* 2.2* 2.0*   Recent Labs  Lab 06/10/22 1341 06/10/22 2055  LIPASE 965* 637*   No results for input(s): "AMMONIA" in the last 168 hours. Coagulation Profile: No results for input(s): "  INR", "PROTIME" in the last 168 hours. Cardiac Enzymes: No results for input(s): "CKTOTAL", "CKMB", "CKMBINDEX", "TROPONINI" in the last 168 hours. BNP (last 3 results) No results for input(s): "PROBNP" in the last 8760 hours. HbA1C: No results for input(s): "HGBA1C" in the last 72 hours. CBG: No results for input(s): "GLUCAP" in the last 168 hours. Lipid Profile: No results for input(s): "CHOL", "HDL", "LDLCALC", "TRIG", "CHOLHDL", "LDLDIRECT" in the last 72 hours. Thyroid Function Tests: Recent Labs    06/12/22 1322 06/13/22 0750  TSH 4.561*  --   FREET4  --  0.98   Anemia Panel: No results for input(s): "VITAMINB12", "FOLATE", "FERRITIN", "TIBC", "IRON", "RETICCTPCT" in the last 72 hours. Sepsis Labs: No results for input(s): "PROCALCITON", "LATICACIDVEN" in the last 168 hours.  Recent Results (from the past 240 hour(s))  MRSA Next Gen by PCR,  Nasal     Status: None   Collection Time: 06/10/22  8:29 PM   Specimen: Nasal Mucosa; Nasal Swab  Result Value Ref Range Status   MRSA by PCR Next Gen NOT DETECTED NOT DETECTED Final    Comment: (NOTE) The GeneXpert MRSA Assay (FDA approved for NASAL specimens only), is one component of a comprehensive MRSA colonization surveillance program. It is not intended to diagnose MRSA infection nor to guide or monitor treatment for MRSA infections. Test performance is not FDA approved in patients less than 93 years old. Performed at [facility], [address] [place], [place] [address]   Urine Culture (for pregnant, neutropenic or urologic patients or patients with an indwelling urinary catheter)     Status: Abnormal   Collection Time: 06/11/22  9:40 PM   Specimen: Urine, Clean Catch  Result Value Ref Range Status   Specimen Description URINE, CLEAN CATCH  Final   Special Requests NONE  Final   Culture >=100,000 COLONIES/mL ESCHERICHIA COLI (A)  Final   Report Status 06/14/2022 FINAL  Final   Organism ID, Bacteria ESCHERICHIA COLI (A)  Final      Susceptibility   Escherichia coli - MIC*    AMPICILLIN >=32 RESISTANT Resistant     CEFEPIME <=0.12 SENSITIVE Sensitive     CEFTRIAXONE <=0.25 SENSITIVE Sensitive     CIPROFLOXACIN >=4 RESISTANT Resistant     GENTAMICIN <=1 SENSITIVE Sensitive     IMIPENEM <=0.25 SENSITIVE Sensitive     NITROFURANTOIN <=16 SENSITIVE Sensitive     TRIMETH/SULFA >=320 RESISTANT Resistant     AMPICILLIN/SULBACTAM 4 SENSITIVE Sensitive     PIP/TAZO Value in next row Sensitive      <=4 SENSITIVEPerformed at [facility] [address], [place], [place] [address]    * >=100,000 COLONIES/mL ESCHERICHIA COLI  Culture, blood (Routine X 2) w Reflex to ID Panel     Status: None (Preliminary result)   Collection Time: 06/12/22  8:28 AM   Specimen: BLOOD  Result Value Ref Range Status   Specimen Description BLOOD RIGHT HAND  Final   Special Requests   Final     BOTTLES DRAWN AEROBIC AND ANAEROBIC Blood Culture results may not be optimal due to an inadequate volume of blood received in culture bottles   Culture   Final    NO GROWTH 2 DAYS Performed at [facility], [address] [place], [place] [address]    Report Status PENDING  Incomplete         Radiology Studies: No results found.      Scheduled Meds:  Chlorhexidine Gluconate Cloth  6 each Topical Q0600  cloNIDine  0.2 mg Oral BID   folic acid  1 mg Oral Daily   heparin  5,000 Units Subcutaneous Q8H   LORazepam  0-4 mg Intravenous Q12H   metoprolol tartrate  50 mg Oral BID   multivitamin with minerals  1 tablet Oral Daily   nicotine  21 mg Transdermal Daily   pantoprazole (PROTONIX) IV  40 mg Intravenous Q12H   polyethylene glycol  17 g Oral Daily   thiamine  100 mg Oral Daily   Or   thiamine  100 mg Intravenous Daily   Continuous Infusions:  sodium chloride 150 mL/hr at 06/14/22 0643   cefTRIAXone (ROCEPHIN)  IV 200 mL/hr at 06/13/22 2133   niCARDipine Stopped (06/12/22 1107)     LOS: 3 days    Time spent: 35 minutes    Asyria Kolander Darleen Crocker, DO [facility]  If 7PM-7AM, please contact night-coverage [email or web address] 06/14/2022, 10:57 AM

## 2022-06-17 NOTE — Progress Notes (Signed)
Mobility Specialist Progress Note:    06/17/22 1200  Mobility  Activity Ambulated with assistance in room  Level of Assistance Modified independent, requires aide device or extra time  Assistive Device Other (Comment) (IV pole)  Distance Ambulated (ft) 30 ft  Activity Response Tolerated well  Mobility Referral Yes  $Mobility charge 1 Mobility   Pt was agreeable to mobility session. Pt held onto IV pole for stability, ambulated through doorway and back to bed. Tolerated well, had audible SOB throughout, c/o back pain. Unreliable SpO2 readings on pulse ox. Returned pt to bed with all needs met.   Royetta Crochet Mobility Specialist Please contact via Solicitor or  Rehab office at [phone]

## 2022-06-18 DIAGNOSIS — K8591 Acute pancreatitis with uninfected necrosis, unspecified: Secondary | ICD-10-CM

## 2022-06-18 DIAGNOSIS — K852 Alcohol induced acute pancreatitis without necrosis or infection: Secondary | ICD-10-CM | POA: Diagnosis not present

## 2022-06-18 LAB — COMPREHENSIVE METABOLIC PANEL
ALT: 37 U/L (ref 0–44)
AST: 80 U/L — ABNORMAL HIGH (ref 15–41)
Albumin: 1.7 g/dL — ABNORMAL LOW (ref 3.5–5.0)
Alkaline Phosphatase: 113 U/L (ref 38–126)
Anion gap: 6 (ref 5–15)
BUN: 16 mg/dL (ref 6–20)
CO2: 20 mmol/L — ABNORMAL LOW (ref 22–32)
Calcium: 8 mg/dL — ABNORMAL LOW (ref 8.9–10.3)
Chloride: 111 mmol/L (ref 98–111)
Creatinine, Ser: 0.54 mg/dL (ref 0.44–1.00)
GFR, Estimated: >60 mL/min (ref >60)
Glucose, Bld: 96 mg/dL (ref 70–99)
Potassium: 3.6 mmol/L (ref 3.5–5.1)
Sodium: 137 mmol/L (ref 135–145)
Total Bilirubin: 9.3 mg/dL — ABNORMAL HIGH (ref 0.3–1.2)
Total Protein: 6.1 g/dL — ABNORMAL LOW (ref 6.5–8.1)

## 2022-06-18 LAB — CBC
HCT: 33.5 % — ABNORMAL LOW (ref 36.0–46.0)
Hemoglobin: 11.4 g/dL — ABNORMAL LOW (ref 12.0–15.0)
MCH: 34.4 pg — ABNORMAL HIGH (ref 26.0–34.0)
MCHC: 34 g/dL (ref 30.0–36.0)
MCV: 101.2 fL — ABNORMAL HIGH (ref 80.0–100.0)
Platelets: 279 10*3/uL (ref 150–400)
RBC: 3.31 MIL/uL — ABNORMAL LOW (ref 3.87–5.11)
RDW: 15.8 % — ABNORMAL HIGH (ref 11.5–15.5)
WBC: 33.2 10*3/uL — ABNORMAL HIGH (ref 4.0–10.5)
nRBC: 0.1 % (ref 0.0–0.2)

## 2022-06-18 LAB — MAGNESIUM: Magnesium: 2.4 mg/dL (ref 1.7–2.4)

## 2022-06-18 LAB — PROTIME-INR
INR: 1.3 — ABNORMAL HIGH (ref 0.8–1.2)
Prothrombin Time: 16.2 seconds — ABNORMAL HIGH (ref 11.4–15.2)

## 2022-06-18 MED ORDER — IPRATROPIUM-ALBUTEROL 0.5-2.5 (3) MG/3ML IN SOLN
3.0000 mL | Freq: Four times a day (QID) | RESPIRATORY_TRACT | Status: DC
  Administered 2022-06-18 (×2): 3 mL via RESPIRATORY_TRACT
  Filled 2022-06-18 (×2): qty 3

## 2022-06-18 MED ORDER — IPRATROPIUM-ALBUTEROL 0.5-2.5 (3) MG/3ML IN SOLN
3.0000 mL | Freq: Three times a day (TID) | RESPIRATORY_TRACT | Status: DC
  Administered 2022-06-19: 3 mL via RESPIRATORY_TRACT
  Filled 2022-06-18 (×2): qty 3

## 2022-06-18 MED ORDER — ALBUTEROL SULFATE (2.5 MG/3ML) 0.083% IN NEBU
2.5000 mg | INHALATION_SOLUTION | Freq: Four times a day (QID) | RESPIRATORY_TRACT | Status: DC | PRN
  Administered 2022-06-20 – 2022-06-21 (×3): 2.5 mg via RESPIRATORY_TRACT
  Filled 2022-06-18 (×3): qty 3

## 2022-06-18 NOTE — Progress Notes (Signed)
PROGRESS NOTE    Veronica Calhoun  [id number] DOB: 11/16/1985 DOA: 06/10/2022 PCP: Pcp, No   Brief Narrative:    Veronica Calhoun is a 37 y.o. female with medical history significant of alcohol dependence, tobacco use disorder, hypertension-not on medication, generalized anxiety disorder-not on medication, and more presents the ED with a chief complaint of abdominal pain.  She does have daily alcohol use and was admitted with alcohol induced acute pancreatitis.  She is also noted to have hypertensive crisis and is now requiring Cardizem drip.  She continues to have some ongoing sinus tachycardia.  It appears that she has been noncompliant with her home blood pressure medications.  CT abdomen and pelvis performed 2/7 with findings of severe pancreatitis and emphysematous cystitis.  Started on Rocephin with urine cultures showing E Coli with sensitivities pending.  She still remains tachycardic and D-dimer noted to be elevated.  She underwent further imaging on 2/11 with CT angiogram of the chest with findings of multilobar pneumonia and no PE.  CT of the abdomen demonstrating ongoing moderate to severe pancreatitis with no other acute complications.  GI following and assisting with management.  Assessment & Plan:   Principal Problem:   Alcohol induced acute pancreatitis Active Problems:   Anxiety   Alcohol use disorder, severe, dependence (HCC)   Acute pancreatitis   Hypertensive crisis   Transaminitis   Tobacco use disorder   Tachycardia   Hypokalemia  Assessment and Plan:   Alcohol induced acute severe pancreatitis -Appears to be a recurrent problem in the setting of her alcohol use drinks daily -Abdominal pain, transaminitis, elevated lipase -Abd US shows normal size for common bile duct and normal gall bladder -Patient is not on any medications that would be likely to cause pancreatitis -Advised on the importance of EtOH cessation -Continue clear liquids -Aggressive IV  fluids to continue -Pain control -Repeat CT 2/11 with moderate to severe pancreatitis with ongoing symptoms, appreciate GI evaluation -Continue to monitor with a.m. labs   Sinus tachycardia ongoing -Okay for transfer to telemetry today - Related to volume depletion in the setting of above, continue aggressive IV fluid -Also related to pain and some alcohol withdrawal -Patient also has not been taking her home metoprolol which has been resumed -Continue to monitor closely in the ICU -TSH elevated at 4.561, free T4 within normal limits -Increased metoprolol to 50 mg twice daily 2/9 -D-dimer elevated and PE study negative for PE -Improved with better pain control on Dilaudid PCA  Multilobar pneumonia possibly HCAP -Started on cefepime and discontinue Rocephin  Worsening leukocytosis Likely related to above factors, continue to monitor  AKI-resolving Monitor strict I's and O's Avoid nephrotoxic agents Continue to follow labs and continue aggressive IV fluid  Emphysematous cystitis with E. coli UTI -Treat empirically with Rocephin and follow sensitivities -Patient was sensitive to Rocephin, but now appears to have HCAP and treatment brought into cefepime due to persistent tachycardia and leukocytosis   Tobacco use disorder - Smokes 1 pack/day - Nicotine patch ordered - Continue to monitor   Transaminitis-downtrending -related to EtOH and pancreatits -US shows normal CBD  - not likely to have an obstructing stone -If transaminases continue to rise on tomorrow's BMP consider MRCP -Hold hepatotoxic agents when possible -Continue to monitor   Hypertensive crisis-resolved -Noted to previously be on home clonidine, metoprolol, and HCTZ -Resume on metoprolol and clonidine for now and HCTZ later -Increased metoprolol with noted ongoing sinus tachycardia to 50 mg twice daily   Alcohol  use disorder, severe, dependence ([place]) -CIWA protocol -Advised on importance of cessation    Anxiety -Ativan given in the ED -Continue ativan per CIWA protocol -Continue to monitor  Obesity BMI 42.02    DVT prophylaxis:Heparin Code Status: Full Family Communication: Sister at bedside 2/13 Disposition Plan:  Status is: Inpatient Remains inpatient appropriate because: Need for IV fluid   Consultants:  GI  Procedures:  See below  Antimicrobials:  Anti-infectives (From admission, onward)    Start     Dose/Rate Route Frequency Ordered Stop   06/11/22 2145  cefTRIAXone (ROCEPHIN) 1 g in sodium chloride 0.9 % 100 mL IVPB        1 g 200 mL/hr over 30 Minutes Intravenous Every 24 hours 06/11/22 2053         Subjective: Patient seen and evaluated today with ongoing severe abdominal pain today.  She denies any nausea or vomiting.  She is noted to have some wheezing with her pneumonia as well and would like more frequent breathing treatments.  Objective: Vitals:   06/14/22 0031 06/14/22 0106 06/14/22 0532 06/14/22 0750  BP:  110/71 (!) 142/95 (!) 151/92  Pulse: (!) 113 (!) 109 (!) 110 (!) 118  Resp:  (!) 30 (!) 30 (!) 21  Temp:  99 F (37.2 C) 98.1 F (36.7 C)   TempSrc:  Oral    SpO2:  95% 96% 96%  Weight:      Height:        Intake/Output Summary (Last 24 hours) at 06/14/2022 1057 Last data filed at 06/14/2022 0900 Gross per 24 hour  Intake 3701.24 ml  Output --  Net 3701.24 ml   Filed Weights   06/10/22 2119 06/11/22 0500 06/13/22 2207  Weight: 118.1 kg 118.1 kg 130.9 kg    Examination:  General exam: Appears calm and comfortable, obese Respiratory system: Clear to auscultation. Respiratory effort normal. Cardiovascular system: S1 & S2 heard, RRR.  Tachycardic Gastrointestinal system: Abdomen is tender to palpation and distended Central nervous system: Alert and awake Extremities: No edema Skin: No significant lesions noted Psychiatry: Flat affect.    Data Reviewed: I have personally reviewed following labs and imaging  studies  CBC: Recent Labs  Lab 06/10/22 1341 06/11/22 0350 06/12/22 0446 06/13/22 0524 06/14/22 0538  WBC 8.9 15.7* 22.1* 19.8* 19.5*  NEUTROABS  --  12.9*  --   --   --   HGB 16.1* 18.0* 15.5* 14.4 12.8  HCT 45.2 52.0* 45.6 41.8 37.0  MCV 97.4 100.2* 103.9* 102.0* 101.1*  PLT 263 208 112* 102* [id number]*   Basic Metabolic Panel: Recent Labs  Lab 06/10/22 1341 06/11/22 0350 06/12/22 0446 06/13/22 0524 06/14/22 0538  NA 133* 132* 133* 131* 132*  K 3.3* 4.5 4.5 4.4 4.0  CL 100 101 105 108 105  CO2 19* 18* 20* 16* 18*  GLUCOSE 209* 221* 154* 82 68*  BUN <5* 8 18 27* 16  CREATININE 0.54 0.85 1.06* 0.94 0.70  CALCIUM 8.4* 8.2* 6.8* 6.7* 7.3*  MG  --  1.6* 1.9 1.8 2.0   GFR: Estimated Creatinine Clearance: 137.5 mL/min (by C-G formula based on SCr of 0.7 mg/dL). Liver Function Tests: Recent Labs  Lab 06/10/22 1341 06/11/22 0350 06/12/22 0446 06/13/22 0524 06/14/22 0538  AST 308* 142* 65* 59* 56*  ALT 115* 83* 45* 36 32  ALKPHOS 169* 133* 100 95 94  BILITOT 1.2 1.2 2.6* 4.5* 7.6*  PROT 8.2* 7.5 5.9* 5.9* 6.0*  ALBUMIN 3.5 3.1* 2.3* 2.2* 2.0*  Recent Labs  Lab 06/10/22 1341 06/10/22 2055  LIPASE 965* 637*   No results for input(s): "AMMONIA" in the last 168 hours. Coagulation Profile: No results for input(s): "INR", "PROTIME" in the last 168 hours. Cardiac Enzymes: No results for input(s): "CKTOTAL", "CKMB", "CKMBINDEX", "TROPONINI" in the last 168 hours. BNP (last 3 results) No results for input(s): "PROBNP" in the last 8760 hours. HbA1C: No results for input(s): "HGBA1C" in the last 72 hours. CBG: No results for input(s): "GLUCAP" in the last 168 hours. Lipid Profile: No results for input(s): "CHOL", "HDL", "LDLCALC", "TRIG", "CHOLHDL", "LDLDIRECT" in the last 72 hours. Thyroid Function Tests: Recent Labs    06/12/22 1322 06/13/22 0750  TSH 4.561*  --   FREET4  --  0.98   Anemia Panel: No results for input(s): "VITAMINB12", "FOLATE", "FERRITIN",  "TIBC", "IRON", "RETICCTPCT" in the last 72 hours. Sepsis Labs: No results for input(s): "PROCALCITON", "LATICACIDVEN" in the last 168 hours.  Recent Results (from the past 240 hour(s))  MRSA Next Gen by PCR, Nasal     Status: None   Collection Time: 06/10/22  8:29 PM   Specimen: Nasal Mucosa; Nasal Swab  Result Value Ref Range Status   MRSA by PCR Next Gen NOT DETECTED NOT DETECTED Final    Comment: (NOTE) The GeneXpert MRSA Assay (FDA approved for NASAL specimens only), is one component of a comprehensive MRSA colonization surveillance program. It is not intended to diagnose MRSA infection nor to guide or monitor treatment for MRSA infections. Test performance is not FDA approved in patients less than 51 years old. Performed at [facility], [address], [place], [place] [address]   Urine Culture (for pregnant, neutropenic or urologic patients or patients with an indwelling urinary catheter)     Status: Abnormal   Collection Time: 06/11/22  9:40 PM   Specimen: Urine, Clean Catch  Result Value Ref Range Status   Specimen Description URINE, CLEAN CATCH  Final   Special Requests NONE  Final   Culture >=100,000 COLONIES/mL ESCHERICHIA COLI (A)  Final   Report Status 06/14/2022 FINAL  Final   Organism ID, Bacteria ESCHERICHIA COLI (A)  Final      Susceptibility   Escherichia coli - MIC*    AMPICILLIN >=32 RESISTANT Resistant     CEFEPIME <=0.12 SENSITIVE Sensitive     CEFTRIAXONE <=0.25 SENSITIVE Sensitive     CIPROFLOXACIN >=4 RESISTANT Resistant     GENTAMICIN <=1 SENSITIVE Sensitive     IMIPENEM <=0.25 SENSITIVE Sensitive     NITROFURANTOIN <=16 SENSITIVE Sensitive     TRIMETH/SULFA >=320 RESISTANT Resistant     AMPICILLIN/SULBACTAM 4 SENSITIVE Sensitive     PIP/TAZO Value in next row Sensitive      <=4 SENSITIVEPerformed at [facility] [address] [place], [place] [address]    * >=100,000 COLONIES/mL ESCHERICHIA COLI  Culture, blood (Routine X 2) w Reflex to ID  Panel     Status: None (Preliminary result)   Collection Time: 06/12/22  8:28 AM   Specimen: BLOOD  Result Value Ref Range Status   Specimen Description BLOOD RIGHT HAND  Final   Special Requests   Final    BOTTLES DRAWN AEROBIC AND ANAEROBIC Blood Culture results may not be optimal due to an inadequate volume of blood received in culture bottles   Culture   Final    NO GROWTH 2 DAYS Performed at [facility], [address], [place], [place] [address]    Report Status PENDING  Incomplete         Radiology Studies: No results found.      Scheduled Meds:  Chlorhexidine Gluconate Cloth  6 each Topical Q0600   cloNIDine  0.2 mg Oral BID   folic acid  1 mg Oral Daily   heparin  5,000 Units Subcutaneous Q8H   LORazepam  0-4 mg Intravenous Q12H   metoprolol tartrate  50 mg Oral BID   multivitamin with minerals  1 tablet Oral Daily   nicotine  21 mg Transdermal Daily   pantoprazole (PROTONIX) IV  40 mg Intravenous Q12H   polyethylene glycol  17 g Oral Daily   thiamine  100 mg Oral Daily   Or   thiamine  100 mg Intravenous Daily   Continuous Infusions:  sodium chloride 150 mL/hr at 06/14/22 0643   cefTRIAXone (ROCEPHIN)  IV 200 mL/hr at 06/13/22 2133   niCARDipine Stopped (06/12/22 1107)     LOS: 3 days    Time spent: 35 minutes    Dwanna Goshert Darleen Crocker, DO [facility]  If 7PM-7AM, please contact night-coverage [email or web address] 06/14/2022, 10:57 AM

## 2022-06-18 NOTE — Progress Notes (Signed)
Mobility Specialist Progress Note:    06/18/22 1332  Mobility  Activity Refused mobility  Mobility Referral Yes   Pt refused mobility d/t just finished taking a shower. Pt states she has been ambulating independently in room, sitting in chair, and was able to take a shower by herself, no assistance needed. Left pt in bed with all needs met.   Royetta Crochet Mobility Specialist Please contact via Solicitor or  Rehab office at [phone]

## 2022-06-18 NOTE — Progress Notes (Signed)
Pharmacy Antibiotic Note  Veronica Calhoun is a 37 y.o. female admitted on 06/10/2022 with pneumonia.  Pharmacy has been consulted for cefepime dosing.  Patient currently afebrile, wbc trended up to 33. Blood cultures no growth, ecoli growing in urine cx.   Plan: Cefepime 2000 mg IV every 8 hours. Monitor labs, c/s, and patient improvement.  Height: 5' 6"$  (167.6 cm) Weight: 130.9 kg (288 lb 9.3 oz) IBW/kg (Calculated) : 59.3  Temp (24hrs), Avg:98.6 F (37 C), Min:98.4 F (36.9 C), Max:98.7 F (37.1 C)  Recent Labs  Lab 06/13/22 0524 06/14/22 0538 06/15/22 0519 06/16/22 0849 06/18/22 0436  WBC 19.8* 19.5* 21.2* 26.7* 33.2*  CREATININE 0.94 0.70 0.67 0.88 0.54     Estimated Creatinine Clearance: 137.5 mL/min (by C-G formula based on SCr of 0.54 mg/dL).    Allergies  Allergen Reactions   Lisinopril Swelling    Reports Lip swelling   Amlodipine Other (See Comments)    "like I was going to pass out"    Antimicrobials this admission: Cefepime 2/11 >> CTX 2/7 >> 2/10   Microbiology results: 2/8 BCx: ng 2/7 UCx: >100k e. coli  2/6 MRSA PCR: neg  Thank you for allowing pharmacy to be a part of this patient's care.  Erin Hearing PharmD., BCPS Clinical Pharmacist 06/18/2022 9:32 AM

## 2022-06-18 NOTE — Plan of Care (Signed)
  Problem: Education: Goal: Knowledge of General Education information will improve Description: Including pain rating scale, medication(s)/side effects and non-pharmacologic comfort measures Outcome: Progressing   Problem: Clinical Measurements: Goal: Ability to maintain clinical measurements within normal limits will improve Outcome: Progressing   

## 2022-06-18 NOTE — Progress Notes (Signed)
Subjective: Patient reports that she is having some issues with the nurses here today taking her pain seriously, feeling that her concerns are not being heard.   Still feeling that her belly is swollen. Unsure if distention is worse. Has had 1 BM this morning mostly diarrhea. She is passing flatus. Continues to have pain in her belly, mostly from distention. No nausea or vomiting. She is tolerating liquids okay. Did ambulate to the restroom but is not getting OOB often.   Objective: Vital signs in last 24 hours: Temp:  [98.4 F (36.9 C)-98.7 F (37.1 C)] 98.4 F (36.9 C) (02/14 0447) Pulse Rate:  [95-108] 98 (02/14 0447) Resp:  [18-26] 20 (02/14 0447) BP: (131-144)/(82-86) 135/82 (02/14 0447) SpO2:  [94 %-99 %] 97 % (02/14 0447) FiO2 (%):  [21 %-26 %] 21 % (02/14 0418) Last BM Date : 06/16/22 General:   Alert and oriented, pleasant Head:  Normocephalic and atraumatic. Eyes: scleral icterus  Mouth:  Without lesions, mucosa pink and moist.  Heart:  S1, S2 present, no murmurs noted.  Lungs: Clear to auscultation bilaterally, without wheezing, rales, or rhonchi.  Abdomen:  bowel sounds hypoactive. Abdomen is soft but distended, mild TTP of diffuse abdomen. No HSM or hernias noted. No rebound or guarding. No masses appreciated  Msk:  Symmetrical without gross deformities. Normal posture. Pulses:  Normal pulses noted. Extremities:  Without clubbing or edema. Neurologic:  Alert and  oriented x4;  grossly normal neurologically. Skin:  Warm and dry, intact without significant lesions.  Psych:  Alert and cooperative. Normal mood and affect.  Lab Results: Recent Labs    06/16/22 0849 06/18/22 0436  WBC 26.7* 33.2*  HGB 11.7* 11.4*  HCT 34.0* 33.5*  PLT 210 279   BMET Recent Labs    06/16/22 0849 06/18/22 0436  NA 134* 137  K 3.7 3.6  CL 107 111  CO2 19* 20*  GLUCOSE 94 96  BUN 16 16  CREATININE 0.88 0.54  CALCIUM 7.9* 8.0*   LFT Recent Labs    06/16/22 0849  06/17/22 1041 06/18/22 0436  PROT 6.1* 6.1* 6.1*  ALBUMIN 1.9* 1.8* 1.7*  AST 67* 72* 80*  ALT 32 35 37  ALKPHOS 113 109 113  BILITOT 9.7* 9.0* 9.3*  BILIDIR  --  5.9*  --   IBILI  --  3.1*  --    PT/INR Recent Labs    06/16/22 0849 06/17/22 1041  LABPROT 16.5* 16.0*  INR 1.4* 1.3*   Hepatitis Panel Recent Labs    06/16/22 0858  HEPBSAG NON REACTIVE  HCVAB NON REACTIVE  HEPAIGM NON REACTIVE  HEPBIGM NON REACTIVE   Assessment: Veronica Calhoun is a 37 year old female with a history of anxiety, HTN, alcohol abuse currently admitted with acute pancreatitis secondary to alcohol, possible pneumonia, AKI, emphysematous cystitis with E. coli UTI.  GI consulted for pancreatitis/Alcoholic hepatitis.  Pancreatitis: etiology likely secondary to ETOH as no evidence of gallstones in imaging, no high risk medications, TG 362, unlikely elevated enough to contribute. Tolerating clear liquids. No nausea or vomiting. Most abdominal pain appears secondary to distention from ileus. Continues to utilize PCA pain pump.   Ileus: abdominal distention worse per patient yesterday, repeat KUB yesterday with gaseous distention of the colon, consistent with ongoing ileus. Tolerating clear liquids and passing flatus. Liquid BM this morning. Abdomen remains distended. Counseled on importance of frequent ambulation and minimizing opiate pain med use.   Alcoholic Hepatitis: DF has remained too low for steroids.  DF today 24. Recent CT 06/15/2022 with stable hepatic steatosis without any biliary ductal dilation.  acute hep panel negative. Needs complete alcohol cessation. Will continue to calculate DF daily, though in presence of leukocytosis and other active infections, steroid initiation would likely be contraindicated even if her score rises.   Leukocytosis: WBC 8.9 on admission, trending up throughout hospitalization, now 33.2. likely multifactorial/reactive in setting of acute pancreatitis, pneumonia, E  coli UTI. She remains afebrile. Does not appear toxic.     Plan: HFP, INR daily (calculate DF) Complete ETOH cessation Miralax TID PPI BID Continue clear liquids Encourage frequent ambulation** Minimize opiate use    LOS: 7 days    06/18/2022, 9:04 AM   Hadyn Blanck L. Alver Sorrow, MSN, APRN, AGNP-C Adult-Gerontology Nurse Practitioner [facility]

## 2022-06-18 NOTE — Progress Notes (Signed)
   06/18/22 0000  Assess: MEWS Score  Resp (!) 26  SpO2 98 %  O2 Device Room Air  O2 Flow Rate (L/min) 0 L/min  FiO2 (%) 26 %  Assess: MEWS Score  MEWS Temp 0  MEWS Systolic 0  MEWS Pulse 1  MEWS RR 2  MEWS LOC 0  MEWS Score 3  MEWS Score Color Yellow  Assess: if the MEWS score is Yellow or Red  Were vital signs taken at a resting state? Yes  Focused Assessment No change from prior assessment  Does the patient meet 2 or more of the SIRS criteria? No  Does the patient have a confirmed or suspected source of infection? No  Provider and Rapid Response Notified? Yes  MEWS guidelines implemented  Yes, yellow  Treat  MEWS Interventions Considered administering scheduled or prn medications/treatments as ordered  Take Vital Signs  Increase Vital Sign Frequency  Yellow: Q2hr x1, continue Q4hrs until patient remains green for 12hrs  Escalate  MEWS: Escalate Yellow: Discuss with charge nurse and consider notifying provider and/or RRT  Notify: Charge Nurse/RN  Name of Charge Nurse/RN Notified Electrical engineer  Assess: SIRS CRITERIA  SIRS Temperature  0  SIRS Pulse 1  SIRS Respirations  1  SIRS WBC 0  SIRS Score Sum  2

## 2022-06-19 DIAGNOSIS — K7010 Alcoholic hepatitis without ascites: Secondary | ICD-10-CM

## 2022-06-19 DIAGNOSIS — K852 Alcohol induced acute pancreatitis without necrosis or infection: Secondary | ICD-10-CM | POA: Diagnosis not present

## 2022-06-19 HISTORY — DX: Alcoholic hepatitis without ascites: K70.10

## 2022-06-19 LAB — CBC
HCT: 31.3 % — ABNORMAL LOW (ref 36.0–46.0)
Hemoglobin: 10.9 g/dL — ABNORMAL LOW (ref 12.0–15.0)
MCH: 34.7 pg — ABNORMAL HIGH (ref 26.0–34.0)
MCHC: 34.8 g/dL (ref 30.0–36.0)
MCV: 99.7 fL (ref 80.0–100.0)
Platelets: 256 10*3/uL (ref 150–400)
RBC: 3.14 MIL/uL — ABNORMAL LOW (ref 3.87–5.11)
RDW: 16.3 % — ABNORMAL HIGH (ref 11.5–15.5)
WBC: 31.2 10*3/uL — ABNORMAL HIGH (ref 4.0–10.5)
nRBC: 0.1 % (ref 0.0–0.2)

## 2022-06-19 LAB — COMPREHENSIVE METABOLIC PANEL
ALT: 38 U/L (ref 0–44)
AST: 83 U/L — ABNORMAL HIGH (ref 15–41)
Albumin: 1.7 g/dL — ABNORMAL LOW (ref 3.5–5.0)
Alkaline Phosphatase: 113 U/L (ref 38–126)
Anion gap: 8 (ref 5–15)
BUN: 13 mg/dL (ref 6–20)
CO2: 17 mmol/L — ABNORMAL LOW (ref 22–32)
Calcium: 7.7 mg/dL — ABNORMAL LOW (ref 8.9–10.3)
Chloride: 109 mmol/L (ref 98–111)
Creatinine, Ser: 0.49 mg/dL (ref 0.44–1.00)
GFR, Estimated: >60 mL/min (ref >60)
Glucose, Bld: 107 mg/dL — ABNORMAL HIGH (ref 70–99)
Potassium: 3.7 mmol/L (ref 3.5–5.1)
Sodium: 134 mmol/L — ABNORMAL LOW (ref 135–145)
Total Bilirubin: 8.1 mg/dL — ABNORMAL HIGH (ref 0.3–1.2)
Total Protein: 6.2 g/dL — ABNORMAL LOW (ref 6.5–8.1)

## 2022-06-19 LAB — MAGNESIUM: Magnesium: 2 mg/dL (ref 1.7–2.4)

## 2022-06-19 MED ORDER — DIPHENHYDRAMINE HCL 50 MG/ML IJ SOLN: 12.5000 mg | Freq: Four times a day (QID) | INTRAMUSCULAR | Status: DC | PRN

## 2022-06-19 MED ORDER — ONDANSETRON HCL 4 MG/2ML IJ SOLN: 4.0000 mg | Freq: Four times a day (QID) | INTRAMUSCULAR | Status: DC | PRN

## 2022-06-19 MED ORDER — NALOXONE HCL 0.4 MG/ML IJ SOLN: 0.4000 mg | INTRAMUSCULAR | Status: DC | PRN

## 2022-06-19 MED ORDER — HYDROMORPHONE 1 MG/ML IV SOLN: INTRAVENOUS | Status: DC

## 2022-06-19 MED ORDER — HYDROMORPHONE HCL 1 MG/ML IJ SOLN
0.5000 mg | INTRAMUSCULAR | Status: DC | PRN
  Administered 2022-06-19 – 2022-06-21 (×23): 0.5 mg via INTRAVENOUS
  Filled 2022-06-19 (×23): qty 0.5

## 2022-06-19 MED ORDER — FUROSEMIDE 10 MG/ML IJ SOLN
40.0000 mg | Freq: Every day | INTRAMUSCULAR | Status: AC
  Administered 2022-06-19 – 2022-06-20 (×2): 40 mg via INTRAVENOUS
  Filled 2022-06-19 (×2): qty 4

## 2022-06-19 MED ORDER — DIPHENHYDRAMINE HCL 12.5 MG/5ML PO ELIX: 12.5000 mg | ORAL_SOLUTION | Freq: Four times a day (QID) | ORAL | Status: DC | PRN

## 2022-06-19 MED ORDER — SODIUM CHLORIDE 0.9% FLUSH: 9.0000 mL | INTRAVENOUS | Status: DC | PRN

## 2022-06-19 NOTE — Progress Notes (Signed)
PROGRESS NOTE    Veronica Calhoun  [id number] DOB: Sep 06, 1985 DOA: 06/10/2022 PCP: Pcp, No   Brief Narrative:    Veronica Calhoun is a 38 y.o. female with medical history significant of alcohol dependence, tobacco use disorder, hypertension-not on medication, generalized anxiety disorder-not on medication, and more presents the ED with a chief complaint of abdominal pain.  She does have daily alcohol use and was admitted with alcohol induced acute pancreatitis.  She is also noted to have hypertensive crisis and is now requiring Cardizem drip.  She continues to have some ongoing sinus tachycardia.  It appears that she has been noncompliant with her home blood pressure medications.  CT abdomen and pelvis performed 2/7 with findings of severe pancreatitis and emphysematous cystitis.  Started on Rocephin with urine cultures showing E Coli with sensitivities pending.  She still remains tachycardic and D-dimer noted to be elevated.  She underwent further imaging on 2/11 with CT angiogram of the chest with findings of multilobar pneumonia and no PE.  CT of the abdomen demonstrating ongoing moderate to severe pancreatitis with no other acute complications.  GI following and assisting with management.  Assessment & Plan:   Principal Problem:   Alcohol induced acute pancreatitis Active Problems:   Anxiety   Alcohol use disorder, severe, dependence (HCC)   Acute pancreatitis   Hypertensive crisis   Transaminitis   Tobacco use disorder   Tachycardia   Hypokalemia  Assessment and Plan:   Alcohol induced acute severe pancreatitis -Appears to be a recurrent problem in the setting of her alcohol use drinks daily -Abdominal pain, transaminitis, elevated lipase -Abd US shows normal size for common bile duct and normal gall bladder -Patient is not on any medications that would be likely to cause pancreatitis -Advised on the importance of EtOH cessation -Continue clear liquids -Continue  IVF -Pain control with dilaudid PCA being weaned -Repeat CT 2/11 with moderate to severe pancreatitis with ongoing symptoms, appreciate GI evaluation -Continue to monitor with a.m. labs   Sinus tachycardia improved -Okay for transfer to telemetry today - Related to volume depletion in the setting of above, continue aggressive IV fluid -Also related to pain and some alcohol withdrawal -Patient also has not been taking her home metoprolol which has been resumed -Continue to monitor closely in the ICU -TSH elevated at 4.561, free T4 within normal limits -Increased metoprolol to 50 mg twice daily 2/9 -D-dimer elevated and PE study negative for PE -Improved with better pain control on Dilaudid PCA  Multilobar pneumonia possibly HCAP -Started on cefepime and discontinue Rocephin  Worsening leukocytosis Likely related to above factors, continue to monitor  AKI-resolving Monitor strict I's and O's Avoid nephrotoxic agents Continue to follow labs and continue aggressive IV fluid Lasix x 2 doses as ordered per GI  Emphysematous cystitis with E. coli UTI -Treat empirically with Cefepime as above, ongoing   Tobacco use disorder - Smokes 1 pack/day - Nicotine patch ordered - Continue to monitor   Transaminitis-downtrending -related to EtOH and pancreatits -US shows normal CBD  - not likely to have an obstructing stone -If transaminases continue to rise on tomorrow's BMP consider MRCP -Hold hepatotoxic agents when possible -Continue to monitor   Hypertensive crisis-resolved -Noted to previously be on home clonidine, metoprolol, and HCTZ -Resume on metoprolol and clonidine for now and HCTZ later -Increased metoprolol with noted ongoing sinus tachycardia to 50 mg twice daily   Alcohol use disorder, severe, dependence ([place]) -CIWA protocol -Advised on importance of cessation  Anxiety -Ativan given in the ED -Continue ativan per CIWA protocol -Continue to monitor  Obesity BMI  42.02    DVT prophylaxis:Heparin Code Status: Full Family Communication: Sister at bedside 2/13 Disposition Plan:  Status is: Inpatient Remains inpatient appropriate because: Need for IV fluid   Consultants:  GI  Procedures:  See below  Antimicrobials:  Anti-infectives (From admission, onward)    Start     Dose/Rate Route Frequency Ordered Stop   06/11/22 2145  cefTRIAXone (ROCEPHIN) 1 g in sodium chloride 0.9 % 100 mL IVPB        1 g 200 mL/hr over 30 Minutes Intravenous Every 24 hours 06/11/22 2053         Subjective: Patient seen and evaluated today with improving abdominal pain noted. No n/v. She has been having Bms. Prefers to stick to clear liquid diet for now.  Objective: Vitals:   06/14/22 0031 06/14/22 0106 06/14/22 0532 06/14/22 0750  BP:  110/71 (!) 142/95 (!) 151/92  Pulse: (!) 113 (!) 109 (!) 110 (!) 118  Resp:  (!) 30 (!) 30 (!) 21  Temp:  99 F (37.2 C) 98.1 F (36.7 C)   TempSrc:  Oral    SpO2:  95% 96% 96%  Weight:      Height:        Intake/Output Summary (Last 24 hours) at 06/14/2022 1057 Last data filed at 06/14/2022 0900 Gross per 24 hour  Intake 3701.24 ml  Output --  Net 3701.24 ml   Filed Weights   06/10/22 2119 06/11/22 0500 06/13/22 2207  Weight: 118.1 kg 118.1 kg 130.9 kg    Examination:  General exam: Appears calm and comfortable, obese Respiratory system: Clear to auscultation. Respiratory effort normal. Cardiovascular system: S1 & S2 heard, RRR.  Tachycardic Gastrointestinal system: Abdomen is tender to palpation and distended Central nervous system: Alert and awake Extremities: No edema Skin: No significant lesions noted Psychiatry: Flat affect.    Data Reviewed: I have personally reviewed following labs and imaging studies  CBC: Recent Labs  Lab 06/10/22 1341 06/11/22 0350 06/12/22 0446 06/13/22 0524 06/14/22 0538  WBC 8.9 15.7* 22.1* 19.8* 19.5*  NEUTROABS  --  12.9*  --   --   --   HGB 16.1* 18.0* 15.5*  14.4 12.8  HCT 45.2 52.0* 45.6 41.8 37.0  MCV 97.4 100.2* 103.9* 102.0* 101.1*  PLT 263 208 112* 102* [id number]*   Basic Metabolic Panel: Recent Labs  Lab 06/10/22 1341 06/11/22 0350 06/12/22 0446 06/13/22 0524 06/14/22 0538  NA 133* 132* 133* 131* 132*  K 3.3* 4.5 4.5 4.4 4.0  CL 100 101 105 108 105  CO2 19* 18* 20* 16* 18*  GLUCOSE 209* 221* 154* 82 68*  BUN <5* 8 18 27* 16  CREATININE 0.54 0.85 1.06* 0.94 0.70  CALCIUM 8.4* 8.2* 6.8* 6.7* 7.3*  MG  --  1.6* 1.9 1.8 2.0   GFR: Estimated Creatinine Clearance: 137.5 mL/min (by C-G formula based on SCr of 0.7 mg/dL). Liver Function Tests: Recent Labs  Lab 06/10/22 1341 06/11/22 0350 06/12/22 0446 06/13/22 0524 06/14/22 0538  AST 308* 142* 65* 59* 56*  ALT 115* 83* 45* 36 32  ALKPHOS 169* 133* 100 95 94  BILITOT 1.2 1.2 2.6* 4.5* 7.6*  PROT 8.2* 7.5 5.9* 5.9* 6.0*  ALBUMIN 3.5 3.1* 2.3* 2.2* 2.0*   Recent Labs  Lab 06/10/22 1341 06/10/22 2055  LIPASE 965* 637*   No results for input(s): "AMMONIA" in the last 168 hours. Coagulation  Profile: No results for input(s): "INR", "PROTIME" in the last 168 hours. Cardiac Enzymes: No results for input(s): "CKTOTAL", "CKMB", "CKMBINDEX", "TROPONINI" in the last 168 hours. BNP (last 3 results) No results for input(s): "PROBNP" in the last 8760 hours. HbA1C: No results for input(s): "HGBA1C" in the last 72 hours. CBG: No results for input(s): "GLUCAP" in the last 168 hours. Lipid Profile: No results for input(s): "CHOL", "HDL", "LDLCALC", "TRIG", "CHOLHDL", "LDLDIRECT" in the last 72 hours. Thyroid Function Tests: Recent Labs    06/12/22 1322 06/13/22 0750  TSH 4.561*  --   FREET4  --  0.98   Anemia Panel: No results for input(s): "VITAMINB12", "FOLATE", "FERRITIN", "TIBC", "IRON", "RETICCTPCT" in the last 72 hours. Sepsis Labs: No results for input(s): "PROCALCITON", "LATICACIDVEN" in the last 168 hours.  Recent Results (from the past 240 hour(s))  MRSA Next Gen by  PCR, Nasal     Status: None   Collection Time: 06/10/22  8:29 PM   Specimen: Nasal Mucosa; Nasal Swab  Result Value Ref Range Status   MRSA by PCR Next Gen NOT DETECTED NOT DETECTED Final    Comment: (NOTE) The GeneXpert MRSA Assay (FDA approved for NASAL specimens only), is one component of a comprehensive MRSA colonization surveillance program. It is not intended to diagnose MRSA infection nor to guide or monitor treatment for MRSA infections. Test performance is not FDA approved in patients less than 76 years old. Performed at [facility], [address] [place], [place] [address]   Urine Culture (for pregnant, neutropenic or urologic patients or patients with an indwelling urinary catheter)     Status: Abnormal   Collection Time: 06/11/22  9:40 PM   Specimen: Urine, Clean Catch  Result Value Ref Range Status   Specimen Description URINE, CLEAN CATCH  Final   Special Requests NONE  Final   Culture >=100,000 COLONIES/mL ESCHERICHIA COLI (A)  Final   Report Status 06/14/2022 FINAL  Final   Organism ID, Bacteria ESCHERICHIA COLI (A)  Final      Susceptibility   Escherichia coli - MIC*    AMPICILLIN >=32 RESISTANT Resistant     CEFEPIME <=0.12 SENSITIVE Sensitive     CEFTRIAXONE <=0.25 SENSITIVE Sensitive     CIPROFLOXACIN >=4 RESISTANT Resistant     GENTAMICIN <=1 SENSITIVE Sensitive     IMIPENEM <=0.25 SENSITIVE Sensitive     NITROFURANTOIN <=16 SENSITIVE Sensitive     TRIMETH/SULFA >=320 RESISTANT Resistant     AMPICILLIN/SULBACTAM 4 SENSITIVE Sensitive     PIP/TAZO Value in next row Sensitive      <=4 SENSITIVEPerformed at [facility] [address], [place], [place] [address]    * >=100,000 COLONIES/mL ESCHERICHIA COLI  Culture, blood (Routine X 2) w Reflex to ID Panel     Status: None (Preliminary result)   Collection Time: 06/12/22  8:28 AM   Specimen: BLOOD  Result Value Ref Range Status   Specimen Description BLOOD RIGHT HAND  Final   Special Requests   Final     BOTTLES DRAWN AEROBIC AND ANAEROBIC Blood Culture results may not be optimal due to an inadequate volume of blood received in culture bottles   Culture   Final    NO GROWTH 2 DAYS Performed at [facility], [address], [place], [place] [address]    Report Status PENDING  Incomplete         Radiology Studies: No results found.      Scheduled Meds:  Chlorhexidine Gluconate Cloth  6 each Topical Q0600   cloNIDine  0.2 mg Oral BID   folic acid  1 mg Oral Daily   heparin  5,000 Units Subcutaneous Q8H   LORazepam  0-4 mg Intravenous Q12H   metoprolol tartrate  50 mg Oral BID   multivitamin with minerals  1 tablet Oral Daily   nicotine  21 mg Transdermal Daily   pantoprazole (PROTONIX) IV  40 mg Intravenous Q12H   polyethylene glycol  17 g Oral Daily   thiamine  100 mg Oral Daily   Or   thiamine  100 mg Intravenous Daily   Continuous Infusions:  sodium chloride 150 mL/hr at 06/14/22 0643   cefTRIAXone (ROCEPHIN)  IV 200 mL/hr at 06/13/22 2133   niCARDipine Stopped (06/12/22 1107)     LOS: 3 days    Time spent: 35 minutes    Shima Compere Darleen Crocker, DO [facility]  If 7PM-7AM, please contact night-coverage [email or web address] 06/14/2022, 10:57 AM

## 2022-06-19 NOTE — Progress Notes (Signed)
Gastroenterology Progress Note   Referring Provider: No ref. provider found Primary Care Physician:  Pcp, No Primary Gastroenterologist:  Garfield Cornea, MD   Patient ID: Veronica Calhoun; [place]:[id number]; Sep 11, 1985   Subjective:    Feels better today. States she have 2-3 stools yesterday and one today. Ambulating some in the room and sitting up in chair 20 minutes at a time. Tolerating clear liquids with no desires to advance diet. No N/V. Feels like her pain is adequately managed. We discussed her labs and progress today. She continues to have significant edema, states she was not taking her furosemide at home.    Objective:   Vital signs in last 24 hours: Temp:  [98.2 F (36.8 C)-99 F (37.2 C)] 98.4 F (36.9 C) (02/15 0414) Pulse Rate:  [74-110] 74 (02/15 0734) Resp:  [16-26] 16 (02/15 0734) BP: (128-170)/(79-87) 133/83 (02/15 0414) SpO2:  [96 %-100 %] 98 % (02/15 0734) FiO2 (%):  [21 %] 21 % (02/14 1556) Last BM Date : 06/18/22 General:   Alert, pleasant and cooperative in NAD Head:  Normocephalic and atraumatic. Eyes:  Sclera clear, + icterus.  Abdomen:  Soft, distended with pitting edema. Mild diffuse tenderness. Normal bowel sounds, without guarding, and without rebound.   Extremities:  1-2+ pitting edema to thighs bilaterally. Neurologic:  Alert and  oriented x4;  grossly normal neurologically. Skin:  Intact without significant lesions or rashes. Psych:  Alert and cooperative. Normal mood and affect.  Intake/Output from previous day: No intake/output data recorded. Intake/Output this shift: No intake/output data recorded.  Lab Results: CBC Recent Labs    06/18/22 0436 06/19/22 0753  WBC 33.2* 31.2*  HGB 11.4* 10.9*  HCT 33.5* 31.3*  MCV 101.2* 99.7  PLT 279 256   BMET Recent Labs    06/18/22 0436 06/19/22 0753  NA 137 134*  K 3.6 3.7  CL 111 109  CO2 20* 17*  GLUCOSE 96 107*  BUN 16 13  CREATININE 0.54 0.49  CALCIUM 8.0* 7.7*   LFTs Recent  Labs    06/17/22 1041 06/18/22 0436 06/19/22 0753  BILITOT 9.0* 9.3* 8.1*  BILIDIR 5.9*  --   --   IBILI 3.1*  --   --   ALKPHOS 109 113 113  AST 72* 80* 83*  ALT 35 37 38  PROT 6.1* 6.1* 6.2*  ALBUMIN 1.8* 1.7* 1.7*   No results for input(s): "LIPASE" in the last 72 hours. PT/INR Recent Labs    06/17/22 1041 06/18/22 0931  LABPROT 16.0* 16.2*  INR 1.3* 1.3*         Imaging Studies: DG Abd 1 View  Result Date: 06/17/2022 CLINICAL DATA:  Abdominal distention EXAM: ABDOMEN - 1 VIEW COMPARISON:  None Available. FINDINGS: Gaseous distention of the colon. Scattered nondilated gas-filled loops of small bowel. No radio-opaque calculi or other significant radiographic abnormality are seen. IMPRESSION: Gaseous distention of the colon, likely due to ileus. Electronically Signed   By: Yetta Glassman M.D.   On: 06/17/2022 13:27   CT Angio Chest Pulmonary Embolism (PE) W or WO Contrast  Result Date: 06/15/2022 CLINICAL DATA:  Tachycardia. Elevated D-dimer. Alcohol-induced acute pancreatitis. EXAM: CT ANGIOGRAPHY CHEST CT ABDOMEN AND PELVIS WITH CONTRAST TECHNIQUE: Multidetector CT imaging of the chest was performed using the standard protocol during bolus administration of intravenous contrast. Multiplanar CT image reconstructions and MIPs were obtained to evaluate the vascular anatomy. Multidetector CT imaging of the abdomen and pelvis was performed using the standard protocol during bolus administration  of intravenous contrast. RADIATION DOSE REDUCTION: This exam was performed according to the departmental dose-optimization program which includes automated exposure control, adjustment of the mA and/or kV according to patient size and/or use of iterative reconstruction technique. CONTRAST:  169m OMNIPAQUE IOHEXOL 350 MG/ML SOLN COMPARISON:  AP CT on 06/11/2022 FINDINGS: CTA CHEST FINDINGS Cardiovascular: Satisfactory opacification of pulmonary arteries noted, and no pulmonary emboli  identified. No evidence of thoracic aortic dissection or aneurysm. Mediastinum/Nodes: No masses or pathologically enlarged lymph nodes identified. Lungs/Pleura: Airspace disease is seen in both upper lobes and right middle lobe, suspicious for pneumonia. Left lower lobe atelectasis and tiny left pleural effusion noted. Musculoskeletal: No suspicious bone lesions identified. Review of the MIP images confirms the above findings. CT ABDOMEN and PELVIS FINDINGS Hepatobiliary: No hepatic masses identified. Moderate diffuse hepatic steatosis again noted. Gallbladder is unremarkable. No evidence of biliary ductal dilatation. Pancreas: Moderate to severe acute pancreatitis shows no significant change. No evidence of pancreatic necrosis or mass. Evidence of pancreatic ductal dilatation. Moderate peripancreatic fluid is seen which tracts inferiorly into the pelvis. This shows mild decrease since previous study. No pseudocysts identified. Spleen: Within normal limits in size and appearance. Adrenals/Urinary Tract: No suspicious masses identified. No evidence of ureteral calculi or hydronephrosis. Stomach/Bowel: Reactive wall thickening is again seen involving the stomach secondary to acute pancreatitis. Increased diffuse colonic dilatation is seen, consistent with ileus. Vascular/Lymphatic: No pathologically enlarged lymph nodes. No acute vascular findings. Reproductive:  No mass or other significant abnormality. Other:  Increased diffuse body wall edema. Musculoskeletal:  No suspicious bone lesions identified. Review of the MIP images confirms the above findings. IMPRESSION: No evidence of pulmonary embolism. Airspace disease in both upper lobes and right middle lobe, suspicious for pneumonia. Left lower lobe atelectasis and tiny left pleural effusion. Moderate to severe acute pancreatitis, with mild decrease in peripancreatic fluid and ascites. No evidence of pancreatic necrosis or pseudocysts. Worsening colonic ileus.  Increased diffuse body wall edema. Stable hepatic steatosis. Electronically Signed   By: JMarlaine HindM.D.   On: 06/15/2022 13:19   CT ABDOMEN PELVIS W CONTRAST  Result Date: 06/15/2022 CLINICAL DATA:  Tachycardia. Elevated D-dimer. Alcohol-induced acute pancreatitis. EXAM: CT ANGIOGRAPHY CHEST CT ABDOMEN AND PELVIS WITH CONTRAST TECHNIQUE: Multidetector CT imaging of the chest was performed using the standard protocol during bolus administration of intravenous contrast. Multiplanar CT image reconstructions and MIPs were obtained to evaluate the vascular anatomy. Multidetector CT imaging of the abdomen and pelvis was performed using the standard protocol during bolus administration of intravenous contrast. RADIATION DOSE REDUCTION: This exam was performed according to the departmental dose-optimization program which includes automated exposure control, adjustment of the mA and/or kV according to patient size and/or use of iterative reconstruction technique. CONTRAST:  1040mOMNIPAQUE IOHEXOL 350 MG/ML SOLN COMPARISON:  AP CT on 06/11/2022 FINDINGS: CTA CHEST FINDINGS Cardiovascular: Satisfactory opacification of pulmonary arteries noted, and no pulmonary emboli identified. No evidence of thoracic aortic dissection or aneurysm. Mediastinum/Nodes: No masses or pathologically enlarged lymph nodes identified. Lungs/Pleura: Airspace disease is seen in both upper lobes and right middle lobe, suspicious for pneumonia. Left lower lobe atelectasis and tiny left pleural effusion noted. Musculoskeletal: No suspicious bone lesions identified. Review of the MIP images confirms the above findings. CT ABDOMEN and PELVIS FINDINGS Hepatobiliary: No hepatic masses identified. Moderate diffuse hepatic steatosis again noted. Gallbladder is unremarkable. No evidence of biliary ductal dilatation. Pancreas: Moderate to severe acute pancreatitis shows no significant change. No evidence of pancreatic necrosis or mass. Evidence  of  pancreatic ductal dilatation. Moderate peripancreatic fluid is seen which tracts inferiorly into the pelvis. This shows mild decrease since previous study. No pseudocysts identified. Spleen: Within normal limits in size and appearance. Adrenals/Urinary Tract: No suspicious masses identified. No evidence of ureteral calculi or hydronephrosis. Stomach/Bowel: Reactive wall thickening is again seen involving the stomach secondary to acute pancreatitis. Increased diffuse colonic dilatation is seen, consistent with ileus. Vascular/Lymphatic: No pathologically enlarged lymph nodes. No acute vascular findings. Reproductive:  No mass or other significant abnormality. Other:  Increased diffuse body wall edema. Musculoskeletal:  No suspicious bone lesions identified. Review of the MIP images confirms the above findings. IMPRESSION: No evidence of pulmonary embolism. Airspace disease in both upper lobes and right middle lobe, suspicious for pneumonia. Left lower lobe atelectasis and tiny left pleural effusion. Moderate to severe acute pancreatitis, with mild decrease in peripancreatic fluid and ascites. No evidence of pancreatic necrosis or pseudocysts. Worsening colonic ileus. Increased diffuse body wall edema. Stable hepatic steatosis. Electronically Signed   By: Marlaine Hind M.D.   On: 06/15/2022 13:19   CT ABDOMEN PELVIS W CONTRAST  Result Date: 06/11/2022 CLINICAL DATA:  Pancreatitis, abdominal pain, alcohol abuse EXAM: CT ABDOMEN AND PELVIS WITH CONTRAST TECHNIQUE: Multidetector CT imaging of the abdomen and pelvis was performed using the standard protocol following bolus administration of intravenous contrast. RADIATION DOSE REDUCTION: This exam was performed according to the departmental dose-optimization program which includes automated exposure control, adjustment of the mA and/or kV according to patient size and/or use of iterative reconstruction technique. CONTRAST:  13m OMNIPAQUE IOHEXOL 300 MG/ML  SOLN  COMPARISON:  06/10/2022, 12/28/2021 FINDINGS: Lower chest: Hypoventilatory changes at the lung bases. Trace left pleural effusion. Hepatobiliary: Hepatic steatosis. Stable hepatomegaly. No biliary duct dilation. The gallbladder is unremarkable. Pancreas: Heterogeneous decreased enhancement within the head and uncinate process of the pancreas consistent with interstitial edema and underlying pancreatitis. There is marked peripancreatic fat stranding and free fluid. No organized fluid collection, abscess, or pseudocyst at this time. No pancreatic duct dilation. Spleen: Normal in size without focal abnormality. Adrenals/Urinary Tract: Punctate less than 2 mm nonobstructing left renal calculus. No obstructive uropathy within either kidney. The adrenals are grossly normal. The bladder is moderately distended, with intramural gas seen throughout the dependent portion of the gallbladder concerning for emphysematous cystitis. A small amount of intraluminal gas within the bladder may reflect recent catheterization. Please correlate with urinalysis. Stomach/Bowel: No bowel obstruction or ileus. There is secondary wall thickening of the duodenum adjacent to the acute pancreatitis described above. No other wall thickening. Vascular/Lymphatic: No significant vascular findings are present. No enlarged abdominal or pelvic lymph nodes. Reproductive: Uterus and bilateral adnexa are unremarkable. Other: Small volume ascites throughout the lower abdomen and pelvis. No free intraperitoneal gas. No abdominal wall hernia. Musculoskeletal: No acute or destructive bony lesions. Reconstructed images demonstrate no additional findings. IMPRESSION: 1. Severe acute pancreatitis, with edematous changes throughout the head and uncinate process of the pancreas. No evidence of necrosis, fluid collection, abscess, or pseudocyst at this time. 2. Intramural gas within the posterior bladder wall, compatible with emphysematous cystitis. Please  correlate with urinalysis. 3. Lower abdominal and pelvic ascites. 4. Trace left pleural effusion. 5. Stable hepatic steatosis. 6. Punctate less than 2 mm nonobstructing left renal calculus. These results will be called to the ordering clinician or representative by the Radiologist Assistant, and communication documented in the PACS or [employer] Electronically Signed   By: MRanda NgoM.D.   On: 06/11/2022 20:29  US Abdomen Complete  Result Date: 06/10/2022 CLINICAL DATA:  Epigastric abdominal pain since this morning. EXAM: ABDOMEN ULTRASOUND COMPLETE COMPARISON:  CT scan 12/28/2021 FINDINGS: Gallbladder: No gallstones or wall thickening visualized. No sonographic Murphy sign noted by sonographer. Common bile duct: Diameter: 5.0 mm Liver: There is diffuse increased echogenicity of the liver and decreased through transmission consistent with fatty infiltration. No focal lesions or biliary dilatation. Portal vein is patent on color Doppler imaging with normal direction of blood flow towards the liver. IVC: Normal caliber Pancreas: Visualized portion unremarkable. Spleen: Not well visualize. Right Kidney: Length: 13.4 cm. Normal renal cortical thickness and echogenicity without focal lesions or hydronephrosis. Left Kidney: Length: 11.4 cm. Normal renal cortical thickness and echogenicity without focal lesions or hydronephrosis. Abdominal aorta: Poorly visualized. Other findings: No ascites IMPRESSION: 1. Diffuse fatty infiltration liver but no hepatic lesions or intrahepatic biliary dilatation. 2. Normal gallbladder. 3. Poorly visualized pancreas, spleen and aorta. 4. Normal kidneys.  No ascites. Electronically Signed   By: Marijo Sanes M.D.   On: 06/10/2022 16:46  [2 weeks]  Assessment:   Nishka S. Hinesley is a 37 year old female with a history of anxiety, HTN, alcohol abuse currently admitted with acute pancreatitis secondary to alcohol, possible pneumonia, AKI, emphysematous cystitis with E. coli  UTI.  GI consulted for pancreatitis/Alcoholic hepatitis.   Pancreatitis: likely secondary to etoh due to lack of evidence of other contributing causes. Tolerating clear liquids. Abdominal pain improved. She does not want to advance diet at this time. She received 5.9m of dilaudid overnight. Encouraged further weaning off pain medication.   Ileus:  normal bowel sounds. She reports had several BMs in the last 24 hours. None recorded in the last 24 hours.  Alcoholic hepatitis: has not require steroids, due to low DF. Recent CT 06/15/22 with stable hepatic steatosis without biliary ductal dilation. Acute Hepatitis panel negative. Slight improvement in Tbili today. Recalculate DF tomorrow.   Leukocytosis: WBC 8,900 on admission. Highest of 33,200 yesterday. Down to 31,200 today. Remains on cefepime.  Anasarca: albumin 1.7. pitting edema in abdominal area, LE to the thighs.     Plan:   CBC, CMET, PT/INR tomorrow. Continue miralax TID. PPI BID. Taper opiate use.  Ambulation encouraged.  Patient prefers clear liquids.  Lasix 452mIV today/tomorrow.    LOS: 8 days   LeLaureen OchsLeBernarda CaffeyoJfk [facility] [id number] AM

## 2022-06-19 NOTE — Progress Notes (Addendum)
Patient received a total of 5.58m of dilaudid this shift from PCA pump. Pain rating 6-8. Patient had a bowel movement and ambulated to the restroom multiple times this shift. Respirations increased with exertion and along with shortness of breat noted. Her respirations jumped between 20-26 this shift when ambulating along with heart rate staying in the 95-110 range. Patient did show a Yellow mews due to heart rate and respirations from PCA documentation, patient has also just ambulated back from restroom. Vitals rechecked and mews returned to green. Continued to monitor patient.

## 2022-06-19 NOTE — Progress Notes (Signed)
PCA pump discontinued. This nurse and another RN Caryl Pina C wasted 44m of Dilaudid from pump.

## 2022-06-20 DIAGNOSIS — R601 Generalized edema: Secondary | ICD-10-CM

## 2022-06-20 DIAGNOSIS — K7010 Alcoholic hepatitis without ascites: Secondary | ICD-10-CM | POA: Diagnosis not present

## 2022-06-20 DIAGNOSIS — K852 Alcohol induced acute pancreatitis without necrosis or infection: Secondary | ICD-10-CM | POA: Diagnosis not present

## 2022-06-20 HISTORY — DX: Generalized edema: R60.1

## 2022-06-20 LAB — COMPREHENSIVE METABOLIC PANEL
ALT: 45 U/L — ABNORMAL HIGH (ref 0–44)
AST: 105 U/L — ABNORMAL HIGH (ref 15–41)
Albumin: 1.6 g/dL — ABNORMAL LOW (ref 3.5–5.0)
Alkaline Phosphatase: 115 U/L (ref 38–126)
Anion gap: 8 (ref 5–15)
BUN: 12 mg/dL (ref 6–20)
CO2: 19 mmol/L — ABNORMAL LOW (ref 22–32)
Calcium: 7.8 mg/dL — ABNORMAL LOW (ref 8.9–10.3)
Chloride: 108 mmol/L (ref 98–111)
Creatinine, Ser: 0.48 mg/dL (ref 0.44–1.00)
GFR, Estimated: >60 mL/min (ref >60)
Glucose, Bld: 112 mg/dL — ABNORMAL HIGH (ref 70–99)
Potassium: 3.1 mmol/L — ABNORMAL LOW (ref 3.5–5.1)
Sodium: 135 mmol/L (ref 135–145)
Total Bilirubin: 7.7 mg/dL — ABNORMAL HIGH (ref 0.3–1.2)
Total Protein: 5.9 g/dL — ABNORMAL LOW (ref 6.5–8.1)

## 2022-06-20 LAB — CBC WITH DIFFERENTIAL/PLATELET
Abs Immature Granulocytes: 1.9 10*3/uL — ABNORMAL HIGH (ref 0.00–0.07)
Basophils Absolute: 0 10*3/uL (ref 0.0–0.1)
Basophils Relative: 0 %
Eosinophils Absolute: 0.6 10*3/uL — ABNORMAL HIGH (ref 0.0–0.5)
Eosinophils Relative: 2 %
HCT: 31.8 % — ABNORMAL LOW (ref 36.0–46.0)
Hemoglobin: 11.1 g/dL — ABNORMAL LOW (ref 12.0–15.0)
Lymphocytes Relative: 16 %
Lymphs Abs: 4.4 10*3/uL — ABNORMAL HIGH (ref 0.7–4.0)
MCH: 35 pg — ABNORMAL HIGH (ref 26.0–34.0)
MCHC: 34.9 g/dL (ref 30.0–36.0)
MCV: 100.3 fL — ABNORMAL HIGH (ref 80.0–100.0)
Metamyelocytes Relative: 3 %
Monocytes Absolute: 0.8 10*3/uL (ref 0.1–1.0)
Monocytes Relative: 3 %
Myelocytes: 4 %
Neutro Abs: 19.8 10*3/uL — ABNORMAL HIGH (ref 1.7–7.7)
Neutrophils Relative %: 72 %
Platelets: 270 10*3/uL (ref 150–400)
RBC: 3.17 MIL/uL — ABNORMAL LOW (ref 3.87–5.11)
RDW: 16.1 % — ABNORMAL HIGH (ref 11.5–15.5)
WBC: 27.5 10*3/uL — ABNORMAL HIGH (ref 4.0–10.5)
nRBC: 0.1 % (ref 0.0–0.2)

## 2022-06-20 LAB — PROTIME-INR
INR: 1.3 — ABNORMAL HIGH (ref 0.8–1.2)
Prothrombin Time: 16.3 seconds — ABNORMAL HIGH (ref 11.4–15.2)

## 2022-06-20 MED ORDER — SODIUM CHLORIDE 0.9 % IV SOLN
2.0000 g | Freq: Three times a day (TID) | INTRAVENOUS | Status: AC
  Administered 2022-06-20 – 2022-06-21 (×5): 2 g via INTRAVENOUS
  Filled 2022-06-20 (×5): qty 12.5

## 2022-06-20 MED ORDER — MAGNESIUM SULFATE 2 GM/50ML IV SOLN
2.0000 g | Freq: Once | INTRAVENOUS | Status: AC
  Administered 2022-06-20: 2 g via INTRAVENOUS
  Filled 2022-06-20: qty 50

## 2022-06-20 MED ORDER — POTASSIUM CHLORIDE CRYS ER 20 MEQ PO TBCR
40.0000 meq | EXTENDED_RELEASE_TABLET | Freq: Two times a day (BID) | ORAL | Status: AC
  Administered 2022-06-20 (×2): 40 meq via ORAL
  Filled 2022-06-20 (×2): qty 2

## 2022-06-20 NOTE — Progress Notes (Signed)
PROGRESS NOTE    Veronica Calhoun  [id number] DOB: 1985-06-20 DOA: 06/10/2022 PCP: Pcp, No   Brief Narrative:    Veronica Calhoun is a 37 y.o. female with medical history significant of alcohol dependence, tobacco use disorder, hypertension-not on medication, generalized anxiety disorder-not on medication, and more presents the ED with a chief complaint of abdominal pain.  She does have daily alcohol use and was admitted with alcohol induced acute pancreatitis.  She is also noted to have hypertensive crisis and is now requiring Cardizem drip.  She continues to have some ongoing sinus tachycardia.  It appears that she has been noncompliant with her home blood pressure medications.  CT abdomen and pelvis performed 2/7 with findings of severe pancreatitis and emphysematous cystitis.  Started on Rocephin with urine cultures showing E Coli with sensitivities pending.  She still remains tachycardic and D-dimer noted to be elevated.  She underwent further imaging on 2/11 with CT angiogram of the chest with findings of multilobar pneumonia and no PE.  CT of the abdomen demonstrating ongoing moderate to severe pancreatitis with no other acute complications.  GI following and assisting with management.  She appears to now be improving and diet will be advanced to full liquid today.  Assessment & Plan:   Principal Problem:   Alcohol induced acute pancreatitis Active Problems:   Anxiety   Alcohol use disorder, severe, dependence (HCC)   Acute pancreatitis   Hypertensive crisis   Transaminitis   Tobacco use disorder   Tachycardia   Hypokalemia  Assessment and Plan:   Alcohol induced acute severe pancreatitis-improving -Appears to be a recurrent problem in the setting of her alcohol use drinks daily -Abdominal pain, transaminitis, elevated lipase -Abd US shows normal size for common bile duct and normal gall bladder -Patient is not on any medications that would be likely to cause  pancreatitis -Advised on the importance of EtOH cessation -Advance to full liquid diet 2/16 -Discontinue further IV fluid -Weaned off of Dilaudid PCA 2/15 and continue IV Dilaudid pushes as needed -Repeat CT 2/11 with moderate to severe pancreatitis with ongoing symptoms, appreciate GI evaluation -Continue to monitor with a.m. labs   Sinus tachycardia improved -Okay for transfer to telemetry today - Related to volume depletion in the setting of above, continue aggressive IV fluid -Also related to pain and some alcohol withdrawal -Patient also has not been taking her home metoprolol which has been resumed -Continue to monitor closely in the ICU -TSH elevated at 4.561, free T4 within normal limits -Increased metoprolol to 50 mg twice daily 2/9 -D-dimer elevated and PE study negative for PE -Improved with better pain control   Mild hypokalemia/hypomagnesemia -In the setting of Lasix administration -Replete and reevaluate in a.m.  Multilobar pneumonia possibly HCAP -Started on cefepime with end date 2/17  Worsening leukocytosis-now improving Likely related to above factors, continue to monitor  AKI-resolved Monitor strict I's and O's Avoid nephrotoxic agents Continue to follow labs and continue aggressive IV fluid Lasix x 2 doses as ordered per GI, completed 2/16  Emphysematous cystitis with E. coli UTI -Treat empirically with Cefepime as above, ongoing   Tobacco use disorder - Smokes 1 pack/day - Nicotine patch ordered - Continue to monitor   Transaminitis-downtrending -related to EtOH and pancreatits -US shows normal CBD  - not likely to have an obstructing stone -If transaminases continue to rise on tomorrow's BMP consider MRCP -Hold hepatotoxic agents when possible -Continue to monitor   Hypertensive crisis-resolved -Noted to previously be  on home clonidine, metoprolol, and HCTZ -Resume on metoprolol and clonidine for now and HCTZ later -Increased metoprolol with  noted ongoing sinus tachycardia to 50 mg twice daily   Alcohol use disorder, severe, dependence (HCC) -CIWA protocol -Advised on importance of cessation   Anxiety -Ativan given in the ED -Continue ativan per CIWA protocol -Continue to monitor  Obesity BMI 42.02    DVT prophylaxis:Heparin Code Status: Full Family Communication: Sister at bedside 2/13 Disposition Plan: Anticipate discharge to home in the next 48 hours once further improved Status is: Inpatient Remains inpatient appropriate because: Need for IV fluid   Consultants:  GI  Procedures:  See below  Antimicrobials:  Anti-infectives (From admission, onward)    Start     Dose/Rate Route Frequency Ordered Stop   06/20/22 1445  ceFEPIme (MAXIPIME) 2 g in sodium chloride 0.9 % 100 mL IVPB        2 g 200 mL/hr over 30 Minutes Intravenous Every 8 hours 06/20/22 1017 06/22/22 0644   06/15/22 1430  ceFEPIme (MAXIPIME) 2 g in sodium chloride 0.9 % 100 mL IVPB  Status:  Discontinued        2 g 200 mL/hr over 30 Minutes Intravenous Every 8 hours 06/15/22 1345 06/20/22 1017   06/11/22 2145  cefTRIAXone (ROCEPHIN) 1 g in sodium chloride 0.9 % 100 mL IVPB  Status:  Discontinued        1 g 200 mL/hr over 30 Minutes Intravenous Every 24 hours 06/11/22 2053 06/15/22 1338       Subjective: Patient seen and evaluated today with improving abdominal pain noted.  She is having multiple bowel movements and no nausea or vomiting and is tolerating diet.  Advance to full liquid diet today.  Objective: Vitals:   06/14/22 0031 06/14/22 0106 06/14/22 0532 06/14/22 0750  BP:  110/71 (!) 142/95 (!) 151/92  Pulse: (!) 113 (!) 109 (!) 110 (!) 118  Resp:  (!) 30 (!) 30 (!) 21  Temp:  99 F (37.2 C) 98.1 F (36.7 C)   TempSrc:  Oral    SpO2:  95% 96% 96%  Weight:      Height:        Intake/Output Summary (Last 24 hours) at 06/14/2022 1057 Last data filed at 06/14/2022 0900 Gross per 24 hour  Intake 3701.24 ml  Output --  Net  3701.24 ml   Filed Weights   06/10/22 2119 06/11/22 0500 06/13/22 2207  Weight: 118.1 kg 118.1 kg 130.9 kg    Examination:  General exam: Appears calm and comfortable, obese Respiratory system: Clear to auscultation. Respiratory effort normal. Cardiovascular system: S1 & S2 heard, RRR.  Tachycardic Gastrointestinal system: Abdomen is tender to palpation and distended Central nervous system: Alert and awake Extremities: No edema Skin: No significant lesions noted Psychiatry: Flat affect.    Data Reviewed: I have personally reviewed following labs and imaging studies  CBC: Recent Labs  Lab 06/10/22 1341 06/11/22 0350 06/12/22 0446 06/13/22 0524 06/14/22 0538  WBC 8.9 15.7* 22.1* 19.8* 19.5*  NEUTROABS  --  12.9*  --   --   --   HGB 16.1* 18.0* 15.5* 14.4 12.8  HCT 45.2 52.0* 45.6 41.8 37.0  MCV 97.4 100.2* 103.9* 102.0* 101.1*  PLT 263 208 112* 102* [id number]*   Basic Metabolic Panel: Recent Labs  Lab 06/10/22 1341 06/11/22 0350 06/12/22 0446 06/13/22 0524 06/14/22 0538  NA 133* 132* 133* 131* 132*  K 3.3* 4.5 4.5 4.4 4.0  CL 100 101 105  108 105  CO2 19* 18* 20* 16* 18*  GLUCOSE 209* 221* 154* 82 68*  BUN <5* 8 18 27* 16  CREATININE 0.54 0.85 1.06* 0.94 0.70  CALCIUM 8.4* 8.2* 6.8* 6.7* 7.3*  MG  --  1.6* 1.9 1.8 2.0   GFR: Estimated Creatinine Clearance: 137.5 mL/min (by C-G formula based on SCr of 0.7 mg/dL). Liver Function Tests: Recent Labs  Lab 06/10/22 1341 06/11/22 0350 06/12/22 0446 06/13/22 0524 06/14/22 0538  AST 308* 142* 65* 59* 56*  ALT 115* 83* 45* 36 32  ALKPHOS 169* 133* 100 95 94  BILITOT 1.2 1.2 2.6* 4.5* 7.6*  PROT 8.2* 7.5 5.9* 5.9* 6.0*  ALBUMIN 3.5 3.1* 2.3* 2.2* 2.0*   Recent Labs  Lab 06/10/22 1341 06/10/22 2055  LIPASE 965* 637*   No results for input(s): "AMMONIA" in the last 168 hours. Coagulation Profile: No results for input(s): "INR", "PROTIME" in the last 168 hours. Cardiac Enzymes: No results for input(s):  "CKTOTAL", "CKMB", "CKMBINDEX", "TROPONINI" in the last 168 hours. BNP (last 3 results) No results for input(s): "PROBNP" in the last 8760 hours. HbA1C: No results for input(s): "HGBA1C" in the last 72 hours. CBG: No results for input(s): "GLUCAP" in the last 168 hours. Lipid Profile: No results for input(s): "CHOL", "HDL", "LDLCALC", "TRIG", "CHOLHDL", "LDLDIRECT" in the last 72 hours. Thyroid Function Tests: Recent Labs    06/12/22 1322 06/13/22 0750  TSH 4.561*  --   FREET4  --  0.98   Anemia Panel: No results for input(s): "VITAMINB12", "FOLATE", "FERRITIN", "TIBC", "IRON", "RETICCTPCT" in the last 72 hours. Sepsis Labs: No results for input(s): "PROCALCITON", "LATICACIDVEN" in the last 168 hours.  Recent Results (from the past 240 hour(s))  MRSA Next Gen by PCR, Nasal     Status: None   Collection Time: 06/10/22  8:29 PM   Specimen: Nasal Mucosa; Nasal Swab  Result Value Ref Range Status   MRSA by PCR Next Gen NOT DETECTED NOT DETECTED Final    Comment: (NOTE) The GeneXpert MRSA Assay (FDA approved for NASAL specimens only), is one component of a comprehensive MRSA colonization surveillance program. It is not intended to diagnose MRSA infection nor to guide or monitor treatment for MRSA infections. Test performance is not FDA approved in patients less than 76 years old. Performed at [facility], [address], [place], [place] [address]   Urine Culture (for pregnant, neutropenic or urologic patients or patients with an indwelling urinary catheter)     Status: Abnormal   Collection Time: 06/11/22  9:40 PM   Specimen: Urine, Clean Catch  Result Value Ref Range Status   Specimen Description URINE, CLEAN CATCH  Final   Special Requests NONE  Final   Culture >=100,000 COLONIES/mL ESCHERICHIA COLI (A)  Final   Report Status 06/14/2022 FINAL  Final   Organism ID, Bacteria ESCHERICHIA COLI (A)  Final      Susceptibility   Escherichia coli - MIC*    AMPICILLIN >=32  RESISTANT Resistant     CEFEPIME <=0.12 SENSITIVE Sensitive     CEFTRIAXONE <=0.25 SENSITIVE Sensitive     CIPROFLOXACIN >=4 RESISTANT Resistant     GENTAMICIN <=1 SENSITIVE Sensitive     IMIPENEM <=0.25 SENSITIVE Sensitive     NITROFURANTOIN <=16 SENSITIVE Sensitive     TRIMETH/SULFA >=320 RESISTANT Resistant     AMPICILLIN/SULBACTAM 4 SENSITIVE Sensitive     PIP/TAZO Value in next row Sensitive      <=4 SENSITIVEPerformed at [facility], [address] [place], [place] [address]    * >=100,000 COLONIES/mL ESCHERICHIA COLI  Culture, blood (Routine X 2) w Reflex to ID Panel     Status: None (Preliminary result)   Collection Time: 06/12/22  8:28 AM   Specimen: BLOOD  Result Value Ref Range Status   Specimen Description BLOOD RIGHT HAND  Final   Special Requests   Final    BOTTLES DRAWN AEROBIC AND ANAEROBIC Blood Culture results may not be optimal due to an inadequate volume of blood received in culture bottles   Culture   Final    NO GROWTH 2 DAYS Performed at [facility], [address], [place], [place] [address]    Report Status PENDING  Incomplete         Radiology Studies: No results found.      Scheduled Meds:  Chlorhexidine Gluconate Cloth  6 each Topical Q0600   cloNIDine  0.2 mg Oral BID   folic acid  1 mg Oral Daily   heparin  5,000 Units Subcutaneous Q8H   LORazepam  0-4 mg Intravenous Q12H   metoprolol tartrate  50 mg Oral BID   multivitamin with minerals  1 tablet Oral Daily   nicotine  21 mg Transdermal Daily   pantoprazole (PROTONIX) IV  40 mg Intravenous Q12H   polyethylene glycol  17 g Oral Daily   thiamine  100 mg Oral Daily   Or   thiamine  100 mg Intravenous Daily   Continuous Infusions:  sodium chloride 150 mL/hr at 06/14/22 0643   cefTRIAXone (ROCEPHIN)  IV 200 mL/hr at 06/13/22 2133   niCARDipine Stopped (06/12/22 1107)     LOS: 3 days    Time spent: 35 minutes    Shawnise Peterkin Darleen Crocker, DO [facility]  If 7PM-7AM,  please contact night-coverage [email or web address] 06/14/2022, 10:57 AM

## 2022-06-20 NOTE — Progress Notes (Signed)
Gastroenterology Progress Note   Referring Provider: No ref. provider found Primary Care Physician:  Pcp, No Primary Gastroenterologist:  Dr. Cristopher Estimable.Rourk, MD   Patient ID: Veronica Calhoun; [id number]; 08-21-1985    Subjective   Tolerating clears, will attempt full liquids for lunch. Reported 4 occurrences of urination after lasix use. Had 2 Bms in the last 24 hours. + flatus. Denies any N/V. Pain is improving. Reports she has walked from bed to door multiple times yesterday and once this morning.   Objective   Vital signs in last 24 hours Temp:  [98.1 F (36.7 C)-98.3 F (36.8 C)] 98.3 F (36.8 C) (02/16 0431) Pulse Rate:  [84-99] 84 (02/16 0747) Resp:  [16-24] 16 (02/16 0747) BP: (126-149)/(77-94) 139/94 (02/16 0747) SpO2:  [99 %-100 %] 99 % (02/16 0747) FiO2 (%):  [11 %] 11 % (02/15 1252) Last BM Date : 06/20/22  Physical Exam General:   Alert and oriented, pleasant Head:  Normocephalic and atraumatic. Eyes:  No icterus, sclera clear. Conjuctiva pink.  Abdomen:  Bowel sounds present, soft, non pitting edema to abdominal wall (significantly improved from prior), diffuse mild tenderness.  No HSM or hernias noted. No rebound or guarding. No masses appreciated  Msk:  Symmetrical without gross deformities. Normal posture. Extremities:  no pitting edema to BLE.  Neurologic:  Alert and  oriented x4;  grossly normal neurologically. Skin:  Warm and dry, intact without significant lesions.  Psych:  Alert and cooperative. Normal mood and affect.  Intake/Output from previous day: 02/15 0701 - 02/16 0700 In: 720 [P.O.:720] Out: -  Intake/Output this shift: No intake/output data recorded.  Lab Results  Recent Labs    06/18/22 0436 06/19/22 0753 06/20/22 0428  WBC 33.2* 31.2* 27.5*  HGB 11.4* 10.9* 11.1*  HCT 33.5* 31.3* 31.8*  PLT 279 256 270   BMET Recent Labs    06/18/22 0436 06/19/22 0753 06/20/22 0428  NA 137 134* 135  K 3.6 3.7 3.1*  CL 111 109 108   CO2 20* 17* 19*  GLUCOSE 96 107* 112*  BUN 16 13 12  $ CREATININE 0.54 0.49 0.48  CALCIUM 8.0* 7.7* 7.8*   LFT Recent Labs    06/18/22 0436 06/19/22 0753 06/20/22 0428  PROT 6.1* 6.2* 5.9*  ALBUMIN 1.7* 1.7* 1.6*  AST 80* 83* 105*  ALT 37 38 45*  ALKPHOS 113 113 115  BILITOT 9.3* 8.1* 7.7*   PT/INR Recent Labs    06/18/22 0931 06/20/22 0428  LABPROT 16.2* 16.3*  INR 1.3* 1.3*   Hepatitis Panel No results for input(s): "HEPBSAG", "HCVAB", "HEPAIGM", "HEPBIGM" in the last 72 hours.    Studies/Results DG Abd 1 View  Result Date: 06/17/2022 CLINICAL DATA:  Abdominal distention EXAM: ABDOMEN - 1 VIEW COMPARISON:  None Available. FINDINGS: Gaseous distention of the colon. Scattered nondilated gas-filled loops of small bowel. No radio-opaque calculi or other significant radiographic abnormality are seen. IMPRESSION: Gaseous distention of the colon, likely due to ileus. Electronically Signed   By: Yetta Glassman M.D.   On: 06/17/2022 13:27   CT Angio Chest Pulmonary Embolism (PE) W or WO Contrast  Result Date: 06/15/2022 CLINICAL DATA:  Tachycardia. Elevated D-dimer. Alcohol-induced acute pancreatitis. EXAM: CT ANGIOGRAPHY CHEST CT ABDOMEN AND PELVIS WITH CONTRAST TECHNIQUE: Multidetector CT imaging of the chest was performed using the standard protocol during bolus administration of intravenous contrast. Multiplanar CT image reconstructions and MIPs were obtained to evaluate the vascular anatomy. Multidetector CT imaging of the abdomen and pelvis was  performed using the standard protocol during bolus administration of intravenous contrast. RADIATION DOSE REDUCTION: This exam was performed according to the departmental dose-optimization program which includes automated exposure control, adjustment of the mA and/or kV according to patient size and/or use of iterative reconstruction technique. CONTRAST:  1104m OMNIPAQUE IOHEXOL 350 MG/ML SOLN COMPARISON:  AP CT on 06/11/2022 FINDINGS:  CTA CHEST FINDINGS Cardiovascular: Satisfactory opacification of pulmonary arteries noted, and no pulmonary emboli identified. No evidence of thoracic aortic dissection or aneurysm. Mediastinum/Nodes: No masses or pathologically enlarged lymph nodes identified. Lungs/Pleura: Airspace disease is seen in both upper lobes and right middle lobe, suspicious for pneumonia. Left lower lobe atelectasis and tiny left pleural effusion noted. Musculoskeletal: No suspicious bone lesions identified. Review of the MIP images confirms the above findings. CT ABDOMEN and PELVIS FINDINGS Hepatobiliary: No hepatic masses identified. Moderate diffuse hepatic steatosis again noted. Gallbladder is unremarkable. No evidence of biliary ductal dilatation. Pancreas: Moderate to severe acute pancreatitis shows no significant change. No evidence of pancreatic necrosis or mass. Evidence of pancreatic ductal dilatation. Moderate peripancreatic fluid is seen which tracts inferiorly into the pelvis. This shows mild decrease since previous study. No pseudocysts identified. Spleen: Within normal limits in size and appearance. Adrenals/Urinary Tract: No suspicious masses identified. No evidence of ureteral calculi or hydronephrosis. Stomach/Bowel: Reactive wall thickening is again seen involving the stomach secondary to acute pancreatitis. Increased diffuse colonic dilatation is seen, consistent with ileus. Vascular/Lymphatic: No pathologically enlarged lymph nodes. No acute vascular findings. Reproductive:  No mass or other significant abnormality. Other:  Increased diffuse body wall edema. Musculoskeletal:  No suspicious bone lesions identified. Review of the MIP images confirms the above findings. IMPRESSION: No evidence of pulmonary embolism. Airspace disease in both upper lobes and right middle lobe, suspicious for pneumonia. Left lower lobe atelectasis and tiny left pleural effusion. Moderate to severe acute pancreatitis, with mild decrease in  peripancreatic fluid and ascites. No evidence of pancreatic necrosis or pseudocysts. Worsening colonic ileus. Increased diffuse body wall edema. Stable hepatic steatosis. Electronically Signed   By: JMarlaine HindM.D.   On: 06/15/2022 13:19   CT ABDOMEN PELVIS W CONTRAST  Result Date: 06/15/2022 CLINICAL DATA:  Tachycardia. Elevated D-dimer. Alcohol-induced acute pancreatitis. EXAM: CT ANGIOGRAPHY CHEST CT ABDOMEN AND PELVIS WITH CONTRAST TECHNIQUE: Multidetector CT imaging of the chest was performed using the standard protocol during bolus administration of intravenous contrast. Multiplanar CT image reconstructions and MIPs were obtained to evaluate the vascular anatomy. Multidetector CT imaging of the abdomen and pelvis was performed using the standard protocol during bolus administration of intravenous contrast. RADIATION DOSE REDUCTION: This exam was performed according to the departmental dose-optimization program which includes automated exposure control, adjustment of the mA and/or kV according to patient size and/or use of iterative reconstruction technique. CONTRAST:  1012mOMNIPAQUE IOHEXOL 350 MG/ML SOLN COMPARISON:  AP CT on 06/11/2022 FINDINGS: CTA CHEST FINDINGS Cardiovascular: Satisfactory opacification of pulmonary arteries noted, and no pulmonary emboli identified. No evidence of thoracic aortic dissection or aneurysm. Mediastinum/Nodes: No masses or pathologically enlarged lymph nodes identified. Lungs/Pleura: Airspace disease is seen in both upper lobes and right middle lobe, suspicious for pneumonia. Left lower lobe atelectasis and tiny left pleural effusion noted. Musculoskeletal: No suspicious bone lesions identified. Review of the MIP images confirms the above findings. CT ABDOMEN and PELVIS FINDINGS Hepatobiliary: No hepatic masses identified. Moderate diffuse hepatic steatosis again noted. Gallbladder is unremarkable. No evidence of biliary ductal dilatation. Pancreas: Moderate to severe  acute pancreatitis shows no significant  change. No evidence of pancreatic necrosis or mass. Evidence of pancreatic ductal dilatation. Moderate peripancreatic fluid is seen which tracts inferiorly into the pelvis. This shows mild decrease since previous study. No pseudocysts identified. Spleen: Within normal limits in size and appearance. Adrenals/Urinary Tract: No suspicious masses identified. No evidence of ureteral calculi or hydronephrosis. Stomach/Bowel: Reactive wall thickening is again seen involving the stomach secondary to acute pancreatitis. Increased diffuse colonic dilatation is seen, consistent with ileus. Vascular/Lymphatic: No pathologically enlarged lymph nodes. No acute vascular findings. Reproductive:  No mass or other significant abnormality. Other:  Increased diffuse body wall edema. Musculoskeletal:  No suspicious bone lesions identified. Review of the MIP images confirms the above findings. IMPRESSION: No evidence of pulmonary embolism. Airspace disease in both upper lobes and right middle lobe, suspicious for pneumonia. Left lower lobe atelectasis and tiny left pleural effusion. Moderate to severe acute pancreatitis, with mild decrease in peripancreatic fluid and ascites. No evidence of pancreatic necrosis or pseudocysts. Worsening colonic ileus. Increased diffuse body wall edema. Stable hepatic steatosis. Electronically Signed   By: Marlaine Hind M.D.   On: 06/15/2022 13:19   CT ABDOMEN PELVIS W CONTRAST  Result Date: 06/11/2022 CLINICAL DATA:  Pancreatitis, abdominal pain, alcohol abuse EXAM: CT ABDOMEN AND PELVIS WITH CONTRAST TECHNIQUE: Multidetector CT imaging of the abdomen and pelvis was performed using the standard protocol following bolus administration of intravenous contrast. RADIATION DOSE REDUCTION: This exam was performed according to the departmental dose-optimization program which includes automated exposure control, adjustment of the mA and/or kV according to patient size  and/or use of iterative reconstruction technique. CONTRAST:  14m OMNIPAQUE IOHEXOL 300 MG/ML  SOLN COMPARISON:  06/10/2022, 12/28/2021 FINDINGS: Lower chest: Hypoventilatory changes at the lung bases. Trace left pleural effusion. Hepatobiliary: Hepatic steatosis. Stable hepatomegaly. No biliary duct dilation. The gallbladder is unremarkable. Pancreas: Heterogeneous decreased enhancement within the head and uncinate process of the pancreas consistent with interstitial edema and underlying pancreatitis. There is marked peripancreatic fat stranding and free fluid. No organized fluid collection, abscess, or pseudocyst at this time. No pancreatic duct dilation. Spleen: Normal in size without focal abnormality. Adrenals/Urinary Tract: Punctate less than 2 mm nonobstructing left renal calculus. No obstructive uropathy within either kidney. The adrenals are grossly normal. The bladder is moderately distended, with intramural gas seen throughout the dependent portion of the gallbladder concerning for emphysematous cystitis. A small amount of intraluminal gas within the bladder may reflect recent catheterization. Please correlate with urinalysis. Stomach/Bowel: No bowel obstruction or ileus. There is secondary wall thickening of the duodenum adjacent to the acute pancreatitis described above. No other wall thickening. Vascular/Lymphatic: No significant vascular findings are present. No enlarged abdominal or pelvic lymph nodes. Reproductive: Uterus and bilateral adnexa are unremarkable. Other: Small volume ascites throughout the lower abdomen and pelvis. No free intraperitoneal gas. No abdominal wall hernia. Musculoskeletal: No acute or destructive bony lesions. Reconstructed images demonstrate no additional findings. IMPRESSION: 1. Severe acute pancreatitis, with edematous changes throughout the head and uncinate process of the pancreas. No evidence of necrosis, fluid collection, abscess, or pseudocyst at this time. 2.  Intramural gas within the posterior bladder wall, compatible with emphysematous cystitis. Please correlate with urinalysis. 3. Lower abdominal and pelvic ascites. 4. Trace left pleural effusion. 5. Stable hepatic steatosis. 6. Punctate less than 2 mm nonobstructing left renal calculus. These results will be called to the ordering clinician or representative by the Radiologist Assistant, and communication documented in the PACS or [employer] Electronically Signed   By: MLegrand Como  Owens Shark M.D.   On: 06/11/2022 20:29   US Abdomen Complete  Result Date: 06/10/2022 CLINICAL DATA:  Epigastric abdominal pain since this morning. EXAM: ABDOMEN ULTRASOUND COMPLETE COMPARISON:  CT scan 12/28/2021 FINDINGS: Gallbladder: No gallstones or wall thickening visualized. No sonographic Murphy sign noted by sonographer. Common bile duct: Diameter: 5.0 mm Liver: There is diffuse increased echogenicity of the liver and decreased through transmission consistent with fatty infiltration. No focal lesions or biliary dilatation. Portal vein is patent on color Doppler imaging with normal direction of blood flow towards the liver. IVC: Normal caliber Pancreas: Visualized portion unremarkable. Spleen: Not well visualize. Right Kidney: Length: 13.4 cm. Normal renal cortical thickness and echogenicity without focal lesions or hydronephrosis. Left Kidney: Length: 11.4 cm. Normal renal cortical thickness and echogenicity without focal lesions or hydronephrosis. Abdominal aorta: Poorly visualized. Other findings: No ascites IMPRESSION: 1. Diffuse fatty infiltration liver but no hepatic lesions or intrahepatic biliary dilatation. 2. Normal gallbladder. 3. Poorly visualized pancreas, spleen and aorta. 4. Normal kidneys.  No ascites. Electronically Signed   By: Marijo Sanes M.D.   On: 06/10/2022 16:46    Assessment  37 y.o. female with a history of anxiety, HTN, alcohol abuse currently admitted with acute pancreatitis secondary to alcohol,  possible pneumonia, AKI, and emphysematous cystitis with E.Coli UTI. GI consulted for pancreatitis management.   Pancreatitis: Likely secondary to etoh given no evidence of other contributing factor. Has tolerated clear liquids. Previously on dilaudid pca which was discontinued yesterday. Currently receiving dilaudid 0.5 mg every 2 hours and received 9 doses since PCA discontinued (4.29m) along with 3 doses of cough syrup overnight and into this morning. Also received one dose of ibuprofen overnight. Has been tolerating clear liquids, trial of full liquids for lunch. Consider advancing to soft diet tomorrow if tolerating.   Ileus: KUB 2/13 with ongoing gaseous distention in the colon, likely ileus. Currently with active bowel sounds and has continued to report multiple BMs daily since hospitalization although not documented. Less distention to abdomen today, overall improved. Narcotic se has been decreased. Continued to encourage more frequent ambulation with a lap around nurses station. Discussed continuing less use of narcotics.   Alcoholic hepatitis: CT with stable hepatic steatosis without biliary ductal dilation on 06/15/22. Acute hepatitis panel negative. T bili continues to improve. Steroids have not been required due to low DF. Today DF - 22.9. Continue to trend CMP and INR.  Leukocytosis: Initial WBC 8.9 and peaked at 33.2 on 2/14. Continued to trend down and currently WBC 27.5. Likely in the setting of possible pneumonia and E.Coli UTI.   Anasarca: Albumin 1.6. Given lasix 40 mg IV once yesterday and once this morning. No documented I&O and no recent weight documented. Last weight 06/13/22 - 130.9kg. Need to update weight. Reassess fluid status tomorrow.  Plan / Recommendations   Daily CBC, CMP, INR PPI BID Continue to taper narcotic use Miralax TID Frequent ambulation Diet: full liquids, consider advancing tomorrow if tolerating.  Replace potassium per hospitalist Daily weights Alcohol  cessation   LOS: 9 days    06/20/2022, 12:09 PM   CVenetia Night MSN, FNP-BC, AGACNP-BC [facility]

## 2022-06-20 NOTE — Progress Notes (Signed)
Patient ambulated to restroom and bedside commode this shift. Patient called out 6 six times this shift for pain medication, gave doses as PRN order allowed. She also received Robitussin twice this shift for cough, and Ibuprofen once. Continued to monitor no changes noted. Patient at the beginning of shift believed her Cough medication and pain medication could be given more frequent in formed patient the parameters on the orders, with little Evidence of acceptance. Patient still called after an hour and 30 min's passed for pain medication and would also request cough medication. Reminded patient again of order parameters.

## 2022-06-21 DIAGNOSIS — K8500 Idiopathic acute pancreatitis without necrosis or infection: Secondary | ICD-10-CM

## 2022-06-21 DIAGNOSIS — I169 Hypertensive crisis, unspecified: Secondary | ICD-10-CM | POA: Diagnosis not present

## 2022-06-21 LAB — CBC
HCT: 32.4 % — ABNORMAL LOW (ref 36.0–46.0)
Hemoglobin: 10.8 g/dL — ABNORMAL LOW (ref 12.0–15.0)
MCH: 34.1 pg — ABNORMAL HIGH (ref 26.0–34.0)
MCHC: 33.3 g/dL (ref 30.0–36.0)
MCV: 102.2 fL — ABNORMAL HIGH (ref 80.0–100.0)
Platelets: 290 10*3/uL (ref 150–400)
RBC: 3.17 MIL/uL — ABNORMAL LOW (ref 3.87–5.11)
RDW: 16.4 % — ABNORMAL HIGH (ref 11.5–15.5)
WBC: 26 10*3/uL — ABNORMAL HIGH (ref 4.0–10.5)
nRBC: 0.1 % (ref 0.0–0.2)

## 2022-06-21 LAB — COMPREHENSIVE METABOLIC PANEL
ALT: 64 U/L — ABNORMAL HIGH (ref 0–44)
AST: 139 U/L — ABNORMAL HIGH (ref 15–41)
Albumin: 1.7 g/dL — ABNORMAL LOW (ref 3.5–5.0)
Alkaline Phosphatase: 135 U/L — ABNORMAL HIGH (ref 38–126)
Anion gap: 9 (ref 5–15)
BUN: 10 mg/dL (ref 6–20)
CO2: 18 mmol/L — ABNORMAL LOW (ref 22–32)
Calcium: 7.9 mg/dL — ABNORMAL LOW (ref 8.9–10.3)
Chloride: 109 mmol/L (ref 98–111)
Creatinine, Ser: 0.4 mg/dL — ABNORMAL LOW (ref 0.44–1.00)
GFR, Estimated: >60 mL/min (ref >60)
Glucose, Bld: 106 mg/dL — ABNORMAL HIGH (ref 70–99)
Potassium: 4 mmol/L (ref 3.5–5.1)
Sodium: 136 mmol/L (ref 135–145)
Total Bilirubin: 8.1 mg/dL — ABNORMAL HIGH (ref 0.3–1.2)
Total Protein: 6.1 g/dL — ABNORMAL LOW (ref 6.5–8.1)

## 2022-06-21 LAB — MAGNESIUM: Magnesium: 1.9 mg/dL (ref 1.7–2.4)

## 2022-06-21 MED ORDER — HYDROMORPHONE HCL 1 MG/ML IJ SOLN
0.5000 mg | INTRAMUSCULAR | Status: DC | PRN
  Administered 2022-06-21 – 2022-06-23 (×13): 0.5 mg via INTRAVENOUS
  Filled 2022-06-21 (×13): qty 0.5

## 2022-06-21 MED ORDER — LACTULOSE 10 GM/15ML PO SOLN
30.0000 g | Freq: Every day | ORAL | Status: DC
  Administered 2022-06-22 – 2022-06-23 (×2): 30 g via ORAL
  Filled 2022-06-21 (×2): qty 60

## 2022-06-21 NOTE — Progress Notes (Signed)
Patient is resting in her bed at this time. Patient has received 10 prn medication during this shift. Patient ambulated independently to the restroom several times during this shift. Patient stated she has had several bowel movements throughout the night. Reminded patient x2 of the frequency that medication is ordered.  Patient expressing understanding. Plan of care ongoing.

## 2022-06-21 NOTE — Progress Notes (Signed)
PROGRESS NOTE    Veronica Calhoun  [id number] DOB: 29-Jan-1986 DOA: 06/10/2022 PCP: Pcp, No   Brief Narrative:    Veronica Calhoun is a 37 y.o. female with medical history significant of alcohol dependence, tobacco use disorder, hypertension-not on medication, generalized anxiety disorder-not on medication, and more presents the ED with a chief complaint of abdominal pain.  She does have daily alcohol use and was admitted with alcohol induced acute pancreatitis.  She is also noted to have hypertensive crisis and is now requiring Cardizem drip.  She continues to have some ongoing sinus tachycardia.  It appears that she has been noncompliant with her home blood pressure medications.  CT abdomen and pelvis performed 2/7 with findings of severe pancreatitis and emphysematous cystitis.  Started on Rocephin with urine cultures showing E Coli with sensitivities pending.  She still remains tachycardic and D-dimer noted to be elevated.  She underwent further imaging on 2/11 with CT angiogram of the chest with findings of multilobar pneumonia and no PE.  CT of the abdomen demonstrating ongoing moderate to severe pancreatitis with no other acute complications.  GI following and assisting with management.  She appears to now be improving and diet will be advanced to full liquid today.  Assessment & Plan:   Principal Problem:   Alcohol induced acute pancreatitis Active Problems:   Anxiety   Alcohol use disorder, severe, dependence (HCC)   Acute pancreatitis   Hypertensive crisis   Transaminitis   Tobacco use disorder   Tachycardia   Hypokalemia  Assessment and Plan:   Alcohol induced acute severe pancreatitis-improving slowly -Appears to be a recurrent problem in the setting of her alcohol use drinks daily -Abdominal pain, transaminitis, elevated lipase -Abd US shows normal size for common bile duct and normal gall bladder -Patient is not on any medications that would be likely to cause  pancreatitis -Advised on the importance of EtOH cessation -Weaned off of Dilaudid PCA 2/15 and continue IV Dilaudid pushes as needed -Repeat CT 2/11 with moderate to severe pancreatitis with ongoing symptoms, appreciate GI evaluation -06/21/22 -Tolerated full liquids well, will advance to solids on 06/22/2022   Sinus tachycardia improved - Related to volume depletion in the setting of above, continue aggressive IV fluid -Also related to pain and some alcohol withdrawal -Patient also has not been taking her home metoprolol which has been resumed -Continue to monitor closely in the ICU -TSH elevated at 4.561, free T4 within normal limits 06/21/22 Heart rate improved with increased dose of metoprolol 50 mg twice daily -D-dimer elevated and PE study negative for PE -Improved with better pain control   Mild hypokalemia/hypomagnesemia -In the setting of Lasix administration -Replete   Multilobar pneumonia possibly HCAP -Leukocytosis noted -Continue IV cefepime started on 06/21/2022  Emphysematous cystitis with E. coli UTI -Leukocytosis and IV cefepime as above  Tobacco use disorder - Smokes 1 pack/day - Nicotine patch ordered - Continue to monitor   Transaminitis-  -related to EtOH and pancreatits -US shows normal CBD  - not likely to have an obstructing stone -Hold hepatotoxic agents when possible -Can do MRCP if LFTs continue to trend up    Latest Ref Rng & Units 06/21/2022    5:52 AM 06/20/2022    4:28 AM 06/19/2022    7:53 AM  Hepatic Function  Total Protein 6.5 - 8.1 g/dL 6.1  5.9  6.2   Albumin 3.5 - 5.0 g/dL 1.7  1.6  1.7   AST 15 - 41 U/L 139  105  83   ALT 0 - 44 U/L 64  45  38   Alk Phosphatase 38 - 126 U/L 135  115  113   Total Bilirubin 0.3 - 1.2 mg/dL 8.1  7.7  8.1      Hypertensive crisis-resolved -Adequate BP control on clonidine 0.2 mg twice daily, metoprolol 50 mg twice daily   Alcohol use disorder, severe, dependence (HCC) -CIWA protocol -Advised on  importance of cessation   Anxiety -Ativan given in the ED -Continue ativan per CIWA protocol -Continue to monitor  Morbid Obesity- -Low calorie diet, portion control and increase physical activity discussed with patient -Body mass index is 49.07 kg/m.   DVT prophylaxis:Heparin Code Status: Full Family Communication: None at bedside Disposition Plan: Anticipate discharge to home in the next 48 hours once further improved Status is: Inpatient Remains inpatient appropriate because: Need for IV fluid   Consultants:  GI  Procedures:  See below  Antimicrobials:  Anti-infectives (From admission, onward)    Start     Dose/Rate Route Frequency Ordered Stop   06/20/22 1445  ceFEPIme (MAXIPIME) 2 g in sodium chloride 0.9 % 100 mL IVPB        2 g 200 mL/hr over 30 Minutes Intravenous Every 8 hours 06/20/22 1017 06/22/22 0644   06/15/22 1430  ceFEPIme (MAXIPIME) 2 g in sodium chloride 0.9 % 100 mL IVPB  Status:  Discontinued        2 g 200 mL/hr over 30 Minutes Intravenous Every 8 hours 06/15/22 1345 06/20/22 1017   06/11/22 2145  cefTRIAXone (ROCEPHIN) 1 g in sodium chloride 0.9 % 100 mL IVPB  Status:  Discontinued        1 g 200 mL/hr over 30 Minutes Intravenous Every 24 hours 06/11/22 2053 06/15/22 1338       Subjective: -Continues to require frequent as needed pain medications -Willing to advance diet -No emesis -Had BM  Objective: Vitals:   06/14/22 0031 06/14/22 0106 06/14/22 0532 06/14/22 0750  BP:  110/71 (!) 142/95 (!) 151/92  Pulse: (!) 113 (!) 109 (!) 110 (!) 118  Resp:  (!) 30 (!) 30 (!) 21  Temp:  99 F (37.2 C) 98.1 F (36.7 C)   TempSrc:  Oral    SpO2:  95% 96% 96%  Weight:      Height:        Intake/Output Summary (Last 24 hours) at 06/14/2022 1057 Last data filed at 06/14/2022 0900 Gross per 24 hour  Intake 3701.24 ml  Output --  Net 3701.24 ml   Filed Weights   06/10/22 2119 06/11/22 0500 06/13/22 2207  Weight: 118.1 kg 118.1 kg 130.9 kg     Examination:   Physical Exam  Gen:- Awake Alert, in no acute distress  HEENT:- [place].AT,  +ve sclera icterus Neck-Supple Neck,No JVD,.  Lungs-  CTAB , fair air movement bilaterally  CV- S1, S2 normal, RRR Abd-  +ve B.Sounds, Abd Soft, generalized-abdominal tenderness, no rebound or guarding, abdomen is distended, areas of ecchymosis on anterior abdominal wall noted, significant jaundice Extremity/Skin:- +ve  edema,   good pedal pulses , jaundiced Psych-affect is appropriate, oriented x3 Neuro-no new focal deficits, no tremors     Data Reviewed: I have personally reviewed following labs and imaging studies  CBC: Recent Labs  Lab 06/10/22 1341 06/11/22 0350 06/12/22 0446 06/13/22 0524 06/14/22 0538  WBC 8.9 15.7* 22.1* 19.8* 19.5*  NEUTROABS  --  12.9*  --   --   --  HGB 16.1* 18.0* 15.5* 14.4 12.8  HCT 45.2 52.0* 45.6 41.8 37.0  MCV 97.4 100.2* 103.9* 102.0* 101.1*  PLT 263 208 112* 102* [id number]*   Basic Metabolic Panel: Recent Labs  Lab 06/10/22 1341 06/11/22 0350 06/12/22 0446 06/13/22 0524 06/14/22 0538  NA 133* 132* 133* 131* 132*  K 3.3* 4.5 4.5 4.4 4.0  CL 100 101 105 108 105  CO2 19* 18* 20* 16* 18*  GLUCOSE 209* 221* 154* 82 68*  BUN <5* 8 18 27* 16  CREATININE 0.54 0.85 1.06* 0.94 0.70  CALCIUM 8.4* 8.2* 6.8* 6.7* 7.3*  MG  --  1.6* 1.9 1.8 2.0   GFR: Estimated Creatinine Clearance: 137.5 mL/min (by C-G formula based on SCr of 0.7 mg/dL). Liver Function Tests: Recent Labs  Lab 06/10/22 1341 06/11/22 0350 06/12/22 0446 06/13/22 0524 06/14/22 0538  AST 308* 142* 65* 59* 56*  ALT 115* 83* 45* 36 32  ALKPHOS 169* 133* 100 95 94  BILITOT 1.2 1.2 2.6* 4.5* 7.6*  PROT 8.2* 7.5 5.9* 5.9* 6.0*  ALBUMIN 3.5 3.1* 2.3* 2.2* 2.0*   Recent Labs  Lab 06/10/22 1341 06/10/22 2055  LIPASE 965* 637*   Thyroid Function Tests: Recent Labs    06/12/22 1322 06/13/22 0750  TSH 4.561*  --   FREET4  --  0.98    Recent Results (from the past 240  hour(s))  MRSA Next Gen by PCR, Nasal     Status: None   Collection Time: 06/10/22  8:29 PM   Specimen: Nasal Mucosa; Nasal Swab  Result Value Ref Range Status   MRSA by PCR Next Gen NOT DETECTED NOT DETECTED Final    Comment: (NOTE) The GeneXpert MRSA Assay (FDA approved for NASAL specimens only), is one component of a comprehensive MRSA colonization surveillance program. It is not intended to diagnose MRSA infection nor to guide or monitor treatment for MRSA infections. Test performance is not FDA approved in patients less than 42 years old. Performed at [facility], [address] [place], [place] [address]   Urine Culture (for pregnant, neutropenic or urologic patients or patients with an indwelling urinary catheter)     Status: Abnormal   Collection Time: 06/11/22  9:40 PM   Specimen: Urine, Clean Catch  Result Value Ref Range Status   Specimen Description URINE, CLEAN CATCH  Final   Special Requests NONE  Final   Culture >=100,000 COLONIES/mL ESCHERICHIA COLI (A)  Final   Report Status 06/14/2022 FINAL  Final   Organism ID, Bacteria ESCHERICHIA COLI (A)  Final      Susceptibility   Escherichia coli - MIC*    AMPICILLIN >=32 RESISTANT Resistant     CEFEPIME <=0.12 SENSITIVE Sensitive     CEFTRIAXONE <=0.25 SENSITIVE Sensitive     CIPROFLOXACIN >=4 RESISTANT Resistant     GENTAMICIN <=1 SENSITIVE Sensitive     IMIPENEM <=0.25 SENSITIVE Sensitive     NITROFURANTOIN <=16 SENSITIVE Sensitive     TRIMETH/SULFA >=320 RESISTANT Resistant     AMPICILLIN/SULBACTAM 4 SENSITIVE Sensitive     PIP/TAZO Value in next row Sensitive      <=4 SENSITIVEPerformed at [facility] [address], [place], [place] [address]    * >=100,000 COLONIES/mL ESCHERICHIA COLI  Culture, blood (Routine X 2) w Reflex to ID Panel     Status: None (Preliminary result)   Collection Time: 06/12/22  8:28 AM   Specimen: BLOOD  Result Value Ref Range Status   Specimen Description BLOOD RIGHT  HAND  Final    Special Requests   Final    BOTTLES DRAWN AEROBIC AND ANAEROBIC Blood Culture results may not be optimal due to an inadequate volume of blood received in culture bottles   Culture   Final    NO GROWTH 2 DAYS Performed at [facility], [address], [place], [place] [address]    Report Status PENDING  Incomplete    Radiology Studies: No results found.   Scheduled Meds:  Chlorhexidine Gluconate Cloth  6 each Topical Q0600   cloNIDine  0.2 mg Oral BID   folic acid  1 mg Oral Daily   heparin  5,000 Units Subcutaneous Q8H   LORazepam  0-4 mg Intravenous Q12H   metoprolol tartrate  50 mg Oral BID   multivitamin with minerals  1 tablet Oral Daily   nicotine  21 mg Transdermal Daily   pantoprazole (PROTONIX) IV  40 mg Intravenous Q12H   polyethylene glycol  17 g Oral Daily   thiamine  100 mg Oral Daily   Or   thiamine  100 mg Intravenous Daily   Continuous Infusions:  sodium chloride 150 mL/hr at 06/14/22 0643   cefTRIAXone (ROCEPHIN)  IV 200 mL/hr at 06/13/22 2133   niCARDipine Stopped (06/12/22 1107)     LOS: 3 days    Fletcher Ostermiller  [facility]  If 7PM-7AM, please contact night-coverage [email or web address] 06/14/2022, 10:57 AM

## 2022-06-22 DIAGNOSIS — E876 Hypokalemia: Secondary | ICD-10-CM | POA: Diagnosis not present

## 2022-06-22 DIAGNOSIS — R7401 Elevation of levels of liver transaminase levels: Secondary | ICD-10-CM | POA: Diagnosis not present

## 2022-06-22 DIAGNOSIS — F1020 Alcohol dependence, uncomplicated: Secondary | ICD-10-CM | POA: Diagnosis not present

## 2022-06-22 DIAGNOSIS — K7010 Alcoholic hepatitis without ascites: Secondary | ICD-10-CM | POA: Diagnosis not present

## 2022-06-22 DIAGNOSIS — K8500 Idiopathic acute pancreatitis without necrosis or infection: Secondary | ICD-10-CM | POA: Diagnosis not present

## 2022-06-22 DIAGNOSIS — R1013 Epigastric pain: Secondary | ICD-10-CM | POA: Diagnosis not present

## 2022-06-22 DIAGNOSIS — K852 Alcohol induced acute pancreatitis without necrosis or infection: Secondary | ICD-10-CM | POA: Diagnosis not present

## 2022-06-22 LAB — COMPREHENSIVE METABOLIC PANEL
ALT: 76 U/L — ABNORMAL HIGH (ref 0–44)
AST: 153 U/L — ABNORMAL HIGH (ref 15–41)
Albumin: 1.8 g/dL — ABNORMAL LOW (ref 3.5–5.0)
Alkaline Phosphatase: 157 U/L — ABNORMAL HIGH (ref 38–126)
Anion gap: 10 (ref 5–15)
BUN: 7 mg/dL (ref 6–20)
CO2: 19 mmol/L — ABNORMAL LOW (ref 22–32)
Calcium: 8 mg/dL — ABNORMAL LOW (ref 8.9–10.3)
Chloride: 107 mmol/L (ref 98–111)
Creatinine, Ser: 0.34 mg/dL — ABNORMAL LOW (ref 0.44–1.00)
GFR, Estimated: >60 mL/min (ref >60)
Glucose, Bld: 97 mg/dL (ref 70–99)
Potassium: 4.2 mmol/L (ref 3.5–5.1)
Sodium: 136 mmol/L (ref 135–145)
Total Bilirubin: 7.8 mg/dL — ABNORMAL HIGH (ref 0.3–1.2)
Total Protein: 6.2 g/dL — ABNORMAL LOW (ref 6.5–8.1)

## 2022-06-22 LAB — CBC
HCT: 33 % — ABNORMAL LOW (ref 36.0–46.0)
Hemoglobin: 11.3 g/dL — ABNORMAL LOW (ref 12.0–15.0)
MCH: 35 pg — ABNORMAL HIGH (ref 26.0–34.0)
MCHC: 34.2 g/dL (ref 30.0–36.0)
MCV: 102.2 fL — ABNORMAL HIGH (ref 80.0–100.0)
Platelets: 291 10*3/uL (ref 150–400)
RBC: 3.23 MIL/uL — ABNORMAL LOW (ref 3.87–5.11)
RDW: 16.6 % — ABNORMAL HIGH (ref 11.5–15.5)
WBC: 26.2 10*3/uL — ABNORMAL HIGH (ref 4.0–10.5)
nRBC: 0.1 % (ref 0.0–0.2)

## 2022-06-22 LAB — PROTIME-INR
INR: 1.3 — ABNORMAL HIGH (ref 0.8–1.2)
Prothrombin Time: 16.3 seconds — ABNORMAL HIGH (ref 11.4–15.2)

## 2022-06-22 MED ORDER — IBUPROFEN 400 MG PO TABS
200.0000 mg | ORAL_TABLET | Freq: Once | ORAL | Status: AC
  Administered 2022-06-22: 200 mg via ORAL
  Filled 2022-06-22: qty 1

## 2022-06-22 NOTE — Progress Notes (Addendum)
PROGRESS NOTE    Veronica Calhoun  [id number] DOB: 11/03/1985 DOA: 06/10/2022 PCP: Pcp, No   Brief Narrative:    Veronica Calhoun is a 37 y.o. female with medical history significant of alcohol dependence, tobacco use disorder, hypertension-not on medication, generalized anxiety disorder-not on medication, and more presents the ED with a chief complaint of abdominal pain.  She does have daily alcohol use and was admitted with alcohol induced acute pancreatitis.  She is also noted to have hypertensive crisis and is now requiring Cardizem drip.  She continues to have some ongoing sinus tachycardia.  It appears that she has been noncompliant with her home blood pressure medications.  CT abdomen and pelvis performed 2/7 with findings of severe pancreatitis and emphysematous cystitis.  Started on Rocephin with urine cultures showing E Coli with sensitivities pending.  She still remains tachycardic and D-dimer noted to be elevated.  She underwent further imaging on 2/11 with CT angiogram of the chest with findings of multilobar pneumonia and no PE.  CT of the abdomen demonstrating ongoing moderate to severe pancreatitis with no other acute complications.  GI following and assisting with management.    Assessment & Plan:   Principal Problem:   Alcohol induced acute pancreatitis Active Problems:   Anxiety   Alcohol use disorder, severe, dependence (HCC)   Acute pancreatitis   Hypertensive crisis   Transaminitis   Tobacco use disorder   Tachycardia   Hypokalemia  Assessment and Plan:   Alcohol induced acute severe pancreatitis-improving slowly -Appears to be a recurrent problem in the setting of her alcohol use drinks daily -Abdominal pain, transaminitis, elevated lipase -Abd US shows normal size for common bile duct and normal gall bladder -Patient is not on any medications that would be likely to cause pancreatitis -Advised on the importance of EtOH cessation -Weaned off of Dilaudid  PCA 2/15 and continue IV Dilaudid pushes as needed -Repeat CT 2/11 with moderate to severe pancreatitis with ongoing symptoms, appreciate GI evaluation -06/22/22 -Tolerated full liquids well, will advance to solids on 06/22/2022   Sinus tachycardia improved - Related to volume depletion in the setting of above, continue aggressive IV fluid -Also related to pain and some alcohol withdrawal -Patient also has not been taking her home metoprolol which has been resumed -Continue to monitor closely in the ICU -TSH elevated at 4.561, free T4 within normal limits 06/22/22 Heart rate improved with increased dose of metoprolol 50 mg twice daily -D-dimer elevated and PE study negative for PE -HR Improved with better pain control   Mild hypokalemia/hypomagnesemia -In the setting of Lasix administration -Repleted  Multilobar pneumonia possibly HCAP -Persistent leukocytosis noted -Completed IV cefepime on 06/22/2022  Emphysematous cystitis with E. coli UTI -Completed IV cefepime as above -Persistent leukocytosis noted  Tobacco use disorder - Smokes 1 pack/day - Nicotine patch ordered - Continue to monitor   Transaminitis-abdominal pain is not worse still requiring pain medications -related to EtOH and pancreatits -US shows normal CBD  - not likely to have an obstructing stone -Hold hepatotoxic agents when possible --Left is trending up ...  AST to ALT Ratio is consistent with alcoholic hepatitis -GI consult appreciated, serology and other labs are pending at this time     Latest Ref Rng & Units 06/22/2022    5:39 AM 06/21/2022    5:52 AM 06/20/2022    4:28 AM  Hepatic Function  Total Protein 6.5 - 8.1 g/dL 6.2  6.1  5.9   Albumin 3.5 - 5.0  g/dL 1.8  1.7  1.6   AST 15 - 41 U/L 153  139  105   ALT 0 - 44 U/L 76  64  45   Alk Phosphatase 38 - 126 U/L 157  135  115   Total Bilirubin 0.3 - 1.2 mg/dL 7.8  8.1  7.7     Hypertensive crisis-resolved -Adequate BP control on clonidine 0.2 mg  twice daily, metoprolol 50 mg twice daily   Alcohol use disorder, severe, dependence (HCC) -CIWA protocol -Advised on importance of cessation -Continue thiamine, folic acid and multivitamin   Anxiety --Continue benzos  Morbid Obesity- -Low calorie diet, portion control and increase physical activity discussed with patient -Body mass index is 48.93 kg/m.   DVT prophylaxis:Heparin Code Status: Full Family Communication: None at bedside Disposition Plan: Anticipate discharge to home in the next 48 hours once further improved Status is: Inpatient  Consultants:  GI  Procedures:  See below  Antimicrobials:  Anti-infectives (From admission, onward)    Start     Dose/Rate Route Frequency Ordered Stop   06/20/22 1445  ceFEPIme (MAXIPIME) 2 g in sodium chloride 0.9 % 100 mL IVPB        2 g 200 mL/hr over 30 Minutes Intravenous Every 8 hours 06/20/22 1017 06/21/22 2233   06/15/22 1430  ceFEPIme (MAXIPIME) 2 g in sodium chloride 0.9 % 100 mL IVPB  Status:  Discontinued        2 g 200 mL/hr over 30 Minutes Intravenous Every 8 hours 06/15/22 1345 06/20/22 1017   06/11/22 2145  cefTRIAXone (ROCEPHIN) 1 g in sodium chloride 0.9 % 100 mL IVPB  Status:  Discontinued        1 g 200 mL/hr over 30 Minutes Intravenous Every 24 hours 06/11/22 2053 06/15/22 1338       Subjective: --Reports 3-4 BMs in the last 24 hours -Nausea and abdominal pain persist No fever  Or chills  -No emesis  Objective: Vitals:   06/14/22 0031 06/14/22 0106 06/14/22 0532 06/14/22 0750  BP:  110/71 (!) 142/95 (!) 151/92  Pulse: (!) 113 (!) 109 (!) 110 (!) 118  Resp:  (!) 30 (!) 30 (!) 21  Temp:  99 F (37.2 C) 98.1 F (36.7 C)   TempSrc:  Oral    SpO2:  95% 96% 96%  Weight:      Height:        Intake/Output Summary (Last 24 hours) at 06/14/2022 1057 Last data filed at 06/14/2022 0900 Gross per 24 hour  Intake 3701.24 ml  Output --  Net 3701.24 ml   Filed Weights   06/10/22 2119 06/11/22 0500  06/13/22 2207  Weight: 118.1 kg 118.1 kg 130.9 kg   Physical Exam  Gen:- Awake Alert, in no acute distress  HEENT:- [place].AT,  +ve sclera icterus Neck-Supple Neck,No JVD,.  Lungs-  CTAB , fair air movement bilaterally  CV- S1, S2 normal, RRR, tachycardia Abd-  +ve B.Sounds, Abd Soft, generalized-abdominal tenderness, no rebound or guarding, abdomen is distended, areas of ecchymosis on anterior abdominal wall noted, significant jaundice noted Extremity/Skin:- +ve  edema,   good pedal pulses , jaundiced Psych-affect is appropriate, oriented x3 Neuro-generalized weakness, no new focal deficits, no tremors  Data Reviewed: I have personally reviewed following labs and imaging studies  CBC: Recent Labs  Lab 06/10/22 1341 06/11/22 0350 06/12/22 0446 06/13/22 0524 06/14/22 0538  WBC 8.9 15.7* 22.1* 19.8* 19.5*  NEUTROABS  --  12.9*  --   --   --  HGB 16.1* 18.0* 15.5* 14.4 12.8  HCT 45.2 52.0* 45.6 41.8 37.0  MCV 97.4 100.2* 103.9* 102.0* 101.1*  PLT 263 208 112* 102* [id number]*   Basic Metabolic Panel: Recent Labs  Lab 06/10/22 1341 06/11/22 0350 06/12/22 0446 06/13/22 0524 06/14/22 0538  NA 133* 132* 133* 131* 132*  K 3.3* 4.5 4.5 4.4 4.0  CL 100 101 105 108 105  CO2 19* 18* 20* 16* 18*  GLUCOSE 209* 221* 154* 82 68*  BUN <5* 8 18 27* 16  CREATININE 0.54 0.85 1.06* 0.94 0.70  CALCIUM 8.4* 8.2* 6.8* 6.7* 7.3*  MG  --  1.6* 1.9 1.8 2.0   GFR: Estimated Creatinine Clearance: 137.5 mL/min (by C-G formula based on SCr of 0.7 mg/dL). Liver Function Tests: Recent Labs  Lab 06/10/22 1341 06/11/22 0350 06/12/22 0446 06/13/22 0524 06/14/22 0538  AST 308* 142* 65* 59* 56*  ALT 115* 83* 45* 36 32  ALKPHOS 169* 133* 100 95 94  BILITOT 1.2 1.2 2.6* 4.5* 7.6*  PROT 8.2* 7.5 5.9* 5.9* 6.0*  ALBUMIN 3.5 3.1* 2.3* 2.2* 2.0*   Recent Labs  Lab 06/10/22 1341 06/10/22 2055  LIPASE 965* 637*   Thyroid Function Tests: Recent Labs    06/12/22 1322 06/13/22 0750  TSH 4.561*  --    FREET4  --  0.98    Recent Results (from the past 240 hour(s))  MRSA Next Gen by PCR, Nasal     Status: None   Collection Time: 06/10/22  8:29 PM   Specimen: Nasal Mucosa; Nasal Swab  Result Value Ref Range Status   MRSA by PCR Next Gen NOT DETECTED NOT DETECTED Final    Comment: (NOTE) The GeneXpert MRSA Assay (FDA approved for NASAL specimens only), is one component of a comprehensive MRSA colonization surveillance program. It is not intended to diagnose MRSA infection nor to guide or monitor treatment for MRSA infections. Test performance is not FDA approved in patients less than 58 years old. Performed at [facility], [address], [place], [place] [address]   Urine Culture (for pregnant, neutropenic or urologic patients or patients with an indwelling urinary catheter)     Status: Abnormal   Collection Time: 06/11/22  9:40 PM   Specimen: Urine, Clean Catch  Result Value Ref Range Status   Specimen Description URINE, CLEAN CATCH  Final   Special Requests NONE  Final   Culture >=100,000 COLONIES/mL ESCHERICHIA COLI (A)  Final   Report Status 06/14/2022 FINAL  Final   Organism ID, Bacteria ESCHERICHIA COLI (A)  Final      Susceptibility   Escherichia coli - MIC*    AMPICILLIN >=32 RESISTANT Resistant     CEFEPIME <=0.12 SENSITIVE Sensitive     CEFTRIAXONE <=0.25 SENSITIVE Sensitive     CIPROFLOXACIN >=4 RESISTANT Resistant     GENTAMICIN <=1 SENSITIVE Sensitive     IMIPENEM <=0.25 SENSITIVE Sensitive     NITROFURANTOIN <=16 SENSITIVE Sensitive     TRIMETH/SULFA >=320 RESISTANT Resistant     AMPICILLIN/SULBACTAM 4 SENSITIVE Sensitive     PIP/TAZO Value in next row Sensitive      <=4 SENSITIVEPerformed at [facility] [address], [place], [place] [address]    * >=100,000 COLONIES/mL ESCHERICHIA COLI  Culture, blood (Routine X 2) w Reflex to ID Panel     Status: None (Preliminary result)   Collection Time: 06/12/22  8:28 AM   Specimen: BLOOD  Result Value Ref  Range Status   Specimen Description BLOOD  RIGHT HAND  Final   Special Requests   Final    BOTTLES DRAWN AEROBIC AND ANAEROBIC Blood Culture results may not be optimal due to an inadequate volume of blood received in culture bottles   Culture   Final    NO GROWTH 2 DAYS Performed at [facility], [address], [place],  [address]    Report Status PENDING  Incomplete    Radiology Studies: No results found.  Scheduled Meds:  Chlorhexidine Gluconate Cloth  6 each Topical Q0600   cloNIDine  0.2 mg Oral BID   folic acid  1 mg Oral Daily   heparin  5,000 Units Subcutaneous Q8H   LORazepam  0-4 mg Intravenous Q12H   metoprolol tartrate  50 mg Oral BID   multivitamin with minerals  1 tablet Oral Daily   nicotine  21 mg Transdermal Daily   pantoprazole (PROTONIX) IV  40 mg Intravenous Q12H   polyethylene glycol  17 g Oral Daily   thiamine  100 mg Oral Daily   Or   thiamine  100 mg Intravenous Daily   Continuous Infusions:  sodium chloride 150 mL/hr at 06/14/22 0643   cefTRIAXone (ROCEPHIN)  IV 200 mL/hr at 06/13/22 2133   niCARDipine Stopped (06/12/22 1107)    LOS: 3 days   Caidence Kaseman  [facility]  If 7PM-7AM, please contact night-coverage [email or web address] 06/14/2022, 10:57 AM

## 2022-06-22 NOTE — Progress Notes (Signed)
Subjective: Patient without complaints today.  Some left upper quadrant pain though is well-controlled.  Improved compared to prior.  Having bowel movements on MiraLAX.  Objective: Vital signs in last 24 hours: Temp:  [98.2 F (36.8 C)-98.3 F (36.8 C)] 98.3 F (36.8 C) (02/18 1317) Pulse Rate:  [91-100] 91 (02/18 1317) Resp:  [18-20] 18 (02/18 1317) BP: (135-152)/(83-96) 135/90 (02/18 1317) SpO2:  [98 %-100 %] 99 % (02/18 1317) Weight:  [137.5 kg] 137.5 kg (02/18 0424) Last BM Date : 06/22/22 General:   Alert and oriented, pleasant Head:  Normocephalic and atraumatic. Eyes:  No icterus, sclera clear. Conjuctiva pink.  Abdomen:  Bowel sounds present, soft, non-tender, non-distended. No HSM or hernias noted. No rebound or guarding. No masses appreciated  Msk:  Symmetrical without gross deformities. Normal posture. Extremities:  Without clubbing or edema. Neurologic:  Alert and  oriented x4;  grossly normal neurologically. Skin:  Warm and dry, intact without significant lesions.  Cervical Nodes:  No significant cervical adenopathy. Psych:  Alert and cooperative. Normal mood and affect.  Intake/Output from previous day: 02/17 0701 - 02/18 0700 In: 240 [P.O.:240] Out: -  Intake/Output this shift: No intake/output data recorded.  Lab Results: Recent Labs    06/20/22 0428 06/21/22 0659 06/22/22 0539  WBC 27.5* 26.0* 26.2*  HGB 11.1* 10.8* 11.3*  HCT 31.8* 32.4* 33.0*  PLT 270 290 291   BMET Recent Labs    06/20/22 0428 06/21/22 0552 06/22/22 0539  NA 135 136 136  K 3.1* 4.0 4.2  CL 108 109 107  CO2 19* 18* 19*  GLUCOSE 112* 106* 97  BUN 12 10 7  $ CREATININE 0.48 0.40* 0.34*  CALCIUM 7.8* 7.9* 8.0*   LFT Recent Labs    06/20/22 0428 06/21/22 0552 06/22/22 0539  PROT 5.9* 6.1* 6.2*  ALBUMIN 1.6* 1.7* 1.8*  AST 105* 139* 153*  ALT 45* 64* 76*  ALKPHOS 115 135* 157*  BILITOT 7.7* 8.1* 7.8*   PT/INR Recent Labs    06/20/22 0428 06/22/22 0539  LABPROT  16.3* 16.3*  INR 1.3* 1.3*   Hepatitis Panel No results for input(s): "HEPBSAG", "HCVAB", "HEPAIGM", "HEPBIGM" in the last 72 hours.   Studies/Results: No results found.  Assessment: *Acute alcoholic pancreatitis *Ileus-resolved *Alcoholic hepatitis-MDF 23  Plan: Patient continues to clinically improved.  Abdominal pain well-controlled.  Tolerating full liquid diet.  Continue to advance.  Having bowel movements, continue MiraLAX 3 times daily.  Frequent ambulation.  LFTs remain elevated, slightly worse.  INR stable.  Maddrey discriminant function of 23.  No indication for steroids at this point.  Her clinical picture is classic for alcohol induced hepatitis.  That being said, will order serologies to rule out other potential causes. Viral hep panel negative.   Absolute alcohol cessation going forward.  Hopeful discharge in the next 24 hours.  Will need outpatient follow-up with GI.  Elon Alas. Abbey Chatters, D.O. Gastroenterology and Hepatology [facility]   LOS: 11 days    06/22/2022, 2:53 PM

## 2022-06-23 ENCOUNTER — Emergency Department (HOSPITAL_COMMUNITY): Payer: Medicaid Other

## 2022-06-23 ENCOUNTER — Other Ambulatory Visit: Payer: Self-pay

## 2022-06-23 ENCOUNTER — Other Ambulatory Visit: Payer: Self-pay | Admitting: *Deleted

## 2022-06-23 ENCOUNTER — Telehealth: Payer: Self-pay | Admitting: Gastroenterology

## 2022-06-23 ENCOUNTER — Inpatient Hospital Stay (HOSPITAL_COMMUNITY)
Admission: EM | Admit: 2022-06-23 | Discharge: 2022-06-26 | Disposition: A | Discharge status: Home / Self Care | Payer: Medicaid Other | Source: Home / Self Care | Attending: Internal Medicine | Admitting: Internal Medicine

## 2022-06-23 DIAGNOSIS — E871 Hypo-osmolality and hyponatremia: Secondary | ICD-10-CM | POA: Diagnosis present

## 2022-06-23 DIAGNOSIS — R7989 Other specified abnormal findings of blood chemistry: Secondary | ICD-10-CM

## 2022-06-23 DIAGNOSIS — K860 Alcohol-induced chronic pancreatitis: Secondary | ICD-10-CM | POA: Diagnosis present

## 2022-06-23 DIAGNOSIS — J918 Pleural effusion in other conditions classified elsewhere: Secondary | ICD-10-CM | POA: Diagnosis present

## 2022-06-23 DIAGNOSIS — B9620 Unspecified Escherichia coli [E. coli] as the cause of diseases classified elsewhere: Secondary | ICD-10-CM | POA: Diagnosis present

## 2022-06-23 DIAGNOSIS — J810 Acute pulmonary edema: Secondary | ICD-10-CM | POA: Diagnosis present

## 2022-06-23 DIAGNOSIS — A419 Sepsis, unspecified organism: Secondary | ICD-10-CM | POA: Diagnosis present

## 2022-06-23 DIAGNOSIS — D649 Anemia, unspecified: Secondary | ICD-10-CM | POA: Diagnosis present

## 2022-06-23 DIAGNOSIS — F411 Generalized anxiety disorder: Secondary | ICD-10-CM | POA: Diagnosis present

## 2022-06-23 DIAGNOSIS — D7589 Other specified diseases of blood and blood-forming organs: Secondary | ICD-10-CM | POA: Diagnosis present

## 2022-06-23 DIAGNOSIS — J189 Pneumonia, unspecified organism: Secondary | ICD-10-CM | POA: Diagnosis present

## 2022-06-23 DIAGNOSIS — I10 Essential (primary) hypertension: Secondary | ICD-10-CM | POA: Diagnosis present

## 2022-06-23 DIAGNOSIS — F1010 Alcohol abuse, uncomplicated: Secondary | ICD-10-CM | POA: Diagnosis present

## 2022-06-23 DIAGNOSIS — Z1152 Encounter for screening for COVID-19: Secondary | ICD-10-CM

## 2022-06-23 DIAGNOSIS — Z888 Allergy status to other drugs, medicaments and biological substances status: Secondary | ICD-10-CM

## 2022-06-23 DIAGNOSIS — R601 Generalized edema: Secondary | ICD-10-CM | POA: Diagnosis present

## 2022-06-23 DIAGNOSIS — K219 Gastro-esophageal reflux disease without esophagitis: Secondary | ICD-10-CM | POA: Diagnosis present

## 2022-06-23 DIAGNOSIS — Y95 Nosocomial condition: Secondary | ICD-10-CM | POA: Diagnosis present

## 2022-06-23 DIAGNOSIS — R7401 Elevation of levels of liver transaminase levels: Secondary | ICD-10-CM

## 2022-06-23 DIAGNOSIS — K852 Alcohol induced acute pancreatitis without necrosis or infection: Secondary | ICD-10-CM | POA: Diagnosis present

## 2022-06-23 DIAGNOSIS — Z8249 Family history of ischemic heart disease and other diseases of the circulatory system: Secondary | ICD-10-CM

## 2022-06-23 DIAGNOSIS — E8809 Other disorders of plasma-protein metabolism, not elsewhere classified: Secondary | ICD-10-CM | POA: Diagnosis present

## 2022-06-23 DIAGNOSIS — N390 Urinary tract infection, site not specified: Secondary | ICD-10-CM | POA: Diagnosis present

## 2022-06-23 DIAGNOSIS — K760 Fatty (change of) liver, not elsewhere classified: Secondary | ICD-10-CM | POA: Diagnosis present

## 2022-06-23 DIAGNOSIS — F17210 Nicotine dependence, cigarettes, uncomplicated: Secondary | ICD-10-CM | POA: Diagnosis present

## 2022-06-23 DIAGNOSIS — K761 Chronic passive congestion of liver: Secondary | ICD-10-CM | POA: Diagnosis present

## 2022-06-23 DIAGNOSIS — K567 Ileus, unspecified: Secondary | ICD-10-CM | POA: Diagnosis present

## 2022-06-23 DIAGNOSIS — E8770 Fluid overload, unspecified: Secondary | ICD-10-CM | POA: Diagnosis present

## 2022-06-23 DIAGNOSIS — K7011 Alcoholic hepatitis with ascites: Secondary | ICD-10-CM | POA: Diagnosis present

## 2022-06-23 LAB — CBC
HCT: 34.8 % — ABNORMAL LOW (ref 36.0–46.0)
Hemoglobin: 11.6 g/dL — ABNORMAL LOW (ref 12.0–15.0)
MCH: 34.1 pg — ABNORMAL HIGH (ref 26.0–34.0)
MCHC: 33.3 g/dL (ref 30.0–36.0)
MCV: 102.4 fL — ABNORMAL HIGH (ref 80.0–100.0)
Platelets: 370 10*3/uL (ref 150–400)
RBC: 3.4 MIL/uL — ABNORMAL LOW (ref 3.87–5.11)
RDW: 16.8 % — ABNORMAL HIGH (ref 11.5–15.5)
WBC: 22.7 10*3/uL — ABNORMAL HIGH (ref 4.0–10.5)
nRBC: 0 % (ref 0.0–0.2)

## 2022-06-23 LAB — COMPREHENSIVE METABOLIC PANEL
ALT: 84 U/L — ABNORMAL HIGH (ref 0–44)
ALT: 79 U/L — ABNORMAL HIGH (ref 0–44)
AST: 157 U/L — ABNORMAL HIGH (ref 15–41)
AST: 158 U/L — ABNORMAL HIGH (ref 15–41)
Albumin: 1.9 g/dL — ABNORMAL LOW (ref 3.5–5.0)
Albumin: 1.9 g/dL — ABNORMAL LOW (ref 3.5–5.0)
Alkaline Phosphatase: 205 U/L — ABNORMAL HIGH (ref 38–126)
Alkaline Phosphatase: 219 U/L — ABNORMAL HIGH (ref 38–126)
Anion gap: 6 (ref 5–15)
Anion gap: 9 (ref 5–15)
BUN: 6 mg/dL (ref 6–20)
BUN: 5 mg/dL — ABNORMAL LOW (ref 6–20)
CO2: 24 mmol/L (ref 22–32)
CO2: 20 mmol/L — ABNORMAL LOW (ref 22–32)
Calcium: 8.2 mg/dL — ABNORMAL LOW (ref 8.9–10.3)
Calcium: 8.3 mg/dL — ABNORMAL LOW (ref 8.9–10.3)
Chloride: 105 mmol/L (ref 98–111)
Chloride: 105 mmol/L (ref 98–111)
Creatinine, Ser: 0.46 mg/dL (ref 0.44–1.00)
Creatinine, Ser: 0.44 mg/dL (ref 0.44–1.00)
GFR, Estimated: >60 mL/min (ref >60)
GFR, Estimated: >60 mL/min (ref >60)
Glucose, Bld: 103 mg/dL — ABNORMAL HIGH (ref 70–99)
Glucose, Bld: 96 mg/dL (ref 70–99)
Potassium: 3.5 mmol/L (ref 3.5–5.1)
Potassium: 3.8 mmol/L (ref 3.5–5.1)
Sodium: 135 mmol/L (ref 135–145)
Sodium: 134 mmol/L — ABNORMAL LOW (ref 135–145)
Total Bilirubin: 7.2 mg/dL — ABNORMAL HIGH (ref 0.3–1.2)
Total Bilirubin: 6.3 mg/dL — ABNORMAL HIGH (ref 0.3–1.2)
Total Protein: 7 g/dL (ref 6.5–8.1)
Total Protein: 6.3 g/dL — ABNORMAL LOW (ref 6.5–8.1)

## 2022-06-23 LAB — FERRITIN: Ferritin: 705 ng/mL — ABNORMAL HIGH (ref 11–307)

## 2022-06-23 LAB — CBC WITH DIFFERENTIAL/PLATELET
Abs Immature Granulocytes: 1.25 10*3/uL — ABNORMAL HIGH (ref 0.00–0.07)
Basophils Absolute: 0.1 10*3/uL (ref 0.0–0.1)
Basophils Relative: 1 %
Eosinophils Absolute: 0.4 10*3/uL (ref 0.0–0.5)
Eosinophils Relative: 2 %
HCT: 31.8 % — ABNORMAL LOW (ref 36.0–46.0)
Hemoglobin: 10.6 g/dL — ABNORMAL LOW (ref 12.0–15.0)
Immature Granulocytes: 5 %
Lymphocytes Relative: 10 %
Lymphs Abs: 2.5 10*3/uL (ref 0.7–4.0)
MCH: 34 pg (ref 26.0–34.0)
MCHC: 33.3 g/dL (ref 30.0–36.0)
MCV: 101.9 fL — ABNORMAL HIGH (ref 80.0–100.0)
Monocytes Absolute: 1.4 10*3/uL — ABNORMAL HIGH (ref 0.1–1.0)
Monocytes Relative: 6 %
Neutro Abs: 18.6 10*3/uL — ABNORMAL HIGH (ref 1.7–7.7)
Neutrophils Relative %: 76 %
Platelets: 385 10*3/uL (ref 150–400)
RBC: 3.12 MIL/uL — ABNORMAL LOW (ref 3.87–5.11)
RDW: 16.7 % — ABNORMAL HIGH (ref 11.5–15.5)
WBC: 24.2 10*3/uL — ABNORMAL HIGH (ref 4.0–10.5)
nRBC: 0 % (ref 0.0–0.2)

## 2022-06-23 LAB — PROTIME-INR
INR: 1.3 — ABNORMAL HIGH (ref 0.8–1.2)
INR: 1.2 (ref 0.8–1.2)
Prothrombin Time: 16 seconds — ABNORMAL HIGH (ref 11.4–15.2)
Prothrombin Time: 15.3 seconds — ABNORMAL HIGH (ref 11.4–15.2)

## 2022-06-23 LAB — IRON AND TIBC
Iron: 67 ug/dL (ref 28–170)
Saturation Ratios: 34 % — ABNORMAL HIGH (ref 10.4–31.8)
TIBC: 196 ug/dL — ABNORMAL LOW (ref 250–450)
UIBC: 129 ug/dL

## 2022-06-23 LAB — LIPASE, BLOOD: Lipase: 46 U/L (ref 11–51)

## 2022-06-23 MED ORDER — CLONIDINE HCL 0.2 MG PO TABS
0.2000 mg | ORAL_TABLET | Freq: Two times a day (BID) | ORAL | Status: DC
  Administered 2022-06-24 – 2022-06-26 (×6): 0.2 mg via ORAL
  Filled 2022-06-23: qty 2
  Filled 2022-06-23 (×3): qty 1
  Filled 2022-06-23: qty 2
  Filled 2022-06-23: qty 1

## 2022-06-23 MED ORDER — PANTOPRAZOLE SODIUM 40 MG PO TBEC: 40.0000 mg | DELAYED_RELEASE_TABLET | Freq: Every day | ORAL | 1 refills | Status: DC

## 2022-06-23 MED ORDER — GUAIFENESIN-DM 100-10 MG/5ML PO SYRP: 15.0000 mL | ORAL_SOLUTION | ORAL | Status: DC | PRN

## 2022-06-23 MED ORDER — FOLIC ACID 1 MG PO TABS
1.0000 mg | ORAL_TABLET | Freq: Every day | ORAL | Status: DC
  Administered 2022-06-24 – 2022-06-26 (×3): 1 mg via ORAL
  Filled 2022-06-23 (×3): qty 1

## 2022-06-23 MED ORDER — SODIUM CHLORIDE 0.9 % IV SOLN
2.0000 g | Freq: Three times a day (TID) | INTRAVENOUS | Status: DC
  Administered 2022-06-24 – 2022-06-26 (×7): 2 g via INTRAVENOUS
  Filled 2022-06-23 (×7): qty 12.5

## 2022-06-23 MED ORDER — ACETAMINOPHEN 325 MG PO TABS: 650.0000 mg | ORAL_TABLET | Freq: Four times a day (QID) | ORAL | Status: DC | PRN

## 2022-06-23 MED ORDER — ENOXAPARIN SODIUM 40 MG/0.4ML IJ SOSY
40.0000 mg | PREFILLED_SYRINGE | INTRAMUSCULAR | Status: DC
  Administered 2022-06-24 – 2022-06-25 (×3): 40 mg via SUBCUTANEOUS
  Filled 2022-06-23 (×3): qty 0.4

## 2022-06-23 MED ORDER — ALBUTEROL SULFATE (2.5 MG/3ML) 0.083% IN NEBU: 2.0000 mL | INHALATION_SOLUTION | Freq: Four times a day (QID) | RESPIRATORY_TRACT | Status: DC | PRN

## 2022-06-23 MED ORDER — VANCOMYCIN HCL 10 G IV SOLR
2000.0000 mg | Freq: Once | INTRAVENOUS | Status: DC
  Filled 2022-06-23: qty 20

## 2022-06-23 MED ORDER — POLYETHYLENE GLYCOL 3350 17 G PO PACK: 17.0000 g | PACK | Freq: Every day | ORAL | 0 refills | Status: DC | PRN

## 2022-06-23 MED ORDER — ONDANSETRON HCL 4 MG PO TABS: 4.0000 mg | ORAL_TABLET | Freq: Four times a day (QID) | ORAL | Status: DC | PRN

## 2022-06-23 MED ORDER — METOPROLOL TARTRATE 50 MG PO TABS: 50.0000 mg | ORAL_TABLET | Freq: Two times a day (BID) | ORAL | 1 refills | Status: DC

## 2022-06-23 MED ORDER — SODIUM CHLORIDE 0.9 % IV SOLN
500.0000 mg | INTRAVENOUS | Status: DC
  Administered 2022-06-24 (×2): 500 mg via INTRAVENOUS
  Filled 2022-06-23 (×2): qty 5

## 2022-06-23 MED ORDER — METOPROLOL TARTRATE 50 MG PO TABS
50.0000 mg | ORAL_TABLET | Freq: Two times a day (BID) | ORAL | Status: DC
  Administered 2022-06-24 – 2022-06-26 (×6): 50 mg via ORAL
  Filled 2022-06-23: qty 1
  Filled 2022-06-23 (×2): qty 2
  Filled 2022-06-23 (×3): qty 1

## 2022-06-23 MED ORDER — VITAMIN B-1 100 MG PO TABS: 100.0000 mg | ORAL_TABLET | Freq: Every day | ORAL | 0 refills | Status: DC

## 2022-06-23 MED ORDER — SODIUM CHLORIDE 0.9 % IV SOLN: 2.0000 g | Freq: Once | INTRAVENOUS | Status: DC

## 2022-06-23 MED ORDER — ONDANSETRON HCL 4 MG/2ML IJ SOLN: 4.0000 mg | Freq: Four times a day (QID) | INTRAMUSCULAR | Status: DC | PRN

## 2022-06-23 MED ORDER — FUROSEMIDE 10 MG/ML IJ SOLN
40.0000 mg | Freq: Once | INTRAMUSCULAR | Status: AC
  Administered 2022-06-23: 40 mg via INTRAVENOUS
  Filled 2022-06-23: qty 4

## 2022-06-23 MED ORDER — HYDROMORPHONE HCL 1 MG/ML IJ SOLN
1.0000 mg | Freq: Once | INTRAMUSCULAR | Status: AC
  Administered 2022-06-23: 1 mg via INTRAVENOUS
  Filled 2022-06-23: qty 1

## 2022-06-23 MED ORDER — MAGNESIUM HYDROXIDE 400 MG/5ML PO SUSP: 30.0000 mL | Freq: Every day | ORAL | Status: DC | PRN

## 2022-06-23 MED ORDER — CLONIDINE HCL 0.2 MG PO TABS: 0.2000 mg | ORAL_TABLET | Freq: Two times a day (BID) | ORAL | 1 refills | Status: DC

## 2022-06-23 MED ORDER — GUAIFENESIN-DM 100-10 MG/5ML PO SYRP: 15.0000 mL | ORAL_SOLUTION | ORAL | 0 refills | Status: DC | PRN

## 2022-06-23 MED ORDER — TRAZODONE HCL 50 MG PO TABS
25.0000 mg | ORAL_TABLET | Freq: Every evening | ORAL | Status: DC | PRN
  Administered 2022-06-24: 25 mg via ORAL
  Filled 2022-06-23: qty 1

## 2022-06-23 MED ORDER — ACETAMINOPHEN 650 MG RE SUPP: 650.0000 mg | Freq: Four times a day (QID) | RECTAL | Status: DC | PRN

## 2022-06-23 MED ORDER — THIAMINE MONONITRATE 100 MG PO TABS
100.0000 mg | ORAL_TABLET | Freq: Every day | ORAL | Status: DC
  Administered 2022-06-24 – 2022-06-26 (×3): 100 mg via ORAL
  Filled 2022-06-23 (×3): qty 1

## 2022-06-23 MED ORDER — FOLIC ACID 1 MG PO TABS: 1.0000 mg | ORAL_TABLET | Freq: Every day | ORAL | 0 refills | Status: AC

## 2022-06-23 MED ORDER — SODIUM CHLORIDE 0.9 % IV SOLN
2.0000 g | Freq: Once | INTRAVENOUS | Status: AC
  Administered 2022-06-23: 2 g via INTRAVENOUS
  Filled 2022-06-23: qty 12.5

## 2022-06-23 MED ORDER — ALBUTEROL SULFATE HFA 108 (90 BASE) MCG/ACT IN AERS: 2.0000 | INHALATION_SPRAY | Freq: Four times a day (QID) | RESPIRATORY_TRACT | 2 refills | Status: DC | PRN

## 2022-06-23 MED ORDER — VANCOMYCIN HCL 2000 MG/400ML IV SOLN
2000.0000 mg | Freq: Once | INTRAVENOUS | Status: AC
  Administered 2022-06-24: 2000 mg via INTRAVENOUS
  Filled 2022-06-23: qty 400

## 2022-06-23 MED ORDER — POLYETHYLENE GLYCOL 3350 17 G PO PACK: 17.0000 g | PACK | Freq: Every day | ORAL | Status: DC | PRN

## 2022-06-23 MED ORDER — VANCOMYCIN HCL IN DEXTROSE 1-5 GM/200ML-% IV SOLN: 1000.0000 mg | Freq: Once | INTRAVENOUS | Status: DC

## 2022-06-23 MED ORDER — PANTOPRAZOLE SODIUM 40 MG PO TBEC
40.0000 mg | DELAYED_RELEASE_TABLET | Freq: Every day | ORAL | Status: DC
  Administered 2022-06-24 – 2022-06-26 (×3): 40 mg via ORAL
  Filled 2022-06-23 (×4): qty 1

## 2022-06-23 MED ORDER — FUROSEMIDE 10 MG/ML IJ SOLN
40.0000 mg | Freq: Two times a day (BID) | INTRAMUSCULAR | Status: DC
  Administered 2022-06-24 – 2022-06-26 (×5): 40 mg via INTRAVENOUS
  Filled 2022-06-23 (×5): qty 4

## 2022-06-23 NOTE — Progress Notes (Signed)
Patient has c/o a headache. Pain 7/10. MD Zierle-Ghosh notified. Received order for one time dose of 200 mg Advil. Medication given. Will continue to monitor.

## 2022-06-23 NOTE — Telephone Encounter (Signed)
Informed pt to have labs completed next week. Pt voiced understanding. Labs entered Epic. Mailed lab requisitions

## 2022-06-23 NOTE — Telephone Encounter (Signed)
Noted  

## 2022-06-23 NOTE — Progress Notes (Signed)
Patient has called out several times this shift requesting her IV pain medication, PRN doses given as order allowed. Patient has ranged from a 7-8 on the pain scale. Patient has confusion about when her dilaudid is due. Patient will call out requesting more an hour after receiving the last dose. Reminded patient that the order is for every 3 hours. Patient ambulated to the scale for her daily weight. Tolerated well. Patient scoring 0 on CIWA scale.

## 2022-06-23 NOTE — Discharge Summary (Signed)
Physician Discharge Summary  Veronica Calhoun [id number] DOB: 1985/07/17 DOA: 06/10/2022  PCP: Pcp, No  Admit date: 06/10/2022  Discharge date: 06/23/2022  Admitted From:Home  Disposition:  Home  Recommendations for Outpatient Follow-up:  Follow up with PCP in 1-2 weeks Follow-up with gastroenterology which will be scheduled as outpatient in the next 2-4 weeks Continue Protonix daily Continue MiraLAX as needed for constipation Continue on clonidine and metoprolol as prescribed and follow-up outpatient Counseled on alcohol cessation and resources will be provided  Home Health: None  Equipment/Devices: None  Discharge Condition:Stable  CODE STATUS: Full  Diet recommendation: Heart Healthy  Brief/Interim Summary:  Veronica Calhoun is a 37 y.o. female with medical history significant of alcohol dependence, tobacco use disorder, hypertension-not on medication, generalized anxiety disorder-not on medication, and more presents the ED with a chief complaint of abdominal pain.  She was admitted with alcohol induced acute pancreatitis that was moderate to severe in intensity.  She was also noted to have hypertensive crisis and required Cardizem drip with some initial sinus tachycardia noted.  This was changed to Cardene drip and her blood pressures have improved with the use of clonidine and metoprolol which she was previously on.  She was noncompliant with her home blood pressure medications.  CT of the abdomen pelvis was repeated during the course of hospitalization demonstrating moderate pancreatitis with edema and no other acute findings.  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 coli.  She ultimately also had healthcare associated pneumonia and was switched to cefepime and completed a 7-day course of treatment with improvement noted.  CT angiogram of the chest with no findings of PE noted and gastroenterology was  following patient with slow dietary advancement which she is now tolerating.  She is now stable for discharge.  Discharge Diagnoses:  Principal Problem:   Alcohol induced acute pancreatitis Active Problems:   Anxiety   Alcohol use disorder, severe, dependence (HCC)   Acute pancreatitis   Hypertensive crisis   Transaminitis   Tobacco use disorder   Tachycardia   Hypokalemia   Alcoholic hepatitis with ascites   Abdominal pain, epigastric   Ileus (HCC)   Alcoholic hepatitis without ascites   Anasarca  Principal discharge diagnosis: Acute pancreatitis-alcohol induced.  E. coli UTI and healthcare associated pneumonia.  Discharge Instructions  Discharge Instructions     Ambulatory referral to Gastroenterology   Complete by: As directed    pancreatitis   What is the reason for referral?: Other   Diet - low sodium heart healthy   Complete by: As directed    Increase activity slowly   Complete by: As directed       Allergies as of 06/23/2022       Reactions   Lisinopril Swelling   Reports Lip swelling   Amlodipine Other (See Comments)   "like I was going to pass out"        Medication List     STOP taking these medications    BC HEADACHE POWDER PO       TAKE these medications    albuterol 108 (90 Base) MCG/ACT inhaler Commonly known as: VENTOLIN HFA Inhale 2 puffs into the lungs every 6 (six) hours as needed for wheezing or shortness of breath.   cloNIDine 0.2 MG tablet Commonly known as: CATAPRES Take 1 tablet (0.2 mg total) by mouth 2 (two) times daily.   folic acid 1 MG tablet Commonly known as:  FOLVITE Take 1 tablet (1 mg total) by mouth daily.   guaiFENesin-dextromethorphan 100-10 MG/5ML syrup Commonly known as: ROBITUSSIN DM Take 15 mLs by mouth every 4 (four) hours as needed for cough.   metoprolol tartrate 50 MG tablet Commonly known as: LOPRESSOR Take 1 tablet (50 mg total) by mouth 2 (two) times daily.   pantoprazole 40 MG  tablet Commonly known as: Protonix Take 1 tablet (40 mg total) by mouth daily.   polyethylene glycol 17 g packet Commonly known as: MiraLax Take 17 g by mouth daily as needed for mild constipation, moderate constipation or severe constipation.   thiamine 100 MG tablet Commonly known as: Vitamin B-1 Take 1 tablet (100 mg total) by mouth daily.        Follow-up Information     [facility]. Go to.   Contact information: [place] [address] [phone]               Allergies  Allergen Reactions   Lisinopril Swelling    Reports Lip swelling   Amlodipine Other (See Comments)    "like I was going to pass out"    Consultations: GI   Procedures/Studies: DG Abd 1 View  Result Date: 06/17/2022 CLINICAL DATA:  Abdominal distention EXAM: ABDOMEN - 1 VIEW COMPARISON:  None Available. FINDINGS: Gaseous distention of the colon. Scattered nondilated gas-filled loops of small bowel. No radio-opaque calculi or other significant radiographic abnormality are seen. IMPRESSION: Gaseous distention of the colon, likely due to ileus. Electronically Signed   By: Yetta Glassman M.D.   On: 06/17/2022 13:27   CT Angio Chest Pulmonary Embolism (PE) W or WO Contrast  Result Date: 06/15/2022 CLINICAL DATA:  Tachycardia. Elevated D-dimer. Alcohol-induced acute pancreatitis. EXAM: CT ANGIOGRAPHY CHEST CT ABDOMEN AND PELVIS WITH CONTRAST TECHNIQUE: Multidetector CT imaging of the chest was performed using the standard protocol during bolus administration of intravenous contrast. Multiplanar CT image reconstructions and MIPs were obtained to evaluate the vascular anatomy. Multidetector CT imaging of the abdomen and pelvis was performed using the standard protocol during bolus administration of intravenous contrast. RADIATION DOSE REDUCTION: This exam was performed according to the departmental dose-optimization program which includes automated  exposure control, adjustment of the mA and/or kV according to patient size and/or use of iterative reconstruction technique. CONTRAST:  165m OMNIPAQUE IOHEXOL 350 MG/ML SOLN COMPARISON:  AP CT on 06/11/2022 FINDINGS: CTA CHEST FINDINGS Cardiovascular: Satisfactory opacification of pulmonary arteries noted, and no pulmonary emboli identified. No evidence of thoracic aortic dissection or aneurysm. Mediastinum/Nodes: No masses or pathologically enlarged lymph nodes identified. Lungs/Pleura: Airspace disease is seen in both upper lobes and right middle lobe, suspicious for pneumonia. Left lower lobe atelectasis and tiny left pleural effusion noted. Musculoskeletal: No suspicious bone lesions identified. Review of the MIP images confirms the above findings. CT ABDOMEN and PELVIS FINDINGS Hepatobiliary: No hepatic masses identified. Moderate diffuse hepatic steatosis again noted. Gallbladder is unremarkable. No evidence of biliary ductal dilatation. Pancreas: Moderate to severe acute pancreatitis shows no significant change. No evidence of pancreatic necrosis or mass. Evidence of pancreatic ductal dilatation. Moderate peripancreatic fluid is seen which tracts inferiorly into the pelvis. This shows mild decrease since previous study. No pseudocysts identified. Spleen: Within normal limits in size and appearance. Adrenals/Urinary Tract: No suspicious masses identified. No evidence of ureteral calculi or hydronephrosis. Stomach/Bowel: Reactive wall thickening is again seen involving the stomach secondary to acute pancreatitis. Increased diffuse colonic dilatation is seen, consistent with ileus. Vascular/Lymphatic: No pathologically  enlarged lymph nodes. No acute vascular findings. Reproductive:  No mass or other significant abnormality. Other:  Increased diffuse body wall edema. Musculoskeletal:  No suspicious bone lesions identified. Review of the MIP images confirms the above findings. IMPRESSION: No evidence of pulmonary  embolism. Airspace disease in both upper lobes and right middle lobe, suspicious for pneumonia. Left lower lobe atelectasis and tiny left pleural effusion. Moderate to severe acute pancreatitis, with mild decrease in peripancreatic fluid and ascites. No evidence of pancreatic necrosis or pseudocysts. Worsening colonic ileus. Increased diffuse body wall edema. Stable hepatic steatosis. Electronically Signed   By: Marlaine Hind M.D.   On: 06/15/2022 13:19   CT ABDOMEN PELVIS W CONTRAST  Result Date: 06/15/2022 CLINICAL DATA:  Tachycardia. Elevated D-dimer. Alcohol-induced acute pancreatitis. EXAM: CT ANGIOGRAPHY CHEST CT ABDOMEN AND PELVIS WITH CONTRAST TECHNIQUE: Multidetector CT imaging of the chest was performed using the standard protocol during bolus administration of intravenous contrast. Multiplanar CT image reconstructions and MIPs were obtained to evaluate the vascular anatomy. Multidetector CT imaging of the abdomen and pelvis was performed using the standard protocol during bolus administration of intravenous contrast. RADIATION DOSE REDUCTION: This exam was performed according to the departmental dose-optimization program which includes automated exposure control, adjustment of the mA and/or kV according to patient size and/or use of iterative reconstruction technique. CONTRAST:  153m OMNIPAQUE IOHEXOL 350 MG/ML SOLN COMPARISON:  AP CT on 06/11/2022 FINDINGS: CTA CHEST FINDINGS Cardiovascular: Satisfactory opacification of pulmonary arteries noted, and no pulmonary emboli identified. No evidence of thoracic aortic dissection or aneurysm. Mediastinum/Nodes: No masses or pathologically enlarged lymph nodes identified. Lungs/Pleura: Airspace disease is seen in both upper lobes and right middle lobe, suspicious for pneumonia. Left lower lobe atelectasis and tiny left pleural effusion noted. Musculoskeletal: No suspicious bone lesions identified. Review of the MIP images confirms the above findings. CT  ABDOMEN and PELVIS FINDINGS Hepatobiliary: No hepatic masses identified. Moderate diffuse hepatic steatosis again noted. Gallbladder is unremarkable. No evidence of biliary ductal dilatation. Pancreas: Moderate to severe acute pancreatitis shows no significant change. No evidence of pancreatic necrosis or mass. Evidence of pancreatic ductal dilatation. Moderate peripancreatic fluid is seen which tracts inferiorly into the pelvis. This shows mild decrease since previous study. No pseudocysts identified. Spleen: Within normal limits in size and appearance. Adrenals/Urinary Tract: No suspicious masses identified. No evidence of ureteral calculi or hydronephrosis. Stomach/Bowel: Reactive wall thickening is again seen involving the stomach secondary to acute pancreatitis. Increased diffuse colonic dilatation is seen, consistent with ileus. Vascular/Lymphatic: No pathologically enlarged lymph nodes. No acute vascular findings. Reproductive:  No mass or other significant abnormality. Other:  Increased diffuse body wall edema. Musculoskeletal:  No suspicious bone lesions identified. Review of the MIP images confirms the above findings. IMPRESSION: No evidence of pulmonary embolism. Airspace disease in both upper lobes and right middle lobe, suspicious for pneumonia. Left lower lobe atelectasis and tiny left pleural effusion. Moderate to severe acute pancreatitis, with mild decrease in peripancreatic fluid and ascites. No evidence of pancreatic necrosis or pseudocysts. Worsening colonic ileus. Increased diffuse body wall edema. Stable hepatic steatosis. Electronically Signed   By: JMarlaine HindM.D.   On: 06/15/2022 13:19   CT ABDOMEN PELVIS W CONTRAST  Result Date: 06/11/2022 CLINICAL DATA:  Pancreatitis, abdominal pain, alcohol abuse EXAM: CT ABDOMEN AND PELVIS WITH CONTRAST TECHNIQUE: Multidetector CT imaging of the abdomen and pelvis was performed using the standard protocol following bolus administration of  intravenous contrast. RADIATION DOSE REDUCTION: This exam was performed according to  the departmental dose-optimization program which includes automated exposure control, adjustment of the mA and/or kV according to patient size and/or use of iterative reconstruction technique. CONTRAST:  144m OMNIPAQUE IOHEXOL 300 MG/ML  SOLN COMPARISON:  06/10/2022, 12/28/2021 FINDINGS: Lower chest: Hypoventilatory changes at the lung bases. Trace left pleural effusion. Hepatobiliary: Hepatic steatosis. Stable hepatomegaly. No biliary duct dilation. The gallbladder is unremarkable. Pancreas: Heterogeneous decreased enhancement within the head and uncinate process of the pancreas consistent with interstitial edema and underlying pancreatitis. There is marked peripancreatic fat stranding and free fluid. No organized fluid collection, abscess, or pseudocyst at this time. No pancreatic duct dilation. Spleen: Normal in size without focal abnormality. Adrenals/Urinary Tract: Punctate less than 2 mm nonobstructing left renal calculus. No obstructive uropathy within either kidney. The adrenals are grossly normal. The bladder is moderately distended, with intramural gas seen throughout the dependent portion of the gallbladder concerning for emphysematous cystitis. A small amount of intraluminal gas within the bladder may reflect recent catheterization. Please correlate with urinalysis. Stomach/Bowel: No bowel obstruction or ileus. There is secondary wall thickening of the duodenum adjacent to the acute pancreatitis described above. No other wall thickening. Vascular/Lymphatic: No significant vascular findings are present. No enlarged abdominal or pelvic lymph nodes. Reproductive: Uterus and bilateral adnexa are unremarkable. Other: Small volume ascites throughout the lower abdomen and pelvis. No free intraperitoneal gas. No abdominal wall hernia. Musculoskeletal: No acute or destructive bony lesions. Reconstructed images demonstrate no  additional findings. IMPRESSION: 1. Severe acute pancreatitis, with edematous changes throughout the head and uncinate process of the pancreas. No evidence of necrosis, fluid collection, abscess, or pseudocyst at this time. 2. Intramural gas within the posterior bladder wall, compatible with emphysematous cystitis. Please correlate with urinalysis. 3. Lower abdominal and pelvic ascites. 4. Trace left pleural effusion. 5. Stable hepatic steatosis. 6. Punctate less than 2 mm nonobstructing left renal calculus. These results will be called to the ordering clinician or representative by the Radiologist Assistant, and communication documented in the PACS or [employer] Electronically Signed   By: MRanda NgoM.D.   On: 06/11/2022 20:29   UKoreaAbdomen Complete  Result Date: 06/10/2022 CLINICAL DATA:  Epigastric abdominal pain since this morning. EXAM: ABDOMEN ULTRASOUND COMPLETE COMPARISON:  CT scan 12/28/2021 FINDINGS: Gallbladder: No gallstones or wall thickening visualized. No sonographic Murphy sign noted by sonographer. Common bile duct: Diameter: 5.0 mm Liver: There is diffuse increased echogenicity of the liver and decreased through transmission consistent with fatty infiltration. No focal lesions or biliary dilatation. Portal vein is patent on color Doppler imaging with normal direction of blood flow towards the liver. IVC: Normal caliber Pancreas: Visualized portion unremarkable. Spleen: Not well visualize. Right Kidney: Length: 13.4 cm. Normal renal cortical thickness and echogenicity without focal lesions or hydronephrosis. Left Kidney: Length: 11.4 cm. Normal renal cortical thickness and echogenicity without focal lesions or hydronephrosis. Abdominal aorta: Poorly visualized. Other findings: No ascites IMPRESSION: 1. Diffuse fatty infiltration liver but no hepatic lesions or intrahepatic biliary dilatation. 2. Normal gallbladder. 3. Poorly visualized pancreas, spleen and aorta. 4. Normal kidneys.  No  ascites. Electronically Signed   By: PMarijo SanesM.D.   On: 06/10/2022 16:46     Discharge Exam: Vitals:   06/23/22 0342 06/23/22 0503  BP: 122/80 121/85  Pulse: 82 84  Resp:  18  Temp:  98 F (36.7 C)  SpO2:  100%   Vitals:   06/22/22 2052 06/23/22 0041 06/23/22 0342 06/23/22 0503  BP: (!) 159/98 136/83 122/80 121/85  Pulse: 98 82 82 84  Resp: 18   18  Temp: 98.6 F (37 C)   98 F (36.7 C)  TempSrc:      SpO2: 100%   100%  Weight:    (!) 137.5 kg  Height:        General: Veronica Calhoun is alert, awake, not in acute distress Cardiovascular: RRR, S1/S2 +, no rubs, no gallops Respiratory: CTA bilaterally, no wheezing, no rhonchi Abdominal: Soft, NT, ND, bowel sounds + Extremities: no edema, no cyanosis    The results of significant diagnostics from this hospitalization (including imaging, microbiology, ancillary and laboratory) are listed below for reference.     Microbiology: No results found for this or any previous visit (from the past 240 hour(s)).   Labs: BNP (last 3 results) No results for input(s): "BNP" in the last 8760 hours. Basic Metabolic Panel: Recent Labs  Lab 06/18/22 0436 06/19/22 0753 06/20/22 0428 06/21/22 0552 06/22/22 0539 06/23/22 0527  NA 137 134* 135 136 136 135  K 3.6 3.7 3.1* 4.0 4.2 3.5  CL 111 109 108 109 107 105  CO2 20* 17* 19* 18* 19* 24  GLUCOSE 96 107* 112* 106* 97 103*  BUN 16 13 12 10 7 6  $ CREATININE 0.54 0.49 0.48 0.40* 0.34* 0.46  CALCIUM 8.0* 7.7* 7.8* 7.9* 8.0* 8.2*  MG 2.4 2.0  --  1.9  --   --    Liver Function Tests: Recent Labs  Lab 06/19/22 0753 06/20/22 0428 06/21/22 0552 06/22/22 0539 06/23/22 0527  AST 83* 105* 139* 153* 157*  ALT 38 45* 64* 76* 84*  ALKPHOS 113 115 135* 157* 205*  BILITOT 8.1* 7.7* 8.1* 7.8* 7.2*  PROT 6.2* 5.9* 6.1* 6.2* 7.0  ALBUMIN 1.7* 1.6* 1.7* 1.8* 1.9*   No results for input(s): "LIPASE", "AMYLASE" in the last 168 hours. No results for input(s): "AMMONIA" in the last 168  hours. CBC: Recent Labs  Lab 06/19/22 0753 06/20/22 0428 06/21/22 0659 06/22/22 0539 06/23/22 0527  WBC 31.2* 27.5* 26.0* 26.2* 22.7*  NEUTROABS  --  19.8*  --   --   --   HGB 10.9* 11.1* 10.8* 11.3* 11.6*  HCT 31.3* 31.8* 32.4* 33.0* 34.8*  MCV 99.7 100.3* 102.2* 102.2* 102.4*  PLT 256 270 290 291 370   Cardiac Enzymes: No results for input(s): "CKTOTAL", "CKMB", "CKMBINDEX", "TROPONINI" in the last 168 hours. BNP: Invalid input(s): "POCBNP" CBG: No results for input(s): "GLUCAP" in the last 168 hours. D-Dimer No results for input(s): "DDIMER" in the last 72 hours. Hgb A1c No results for input(s): "HGBA1C" in the last 72 hours. Lipid Profile No results for input(s): "CHOL", "HDL", "LDLCALC", "TRIG", "CHOLHDL", "LDLDIRECT" in the last 72 hours. Thyroid function studies No results for input(s): "TSH", "T4TOTAL", "T3FREE", "THYROIDAB" in the last 72 hours.  Invalid input(s): "FREET3" Anemia work up Recent Labs    06/23/22 0527  FERRITIN 705*  TIBC 196*  IRON 67   Urinalysis    Component Value Date/Time   COLORURINE AMBER (A) 06/11/2022 0120   APPEARANCEUR CLOUDY (A) 06/11/2022 0120   LABSPEC 1.023 06/11/2022 0120   PHURINE 5.0 06/11/2022 0120   GLUCOSEU 50 (A) 06/11/2022 0120   HGBUR LARGE (A) 06/11/2022 0120   BILIRUBINUR NEGATIVE 06/11/2022 0120   BILIRUBINUR neg 09/02/2017 1457   KETONESUR NEGATIVE 06/11/2022 0120   PROTEINUR >=300 (A) 06/11/2022 0120   UROBILINOGEN 0.2 10/16/2019 1500   NITRITE NEGATIVE 06/11/2022 0120   LEUKOCYTESUR NEGATIVE 06/11/2022 0120   Sepsis Labs  Recent Labs  Lab 06/20/22 0428 06/21/22 0659 06/22/22 0539 06/23/22 0527  WBC 27.5* 26.0* 26.2* 22.7*   Microbiology No results found for this or any previous visit (from the past 240 hour(s)).   Time coordinating discharge: 35 minutes  SIGNED:   Rodena Goldmann, DO [facility] 06/23/2022, 9:48 AM  If 7PM-7AM, please contact night-coverage [email or web address]

## 2022-06-23 NOTE — Progress Notes (Signed)
Subjective: Feeling fairly well.  No abdominal pain on the left side, but better compared to admission and well-controlled with pain medications.  Tolerating her diet well.  No nausea or vomiting.  She is having multiple bowel movements.  Had 5 total yesterday.  No BRBPR or melena.  States she is "done" with alcohol.  Objective: Vital signs in last 24 hours: Temp:  [98 F (36.7 C)-98.6 F (37 C)] 98 F (36.7 C) (02/19 0503) Pulse Rate:  [82-100] 84 (02/19 0503) Resp:  [18] 18 (02/19 0503) BP: (121-159)/(80-98) 121/85 (02/19 0503) SpO2:  [99 %-100 %] 100 % (02/19 0503) Weight:  [137.5 kg] 137.5 kg (02/19 0503) Last BM Date : 06/22/22 General:   Alert and oriented, pleasant Head:  Normocephalic and atraumatic. Eyes:  +scleral Abdomen:  Bowel sounds present, soft, non-distended. Mild TTP in LUQ and LLQ. No HSM or hernias noted. No rebound or guarding. No masses appreciated. Pitting edema in lower abdomen and flanks.  Extremities:  With 1+ pitting edema up to hips.  Neurologic:  Alert and  oriented x4;  grossly normal neurologically. Skin:  Warm and dry, intact without significant lesions.  Cervical Nodes:  No significant cervical adenopathy. Psych: Normal mood and affect.  Intake/Output from previous day: 02/18 0701 - 02/19 0700 In: 360 [P.O.:360] Out: -  Intake/Output this shift: No intake/output data recorded.  Lab Results: Recent Labs    06/21/22 0659 06/22/22 0539 06/23/22 0527  WBC 26.0* 26.2* 22.7*  HGB 10.8* 11.3* 11.6*  HCT 32.4* 33.0* 34.8*  PLT 290 291 370   BMET Recent Labs    06/21/22 0552 06/22/22 0539 06/23/22 0527  NA 136 136 135  K 4.0 4.2 3.5  CL 109 107 105  CO2 18* 19* 24  GLUCOSE 106* 97 103*  BUN 10 7 6  $ CREATININE 0.40* 0.34* 0.46  CALCIUM 7.9* 8.0* 8.2*   LFT Recent Labs    06/21/22 0552 06/22/22 0539 06/23/22 0527  PROT 6.1* 6.2* 7.0  ALBUMIN 1.7* 1.8* 1.9*  AST 139* 153* 157*  ALT 64* 76* 84*  ALKPHOS 135* 157* 205*   BILITOT 8.1* 7.8* 7.2*   PT/INR Recent Labs    06/22/22 0539 06/23/22 0527  LABPROT 16.3* 16.0*  INR 1.3* 1.3*    Assessment: 37 y.o. female with a history of anxiety, HTN, alcohol abuse currently admitted with acute pancreatitis secondary to alcohol, pneumonia, and emphysematous cystitis with E.Coli UTI. GI consulted for pancreatitis management.  Acute ETOH pancreatitis:  No cholelithiasis, hypercalcemia, or significant hypertriglyceridemia. She has been slowing improving this admission. Tolerating a regular diet at this time. She has continued to require dilaudid for pain control, but this is slowly being weaned back, currently receiving 0.5 mg every 3 hrs.    Alcoholic hepatitis/elevated LFTs:   Ultrasound 2/6 with fatty liver, no hepatic lesions or biliary dilation.  CT 2/11 again with hepatic steatosis, unremarkable gallbladder and no biliary ductal dilation.  LFTs initially improving, but started slowly increasing on 2/16. Bilirubin peaked on 2/14 and has been slowly improving. Today AST 157, ALT 84, alk phos 205, total bilirubin 7.2. Acute hepatitis panel negative.  Additional serologies ordered yesterday with ferritin elevated at 705, saturation 34%, iron 67.  This could be acute phase reactant and influenced by chronic alcohol use. Immunoglobulins, ASMA, ANA in process. Overall,  suspect slight increase in LFTs this admission likely multifactorial in the setting of ETOH hepatitis, and other acute illness. MELD Na 19. DF 21. There has been no  indication for steroids this admission. Recommend continuing to monitor CMP and INR daily. Follow-up on pending serologies. Will add AMA and hemochromatosis DNA. Will need follow-up outpatient.  Ileus:  Improved/resolved with MiraLAX and lactulose. Having multiple bowel movements daily. Currently taking Lactulose only. Would prefer MiraLAX at home.    Plan: AMA, hemochromatosis DNA Follow-up on pending serologies.  Continue to trend LFTs and  INR daily.  Alcohol cessation.  Continue PPI BID.  Continue to taper narcotic use.  Recommend MiraLAX BID-TID at discharge.  Patient is getting discharged today. Will arrange outpatient follow-up.     LOS: 12 days    06/23/2022, 7:56 AM   Aliene Altes, PA-C [facility]

## 2022-06-23 NOTE — H&P (Addendum)
[place]   PATIENT NAME: Veronica Calhoun    MR#:  [id number]  DATE OF BIRTH:  03-13-1986  DATE OF ADMISSION:  06/23/2022  PRIMARY CARE PHYSICIAN: Pcp, No   Patient is coming from: Home  REQUESTING/REFERRING PHYSICIAN: Redwine, Madison A, PA-C    CHIEF COMPLAINT:   Chief Complaint  Patient presents with   Abdominal Pain    HISTORY OF PRESENT ILLNESS:  Veronica Calhoun is a 37 y.o. African-American female with medical history significant for anxiety, hypertension, alcohol abuse and generalized anxiety disorder who was just discharged from [facility] this morning after being admitted 2 weeks ago for alcohol induced pancreatitis.  She developed hospital-acquired pneumonia for which she was treated as well as E. coli UTI.  She denies drinking alcohol since she was discharged.  She gained about 46 pounds during her hospitalization.  She has been complaining of abdominal distention with swelling and lower extremity edema.  She is barely able to ambulate secondarily.  While she was hospitalized she was getting IV morphine sulfate for pain and when discharged she has been using ibuprofen without much help.  No chest pain or palpitations.  She admits to orthopnea as well as paroxysmal nocturnal dyspnea, dyspnea on exertion as well as cough productive of clear thick sputum with associated wheezing with her dyspnea.  No nausea or vomiting or diarrhea, or melena or bright red being per rectum.  No dysuria, oliguria or hematuria or flank pain.  ED Course: When she came to the ER, temperature was 99.1 with a blood pressure of 129/97 respiratory rate 21.  CMP revealed hyponatremia of 134, CO2 of 20, alk phos 219 slightly up from this morning and from yesterday, albumin of 4.9 comparable to this morning, AST 158 comparable to this morning and yesterday and ALT of 79 compared to 84 this morning with total protein of 6.3 and total bili of 6.3 compared to 7.2 this morning.  CBC showed  leukocytosis of 24.2 slightly up from 22.7 this morning and anemia slightly worse than this morning with hemoglobin 10.6 and hematocrit 31.8.  INR is 1.2 and PT 15.3.  Blood cultures were drawn.    Imaging: Portable chest x-ray showed cardiomegaly with vascular congestion and pulmonary edema, suspected left pleural effusion and airspace disease in the left base that may be due to atelectasis or pneumonia.  The patient was given 1 mg of IV Dilaudid, 40 mg of IV Lasix, IV cefepime and vancomycin.  She will be admitted to a progressive unit bed for further evaluation and management. PAST MEDICAL HISTORY:   Past Medical History:  Diagnosis Date   Anxiety 2013   Generalized anxiety disorder 04/01/2013   Hypertension 2014    PAST SURGICAL HISTORY:   Past Surgical History:  Procedure Laterality Date   CESAREAN SECTION  03/18/2007    SOCIAL HISTORY:   Social History   Tobacco Use   Smoking status: Every Day    Packs/day: 1.00    Years: 10.00    Total pack years: 10.00    Types: Cigarettes   Smokeless tobacco: Never  Substance Use Topics   Alcohol use: Yes    Alcohol/week: 10.0 standard drinks of alcohol    Types: 10 Cans of beer per week    Comment: 4-5 beers daily    FAMILY HISTORY:   Family History  Problem Relation Age of Onset   Heart murmur Father    AAA (abdominal aortic aneurysm) Sister  Urinary tract infection Daughter    Hypertension Maternal Grandmother     DRUG ALLERGIES:   Allergies  Allergen Reactions   Lisinopril Swelling    Reports Lip swelling   Amlodipine Other (See Comments)    "like I was going to pass out"    REVIEW OF SYSTEMS:   ROS As per history of present illness. All pertinent systems were reviewed above. Constitutional, HEENT, cardiovascular, respiratory, GI, GU, musculoskeletal, neuro, psychiatric, endocrine, integumentary and hematologic systems were reviewed and are otherwise negative/unremarkable except for positive findings  mentioned above in the HPI.   MEDICATIONS AT HOME:   Prior to Admission medications   Medication Sig Start Date End Date Taking? Authorizing Provider  albuterol (VENTOLIN HFA) 108 (90 Base) MCG/ACT inhaler Inhale 2 puffs into the lungs every 6 (six) hours as needed for wheezing or shortness of breath. 06/23/22  Yes Shah, Pratik D, DO  cloNIDine (CATAPRES) 0.2 MG tablet Take 1 tablet (0.2 mg total) by mouth 2 (two) times daily. 06/23/22 08/22/22 Yes Shah, Pratik D, DO  folic acid (FOLVITE) 1 MG tablet Take 1 tablet (1 mg total) by mouth daily. 06/23/22 07/23/22 Yes Shah, Pratik D, DO  guaiFENesin-dextromethorphan (ROBITUSSIN DM) 100-10 MG/5ML syrup Take 15 mLs by mouth every 4 (four) hours as needed for cough. 06/23/22  Yes Shah, Pratik D, DO  ibuprofen (ADVIL) 200 MG tablet Take 200 mg by mouth every 6 (six) hours as needed for moderate pain.   Yes [provider]  metoprolol tartrate (LOPRESSOR) 50 MG tablet Take 1 tablet (50 mg total) by mouth 2 (two) times daily. 06/23/22 08/22/22 Yes Shah, Pratik D, DO  pantoprazole (PROTONIX) 40 MG tablet Take 1 tablet (40 mg total) by mouth daily. 06/23/22 06/23/23 Yes Shah, Pratik D, DO  polyethylene glycol (MIRALAX) 17 g packet Take 17 g by mouth daily as needed for mild constipation, moderate constipation or severe constipation. 06/23/22  Yes Shah, Pratik D, DO  thiamine (VITAMIN B-1) 100 MG tablet Take 1 tablet (100 mg total) by mouth daily. 06/23/22 07/23/22  Manuella Ghazi, Pratik D, DO      VITAL SIGNS:  Blood pressure (!) 149/95, pulse 78, temperature 99.1 F (37.3 C), resp. rate (!) 21, last menstrual period 06/09/2022, SpO2 100 %.  PHYSICAL EXAMINATION:  Physical Exam  GENERAL:  37 y.o.-year-old African American female patient lying in the bed in mild respiratory distress with conversational dyspnea EYES: Pupils equal, round, reactive to light and accommodation.  Positive scleral icterus. Extraocular muscles intact.  HEENT: Head atraumatic,  normocephalic. Oropharynx and nasopharynx clear.  NECK:  Supple, no jugular venous distention. No thyroid enlargement, no tenderness.  LUNGS: Diminished bibasilar breath sounds with left basal crackles. No use of accessory muscles of respiration.  CARDIOVASCULAR: Regular rate and rhythm, S1, S2 normal. No murmurs, rubs, or gallops.  ABDOMEN: Soft, nondistended, nontender. Bowel sounds present. No organomegaly or mass.  EXTREMITIES: 2+ bilateral lower extremity pitting edema with no clubbing or cyanosis. NEUROLOGIC: Cranial nerves II through XII are intact. Muscle strength 5/5 in all extremities. Sensation intact. Gait not checked.  PSYCHIATRIC: The patient is alert and oriented x 3.  Normal affect and good eye contact. SKIN: No obvious rash, lesion, or ulcer.   LABORATORY PANEL:   CBC Recent Labs  Lab 06/23/22 2220  WBC 24.2*  HGB 10.6*  HCT 31.8*  PLT 385   ------------------------------------------------------------------------------------------------------------------  Chemistries  Recent Labs  Lab 06/21/22 0552 06/22/22 0539 06/23/22 2220  NA 136   < > 134*  K 4.0   < > 3.8  CL 109   < > 105  CO2 18*   < > 20*  GLUCOSE 106*   < > 96  BUN 10   < > 5*  CREATININE 0.40*   < > 0.44  CALCIUM 7.9*   < > 8.3*  MG 1.9  --   --   AST 139*   < > 158*  ALT 64*   < > 79*  ALKPHOS 135*   < > 219*  BILITOT 8.1*   < > 6.3*   < > = values in this interval not displayed.   ------------------------------------------------------------------------------------------------------------------  Cardiac Enzymes No results for input(s): "TROPONINI" in the last 168 hours. ------------------------------------------------------------------------------------------------------------------  RADIOLOGY:  DG Chest Portable 1 View  Result Date: 06/23/2022 CLINICAL DATA:  Shortness of breath EXAM: PORTABLE CHEST 1 VIEW COMPARISON:  Chest CT 06/15/2022 FINDINGS: Mild cardiomegaly with vascular  congestion and pulmonary edema. Suspected left pleural effusion. Airspace disease at the left base. IMPRESSION: Cardiomegaly with vascular congestion and pulmonary edema. Suspected left pleural effusion. Airspace disease at the left base may be due to atelectasis or pneumonia. Electronically Signed   By: Donavan Foil M.D.   On: 06/23/2022 22:49      IMPRESSION AND PLAN:  Assessment and Plan: * Sepsis due to pneumonia [facility]) - The patient was admitted to a progressive unit bed. - Sepsis manifested by leukocytosis and tachypnea and is likely associated with left-sided parapneumonic effusion. - She has developed this pneumonia during her last admission. - We will continue IV antibiotic therapy with IV cefepime, vancomycin and Zithromax. - We will follow blood cultures. - Will be placed on mucolytic therapy as well as bronchodilator therapy.  Anasarca - This is multifactorial due to underlying hepatic insufficiency and hypoalbuminemia as well as possibly developing acute CHF likely diastolic that could also be cor pulmonale. - She will be diuresed with IV Lasix. - 2D echo be obtained given her acute pulmonary edema. - Will defer cardiology consult to the morning hospitalist depending on echo results. - The patient will have a gastroenterology consult. - Dr. Therisa Doyne was notified about the patient and is aware.  Elevated LFTs - This could be multifactorial due to congestive hepatopathy associated with her anasarca as well as alcoholic hepatitis. - GI consultation will be obtained as mentioned above. - We will follow LFTs with diuresis.  Alcohol abuse - She has not been drinking since she went home. - We will continue thiamine and folate as well as multivitamins.. - Her serum lipase is currently normal indicating resolved recent alcoholic pancreatitis.  GERD without esophagitis - We will continue PPI therapy.  Essential hypertension - We will continue her antihypertensives.   DVT  prophylaxis: Lovenox.  Advanced Care Planning:  Code Status: full code.  Family Communication:  The plan of care was discussed in details with the patient (and family). I answered all questions. The patient agreed to proceed with the above mentioned plan. Further management will depend upon hospital course. Disposition Plan: Back to previous home environment Consults called: Gastroenterology. All the records are reviewed and case discussed with ED provider.  Status is: Inpatient  At the time of the admission, it appears that the appropriate admission status for this patient is inpatient.  This is judged to be reasonable and necessary in order to provide the required intensity of service to ensure the patient's safety given the presenting symptoms, physical exam findings and initial radiographic and laboratory data in the  context of comorbid conditions.  The patient requires inpatient status due to high intensity of service, high risk of further deterioration and high frequency of surveillance required.  I certify that at the time of admission, it is my clinical judgment that the patient will require inpatient hospital care extending more than 2 midnights.                            Dispo: The patient is from: Home              Anticipated d/c is to: Home              Patient currently is not medically stable to d/c.              Difficult to place patient: No  Christel Mormon M.D on 06/24/2022 at 1:34 AM  [facility]   From 7 PM-7 AM, contact night-coverage [email or web address]  CC: Primary care physician; Pcp, No

## 2022-06-23 NOTE — ED Triage Notes (Signed)
Pt returns for re-evaluation following discharge s/t alcohol induced pancreatitis on earlier today. States concern for ongoing abdominal pain, abdominal swelling, and bilateral leg swelling. Worsening swelling causing difficulty with ambulation.

## 2022-06-23 NOTE — ED Provider Notes (Signed)
[facility] Provider Note   CSN: [id number] Arrival date & time: 06/23/22  2031     History  Chief Complaint  Patient presents with   Abdominal Pain    Veronica Calhoun is a 37 y.o. female with a past medical history of alcohol use disorder discharged from [facility] this morning presenting today with concern of abdominal pain and lower extremity swelling.  She was admitted to the hospital for the past 2 weeks after being diagnosed with alcohol induced pancreatitis.  She denies any alcohol use since she was discharged.  She is concerned because she believes that she gained weight while in the hospital and continues to have worsening swelling in the abdomen and legs.  She says that she is barely able to ambulate.  Morphine was helping her pain in the hospital however she was discharged home without any medications to help her pain and ibuprofen is not helping her.   Abdominal Pain Associated symptoms: chills   Associated symptoms: no fever, no nausea and no vomiting        Home Medications Prior to Admission medications   Medication Sig Start Date End Date Taking? Authorizing Provider  albuterol (VENTOLIN HFA) 108 (90 Base) MCG/ACT inhaler Inhale 2 puffs into the lungs every 6 (six) hours as needed for wheezing or shortness of breath. 06/23/22   Manuella Ghazi, Pratik D, DO  cloNIDine (CATAPRES) 0.2 MG tablet Take 1 tablet (0.2 mg total) by mouth 2 (two) times daily. 06/23/22 08/22/22  Manuella Ghazi, Pratik D, DO  folic acid (FOLVITE) 1 MG tablet Take 1 tablet (1 mg total) by mouth daily. 06/23/22 07/23/22  Manuella Ghazi, Pratik D, DO  guaiFENesin-dextromethorphan (ROBITUSSIN DM) 100-10 MG/5ML syrup Take 15 mLs by mouth every 4 (four) hours as needed for cough. 06/23/22   Manuella Ghazi, Pratik D, DO  metoprolol tartrate (LOPRESSOR) 50 MG tablet Take 1 tablet (50 mg total) by mouth 2 (two) times daily. 06/23/22 08/22/22  Manuella Ghazi, Pratik D, DO  pantoprazole (PROTONIX) 40 MG  tablet Take 1 tablet (40 mg total) by mouth daily. 06/23/22 06/23/23  Manuella Ghazi, Pratik D, DO  polyethylene glycol (MIRALAX) 17 g packet Take 17 g by mouth daily as needed for mild constipation, moderate constipation or severe constipation. 06/23/22   Manuella Ghazi, Pratik D, DO  thiamine (VITAMIN B-1) 100 MG tablet Take 1 tablet (100 mg total) by mouth daily. 06/23/22 07/23/22  Manuella Ghazi, Pratik D, DO      Allergies    Lisinopril and Amlodipine    Review of Systems   Review of Systems  Constitutional:  Positive for chills. Negative for fever.  Gastrointestinal:  Positive for abdominal pain. Negative for nausea and vomiting.  Musculoskeletal:  Positive for joint swelling.  Skin:  Positive for pallor.    Physical Exam Updated Vital Signs BP (!) 129/97   Pulse 87   Temp 99.1 F (37.3 C) (Oral)   Resp (!) 21   LMP 06/09/2022 (Exact Date)   SpO2 100%  Physical Exam Vitals and nursing note reviewed.  Constitutional:      General: She is not in acute distress.    Appearance: Normal appearance. She is not ill-appearing.  HENT:     Head: Normocephalic and atraumatic.  Eyes:     General: Scleral icterus present.     Conjunctiva/sclera: Conjunctivae normal.  Cardiovascular:     Rate and Rhythm: Normal rate and regular rhythm.  Pulmonary:     Effort: Pulmonary effort is normal. No  respiratory distress.     Breath sounds: No wheezing (Left lower lung field).  Abdominal:     General: There is distension.     Palpations: Abdomen is rigid.     Tenderness: There is generalized abdominal tenderness.     Hernia: No hernia is present.  Skin:    General: Skin is warm and dry.     Coloration: Skin is jaundiced.     Findings: No rash.  Neurological:     Mental Status: She is alert.  Psychiatric:        Mood and Affect: Mood normal.     ED Results / Procedures / Treatments   Labs (all labs ordered are listed, but only abnormal results are displayed) Labs Reviewed  COMPREHENSIVE METABOLIC PANEL -  Abnormal; Notable for the following components:      Result Value   Sodium 134 (*)    CO2 20 (*)    BUN 5 (*)    Calcium 8.3 (*)    Total Protein 6.3 (*)    Albumin 1.9 (*)    AST 158 (*)    ALT 79 (*)    Alkaline Phosphatase 219 (*)    Total Bilirubin 6.3 (*)    All other components within normal limits  CBC WITH DIFFERENTIAL/PLATELET - Abnormal; Notable for the following components:   WBC 24.2 (*)    RBC 3.12 (*)    Hemoglobin 10.6 (*)    HCT 31.8 (*)    MCV 101.9 (*)    RDW 16.7 (*)    Neutro Abs 18.6 (*)    Monocytes Absolute 1.4 (*)    Abs Immature Granulocytes 1.25 (*)    All other components within normal limits  CULTURE, BLOOD (ROUTINE X 2)  CULTURE, BLOOD (ROUTINE X 2)  LIPASE, BLOOD  LACTIC ACID, PLASMA  LACTIC ACID, PLASMA  PROTIME-INR  BRAIN NATRIURETIC PEPTIDE    EKG None  Radiology No results found.  Procedures .Critical Care  Performed by: Rhae Hammock, PA-C Authorized by: Rhae Hammock, PA-C   Critical care provider statement:    Critical care time (minutes):  30   Critical care time was exclusive of:  Separately billable procedures and treating other patients   Critical care was necessary to treat or prevent imminent or life-threatening deterioration of the following conditions:  Hepatic failure and sepsis   Critical care was time spent personally by me on the following activities:  Development of treatment plan with patient or surrogate, discussions with primary provider, evaluation of patient's response to treatment, examination of patient, obtaining history from patient or surrogate, ordering and performing treatments and interventions, ordering and review of laboratory studies, pulse oximetry, ordering and review of radiographic studies, re-evaluation of patient's condition, review of old charts and discussions with consultants   I assumed direction of critical care for this patient from another provider in my specialty: no     Care  discussed with: admitting provider      Medications Ordered in ED Medications  HYDROmorphone (DILAUDID) injection 1 mg (has no administration in time range)    ED Course/ Medical Decision Making/ A&P                             Medical Decision Making Amount and/or Complexity of Data Reviewed Labs: ordered. Radiology: ordered.  Risk Prescription drug management. Decision regarding hospitalization.     Past Medical History / Co-morbidities / Social History: Alcohol  use disorder, recent hospitalization   Additional history: I reviewed patient's hospitalization at [facility].  She was originally admitted for alcohol induced pancreatitis.  She was admitted from 06/10/22 up until this morning when she was discharged home.  Per chart review patient was admitted to [facility] at 260 pounds.  Her next recorded weight was 288 3 days later.  On the 17th she was 304 pounds and today she is 330 pounds. 46 pound weight gain.  Patient had CT imaging of the abdomen on the 11th which showed worsening ileus that was first seen on her CAT scan on the seventh.  Again had an x-ray of the abdomen on the 13th which showed ileus   Physical Exam: Pertinent physical exam findings include Scleral icterus, distended abdomen, bilateral lower extremity edema Anasarca  Lab Tests: I ordered, and personally interpreted labs.  The pertinent results include: White count 24.2, slightly increased from 22.7 this morning.  Patient's WBC count was 8.9 when she was admitted and has trended upwards Slight macrocytic anemia consistent with alcohol use Normal lipase Elevated LFTs, pretty stable from discharge.   Imaging Studies: I ordered and independently visualized and interpreted CXR and I agree with the radiologist that there are some signs of PNA in the LLL, consistent with PE.   Consider CT abdomen pelvis however this was done twice during her hospitalization.  Will defer to hospitalist.      Medications: Dilaudid given for patient's pain Cefepime and vancomycin ordered as broad-spectrum antibiotics covering patient for hospital-acquired pneumonia   Consultations Obtained: Dr. Therisa Doyne with GI was sent a secure chat about seeing this patient. She agrees to consult.   MDM/Disposition: This is a 37 year old female presenting today with concern for being discharged from the hospital too soon.  She was admitted to [facility] for 2 weeks for alcohol related pancreatitis.  During her hospitalization she was also diagnosed with pneumonia, hypertensive crisis and ileus.  She returned home today and was unable to maneuver due to the extent of the swelling in her legs.  She also endorsed severe abdominal pain that was refractory to ibuprofen.  She was being treated with morphine in the hospital which sufficiently controlled her pain.  On arrival patient's abdomen is very distended and she has bilateral lower extremity edema.  Also is mildly tachypneic.  She gained 46 pounds while at [facility].  Likely fluid weight.  Her white blood cell count steadily climbed during her hospitalization after it being normal at admission.  It is slightly elevated when compared to her discharge level.  At this time I believe patient needs admission for septic pneumonia, fluid overload and likely a PT consult.    Final Clinical Impression(s) / ED Diagnoses Final diagnoses:  Acute pulmonary edema ([place])  Anasarca  Transaminitis  Hypoalbuminemia  Sepsis due to pneumonia ([place])    Rx / DC Orders ED Discharge Orders     None      Admit to Dr. Sidney Ace   I discussed this case with my attending physician Dr. Mayra Neer who cosigned this note including patient's presenting symptoms, physical exam, and planned diagnostics and interventions. Attending physician stated agreement with plan or made changes to plan which were implemented.      Rhae Hammock, PA-C 06/23/22 2320    Audley Hose, MD 06/24/22  [phone]

## 2022-06-23 NOTE — H&P (Incomplete)
[facility]   PATIENT NAME: Veronica Calhoun    MR#:  [id number]  DATE OF BIRTH:  07/26/1985  DATE OF ADMISSION:  06/23/2022  PRIMARY CARE PHYSICIAN: Pcp, No   Patient is coming from: Home  REQUESTING/REFERRING PHYSICIAN: Redwine, Madison A, PA-C    CHIEF COMPLAINT:   Chief Complaint  Patient presents with  . Abdominal Pain    HISTORY OF PRESENT ILLNESS:  Veronica Calhoun is a 37 y.o. African-American female with medical history significant for anxiety, hypertension, alcohol abuse and generalized anxiety disorder who was just discharged from [facility] this morning after being admitted 2 weeks ago for alcohol induced pancreatitis.  She developed hospital-acquired pneumonia for which she was treated as well as E. coli UTI.  She denies drinking alcohol since she was discharged.  She gained about 46 pounds during her hospitalization.  She has been complaining of abdominal distention with swelling and lower extremity edema.  She is barely able to ambulate secondarily.  While she was hospitalized she was getting IV morphine sulfate for pain and when discharged she has been using ibuprofen without much help.  No chest pain or palpitations.  No nausea or vomiting or diarrhea.  Or melena or bright red being per rectum.  No dysuria, oliguria or hematuria or flank pain.  ED Course: When she came to the ER, temperature was 99.1 with a blood pressure of 129/97 respiratory rate 21.  CMP revealed hyponatremia of 134, CO2 of 20, alk phos 219 slightly up from this morning and from yesterday, albumin of 4.9 comparable to this morning, AST 158 comparable to this morning and yesterday and ALT of 79 compared to 84 this morning with total protein of 6.3 and total bili of 6.3 compared to 7.2 this morning.  CBC showed leukocytosis of 24.2 slightly up from 22.7 this morning and anemia slightly worse than this morning with hemoglobin 10.6 and hematocrit 31.8.  INR is 1.2 and PT 15.3.  Blood cultures  were drawn.    Imaging: Portable chest x-ray showed cardiomegaly with vascular congestion and pulmonary edema, suspected left pleural effusion and airspace disease in the left base that may be due to atelectasis or pneumonia.  The patient was given 1 mg of IV Dilaudid, 40 mg of IV Lasix, IV cefepime and vancomycin. PAST MEDICAL HISTORY:   Past Medical History:  Diagnosis Date  . Anxiety 2013  . Generalized anxiety disorder 04/01/2013  . Hypertension 2014    PAST SURGICAL HISTORY:   Past Surgical History:  Procedure Laterality Date  . CESAREAN SECTION  03/18/2007    SOCIAL HISTORY:   Social History   Tobacco Use  . Smoking status: Every Day    Packs/day: 1.00    Years: 10.00    Total pack years: 10.00    Types: Cigarettes  . Smokeless tobacco: Never  Substance Use Topics  . Alcohol use: Yes    Alcohol/week: 10.0 standard drinks of alcohol    Types: 10 Cans of beer per week    Comment: 4-5 beers daily    FAMILY HISTORY:   Family History  Problem Relation Age of Onset  . Heart murmur Father   . AAA (abdominal aortic aneurysm) Sister   . Urinary tract infection Daughter   . Hypertension Maternal Grandmother     DRUG ALLERGIES:   Allergies  Allergen Reactions  . Lisinopril Swelling    Reports Lip swelling  . Amlodipine Other (See Comments)    "like I  was going to pass out"    REVIEW OF SYSTEMS:   ROS As per history of present illness. All pertinent systems were reviewed above. Constitutional, HEENT, cardiovascular, respiratory, GI, GU, musculoskeletal, neuro, psychiatric, endocrine, integumentary and hematologic systems were reviewed and are otherwise negative/unremarkable except for positive findings mentioned above in the HPI.   MEDICATIONS AT HOME:   Prior to Admission medications   Medication Sig Start Date End Date Taking? Authorizing Provider  albuterol (VENTOLIN HFA) 108 (90 Base) MCG/ACT inhaler Inhale 2 puffs into the lungs every 6 (six) hours  as needed for wheezing or shortness of breath. 06/23/22  Yes Shah, Pratik D, DO  cloNIDine (CATAPRES) 0.2 MG tablet Take 1 tablet (0.2 mg total) by mouth 2 (two) times daily. 06/23/22 08/22/22 Yes Shah, Pratik D, DO  folic acid (FOLVITE) 1 MG tablet Take 1 tablet (1 mg total) by mouth daily. 06/23/22 07/23/22 Yes Shah, Pratik D, DO  guaiFENesin-dextromethorphan (ROBITUSSIN DM) 100-10 MG/5ML syrup Take 15 mLs by mouth every 4 (four) hours as needed for cough. 06/23/22  Yes Shah, Pratik D, DO  ibuprofen (ADVIL) 200 MG tablet Take 200 mg by mouth every 6 (six) hours as needed for moderate pain.   Yes [provider]  metoprolol tartrate (LOPRESSOR) 50 MG tablet Take 1 tablet (50 mg total) by mouth 2 (two) times daily. 06/23/22 08/22/22 Yes Shah, Pratik D, DO  pantoprazole (PROTONIX) 40 MG tablet Take 1 tablet (40 mg total) by mouth daily. 06/23/22 06/23/23 Yes Shah, Pratik D, DO  polyethylene glycol (MIRALAX) 17 g packet Take 17 g by mouth daily as needed for mild constipation, moderate constipation or severe constipation. 06/23/22  Yes Shah, Pratik D, DO  thiamine (VITAMIN B-1) 100 MG tablet Take 1 tablet (100 mg total) by mouth daily. 06/23/22 07/23/22  Manuella Ghazi, Pratik D, DO      VITAL SIGNS:  Blood pressure (!) 129/97, pulse 87, temperature 99.1 F (37.3 C), temperature source Oral, resp. rate (!) 21, last menstrual period 06/09/2022, SpO2 100 %.  PHYSICAL EXAMINATION:  Physical Exam  GENERAL:  37 y.o.-year-old African American female patient lying in the bed in mild respiratory distress with conversational dyspnea EYES: Pupils equal, round, reactive to light and accommodation. No scleral icterus. Extraocular muscles intact.  HEENT: Head atraumatic, normocephalic. Oropharynx and nasopharynx clear.  NECK:  Supple, no jugular venous distention. No thyroid enlargement, no tenderness.  LUNGS: Diminished bibasilar breath sounds with left basal crackles. No use of accessory muscles of respiration.   CARDIOVASCULAR: Regular rate and rhythm, S1, S2 normal. No murmurs, rubs, or gallops.  ABDOMEN: Soft, nondistended, nontender. Bowel sounds present. No organomegaly or mass.  EXTREMITIES: 2+ bilateral lower extremity pitting edema with no clubbing or cyanosis. NEUROLOGIC: Cranial nerves II through XII are intact. Muscle strength 5/5 in all extremities. Sensation intact. Gait not checked.  PSYCHIATRIC: The patient is alert and oriented x 3.  Normal affect and good eye contact. SKIN: No obvious rash, lesion, or ulcer.   LABORATORY PANEL:   CBC Recent Labs  Lab 06/23/22 2220  WBC 24.2*  HGB 10.6*  HCT 31.8*  PLT 385   ------------------------------------------------------------------------------------------------------------------  Chemistries  Recent Labs  Lab 06/21/22 0552 06/22/22 0539 06/23/22 2220  NA 136   < > 134*  K 4.0   < > 3.8  CL 109   < > 105  CO2 18*   < > 20*  GLUCOSE 106*   < > 96  BUN 10   < > 5*  CREATININE 0.40*   < > 0.44  CALCIUM 7.9*   < > 8.3*  MG 1.9  --   --   AST 139*   < > 158*  ALT 64*   < > 79*  ALKPHOS 135*   < > 219*  BILITOT 8.1*   < > 6.3*   < > = values in this interval not displayed.   ------------------------------------------------------------------------------------------------------------------  Cardiac Enzymes No results for input(s): "TROPONINI" in the last 168 hours. ------------------------------------------------------------------------------------------------------------------  RADIOLOGY:  DG Chest Portable 1 View  Result Date: 06/23/2022 CLINICAL DATA:  Shortness of breath EXAM: PORTABLE CHEST 1 VIEW COMPARISON:  Chest CT 06/15/2022 FINDINGS: Mild cardiomegaly with vascular congestion and pulmonary edema. Suspected left pleural effusion. Airspace disease at the left base. IMPRESSION: Cardiomegaly with vascular congestion and pulmonary edema. Suspected left pleural effusion. Airspace disease at the left base may be due to  atelectasis or pneumonia. Electronically Signed   By: Donavan Foil M.D.   On: 06/23/2022 22:49      IMPRESSION AND PLAN:  Assessment and Plan: No notes have been filed under this hospital service. Service: Hospitalist      DVT prophylaxis: Lovenox***  Advanced Care Planning:  Code Status: full code***  Family Communication:  The plan of care was discussed in details with the patient (and family). I answered all questions. The patient agreed to proceed with the above mentioned plan. Further management will depend upon hospital course. Disposition Plan: Back to previous home environment Consults called: none***  All the records are reviewed and case discussed with ED provider.  Status is: Inpatient {Inpatient:23812}   At the time of the admission, it appears that the appropriate admission status for this patient is inpatient.  This is judged to be reasonable and necessary in order to provide the required intensity of service to ensure the patient's safety given the presenting symptoms, physical exam findings and initial radiographic and laboratory data in the context of comorbid conditions.  The patient requires inpatient status due to high intensity of service, high risk of further deterioration and high frequency of surveillance required.  I certify that at the time of admission, it is my clinical judgment that the patient will require inpatient hospital care extending more than 2 midnights.                            Dispo: The patient is from: Home              Anticipated d/c is to: Home              Patient currently is not medically stable to d/c.              Difficult to place patient: No  Christel Mormon M.D on 06/23/2022 at 11:44 PM  [facility]   From 7 PM-7 AM, contact night-coverage [email or web address]  CC: Primary care physician; Pcp, No

## 2022-06-23 NOTE — Telephone Encounter (Signed)
Patient being discharged today. Needs follow-up in 2 weeks. Dx: ETOH pancreatitis and elevated LFTs.   [place]:  Please arrange HFP and INR in 1 week. Dx: Elevated LFTs.

## 2022-06-24 ENCOUNTER — Inpatient Hospital Stay (HOSPITAL_COMMUNITY): Payer: Medicaid Other

## 2022-06-24 ENCOUNTER — Encounter (HOSPITAL_COMMUNITY): Payer: Self-pay | Admitting: Family Medicine

## 2022-06-24 DIAGNOSIS — E8809 Other disorders of plasma-protein metabolism, not elsewhere classified: Secondary | ICD-10-CM

## 2022-06-24 DIAGNOSIS — R7989 Other specified abnormal findings of blood chemistry: Secondary | ICD-10-CM

## 2022-06-24 DIAGNOSIS — F1010 Alcohol abuse, uncomplicated: Secondary | ICD-10-CM

## 2022-06-24 DIAGNOSIS — J189 Pneumonia, unspecified organism: Secondary | ICD-10-CM | POA: Diagnosis not present

## 2022-06-24 DIAGNOSIS — K219 Gastro-esophageal reflux disease without esophagitis: Secondary | ICD-10-CM

## 2022-06-24 DIAGNOSIS — A419 Sepsis, unspecified organism: Secondary | ICD-10-CM | POA: Diagnosis not present

## 2022-06-24 HISTORY — DX: Alcohol abuse, uncomplicated: F10.10

## 2022-06-24 LAB — BRAIN NATRIURETIC PEPTIDE
B Natriuretic Peptide: 477.3 pg/mL — ABNORMAL HIGH (ref 0.0–100.0)
B Natriuretic Peptide: 413.2 pg/mL — ABNORMAL HIGH (ref 0.0–100.0)

## 2022-06-24 LAB — MRSA NEXT GEN BY PCR, NASAL: MRSA by PCR Next Gen: NOT DETECTED

## 2022-06-24 LAB — CBC
HCT: 34.2 % — ABNORMAL LOW (ref 36.0–46.0)
Hemoglobin: 11.2 g/dL — ABNORMAL LOW (ref 12.0–15.0)
MCH: 33.8 pg (ref 26.0–34.0)
MCHC: 32.7 g/dL (ref 30.0–36.0)
MCV: 103.3 fL — ABNORMAL HIGH (ref 80.0–100.0)
Platelets: 344 10*3/uL (ref 150–400)
RBC: 3.31 MIL/uL — ABNORMAL LOW (ref 3.87–5.11)
RDW: 16.7 % — ABNORMAL HIGH (ref 11.5–15.5)
WBC: 21.6 10*3/uL — ABNORMAL HIGH (ref 4.0–10.5)
nRBC: 0 % (ref 0.0–0.2)

## 2022-06-24 LAB — HEPATITIS PANEL, ACUTE
HCV Ab: NONREACTIVE
Hep A IgM: NONREACTIVE
Hep B C IgM: NONREACTIVE
Hepatitis B Surface Ag: NONREACTIVE

## 2022-06-24 LAB — CORTISOL-AM, BLOOD: Cortisol - AM: 10.7 ug/dL (ref 6.7–22.6)

## 2022-06-24 LAB — BASIC METABOLIC PANEL
Anion gap: 11 (ref 5–15)
BUN: <5 mg/dL — ABNORMAL LOW (ref 6–20)
CO2: 21 mmol/L — ABNORMAL LOW (ref 22–32)
Calcium: 8.4 mg/dL — ABNORMAL LOW (ref 8.9–10.3)
Chloride: 103 mmol/L (ref 98–111)
Creatinine, Ser: 0.43 mg/dL — ABNORMAL LOW (ref 0.44–1.00)
GFR, Estimated: >60 mL/min (ref >60)
Glucose, Bld: 96 mg/dL (ref 70–99)
Potassium: 3.6 mmol/L (ref 3.5–5.1)
Sodium: 135 mmol/L (ref 135–145)

## 2022-06-24 LAB — MITOCHONDRIAL ANTIBODIES: Mitochondrial M2 Ab, IgG: <20 Units (ref 0.0–20.0)

## 2022-06-24 LAB — RESP PANEL BY RT-PCR (RSV, FLU A&B, COVID)  RVPGX2
Influenza A by PCR: NEGATIVE
Influenza B by PCR: NEGATIVE
Resp Syncytial Virus by PCR: NEGATIVE
SARS Coronavirus 2 by RT PCR: NEGATIVE

## 2022-06-24 LAB — PROCALCITONIN: Procalcitonin: 0.8 ng/mL

## 2022-06-24 LAB — LACTIC ACID, PLASMA
Lactic Acid, Venous: 0.9 mmol/L (ref 0.5–1.9)
Lactic Acid, Venous: 1 mmol/L (ref 0.5–1.9)

## 2022-06-24 LAB — PROTIME-INR
INR: 1.2 (ref 0.8–1.2)
Prothrombin Time: 15.4 seconds — ABNORMAL HIGH (ref 11.4–15.2)

## 2022-06-24 LAB — ANTI-SMOOTH MUSCLE ANTIBODY, IGG: F-Actin IgG: 15 Units (ref 0–19)

## 2022-06-24 LAB — VITAMIN B12: Vitamin B-12: 937 pg/mL — ABNORMAL HIGH (ref 180–914)

## 2022-06-24 LAB — ANA: Anti Nuclear Antibody (ANA): NEGATIVE

## 2022-06-24 LAB — FOLATE: Folate: 10.4 ng/mL (ref >5.9)

## 2022-06-24 MED ORDER — TRAZODONE HCL 50 MG PO TABS
50.0000 mg | ORAL_TABLET | Freq: Every evening | ORAL | Status: DC | PRN
  Administered 2022-06-24 – 2022-06-25 (×2): 50 mg via ORAL
  Filled 2022-06-24 (×2): qty 1

## 2022-06-24 MED ORDER — VANCOMYCIN HCL 1500 MG/300ML IV SOLN
1500.0000 mg | Freq: Two times a day (BID) | INTRAVENOUS | Status: DC
  Administered 2022-06-24 – 2022-06-25 (×2): 1500 mg via INTRAVENOUS
  Filled 2022-06-24 (×3): qty 300

## 2022-06-24 MED ORDER — IPRATROPIUM-ALBUTEROL 0.5-2.5 (3) MG/3ML IN SOLN: 3.0000 mL | RESPIRATORY_TRACT | Status: DC | PRN

## 2022-06-24 MED ORDER — HYDROMORPHONE HCL 1 MG/ML IJ SOLN
1.0000 mg | INTRAMUSCULAR | Status: DC | PRN
  Administered 2022-06-24 (×6): 1 mg via INTRAVENOUS
  Filled 2022-06-24 (×6): qty 1

## 2022-06-24 MED ORDER — METOPROLOL TARTRATE 5 MG/5ML IV SOLN: 5.0000 mg | INTRAVENOUS | Status: DC | PRN

## 2022-06-24 MED ORDER — HYDROMORPHONE HCL 1 MG/ML IJ SOLN
1.0000 mg | INTRAMUSCULAR | Status: DC | PRN
  Administered 2022-06-25 – 2022-06-26 (×11): 1 mg via INTRAVENOUS
  Filled 2022-06-24 (×11): qty 1

## 2022-06-24 MED ORDER — OXYCODONE HCL 5 MG/5ML PO SOLN
5.0000 mg | Freq: Once | ORAL | Status: AC
  Administered 2022-06-24: 5 mg via ORAL
  Filled 2022-06-24: qty 5

## 2022-06-24 MED ORDER — NICOTINE 21 MG/24HR TD PT24
21.0000 mg | MEDICATED_PATCH | Freq: Every day | TRANSDERMAL | Status: DC
  Administered 2022-06-24 – 2022-06-25 (×2): 21 mg via TRANSDERMAL
  Filled 2022-06-24 (×3): qty 1

## 2022-06-24 MED ORDER — IOHEXOL 300 MG/ML  SOLN
100.0000 mL | Freq: Once | INTRAMUSCULAR | Status: AC | PRN
  Administered 2022-06-24: 100 mL via INTRAVENOUS

## 2022-06-24 MED ORDER — IBUPROFEN 200 MG PO TABS
600.0000 mg | ORAL_TABLET | Freq: Once | ORAL | Status: AC
  Administered 2022-06-24: 600 mg via ORAL
  Filled 2022-06-24: qty 3

## 2022-06-24 MED ORDER — HYDRALAZINE HCL 20 MG/ML IJ SOLN: 10.0000 mg | INTRAMUSCULAR | Status: DC | PRN

## 2022-06-24 NOTE — Assessment & Plan Note (Signed)
-   We will continue PPI therapy. 

## 2022-06-24 NOTE — Progress Notes (Signed)
Pharmacy Antibiotic Note  Veronica Calhoun is a 37 y.o. female admitted on 06/23/2022 with  a past medical history of alcohol use disorder discharged from [facility] this morning presenting today with concern of abdominal pain and lower extremity swelling.  She was admitted to the hospital for the past 2 weeks after being diagnosed with alcohol induced pancreatitis. Marland Kitchen  Pharmacy has been consulted to dose vancomycin and cefepime for pna.  Plan: Vancomycin 2gm IV x 1 then 1512m q12h (AUC 536.1, Scr 0.8) Cefepime 2gm IV q8h Follow renal function and clinical course     Temp (24hrs), Avg:98.6 F (37 C), Min:98 F (36.7 C), Max:99.1 F (37.3 C)  Recent Labs  Lab 06/20/22 0428 06/21/22 0552 06/21/22 0659 06/22/22 0539 06/23/22 0527 06/23/22 2220  WBC 27.5*  --  26.0* 26.2* 22.7* 24.2*  CREATININE 0.48 0.40*  --  0.34* 0.46 0.44    Estimated Creatinine Clearance: 141.7 mL/min (by C-G formula based on SCr of 0.44 mg/dL).    Allergies  Allergen Reactions   Lisinopril Swelling    Reports Lip swelling   Amlodipine Other (See Comments)    "like I was going to pass out"    Antimicrobials this admission: 2/20 vanc >> 2/20 cefepime >> 2/20 azith >>   Dose adjustments this admission:   Microbiology results: 2/19 BCx:    Thank you for allowing pharmacy to be a part of this patient's care.  EDolly RiasRPh 06/24/2022, 12:08 AM

## 2022-06-24 NOTE — Assessment & Plan Note (Addendum)
-   The patient was admitted to a progressive unit bed. - Sepsis manifested by leukocytosis and tachypnea and is likely associated with left-sided parapneumonic effusion. - She has developed this pneumonia during her last admission. - We will continue IV antibiotic therapy with IV cefepime, vancomycin and Zithromax. - We will follow blood cultures. - Will be placed on mucolytic therapy as well as bronchodilator therapy.

## 2022-06-24 NOTE — Telephone Encounter (Signed)
Noted. We will see need to arrange for her to have a follow up with a couple of weeks. We can go ahead and get something scheduled or she can call us back when she is discharged to arrange follow-up. Whatever she prefers.

## 2022-06-24 NOTE — Consult Note (Signed)
Referring Provider: [facility] Primary Care Physician:  Pcp, No Primary Gastroenterologist:  Dr. Gala Romney  Reason for Consultation: Abdominal pain, pancreatitis, abnormal LFTs  HPI: Mell Bratten Kuhlmann is a 37 y.o. female with past medical history of anxiety hypertension alcohol use who was initially admitted to [facility] earlier this month for alcohol induced pancreatitis and abnormal LFTs.  She was discharged from [facility] yesterday morning and then she presented to the [facility] long ED yesterday evening with ongoing abdominal pain.  She was found to have low-grade fever.  Chest x-ray showed finding concerning for pneumonia.  She is admitted to the hospital for further management and GI is consulted because of anasarca and abnormal LFTs.  CT abdomen pelvis with contrast on June 15, 2022 showed moderate to severe acute pancreatitis, colonic ileus and hepatic steatosis.  CMP pending today.  LFTs as of yesterday showed T. bili was down to 6.3 alkaline phosphatase 219, AST 158 and ALT 79.  According to patient, she was having ongoing abdominal pain and weakness.  Also concerning for worsening lower extremity swelling.  Past Medical History:  Diagnosis Date   Anxiety 2013   Generalized anxiety disorder 04/01/2013   Hypertension 2014    Past Surgical History:  Procedure Laterality Date   CESAREAN SECTION  03/18/2007    Prior to Admission medications   Medication Sig Start Date End Date Taking? Authorizing Provider  albuterol (VENTOLIN HFA) 108 (90 Base) MCG/ACT inhaler Inhale 2 puffs into the lungs every 6 (six) hours as needed for wheezing or shortness of breath. 06/23/22  Yes Shah, Pratik D, DO  cloNIDine (CATAPRES) 0.2 MG tablet Take 1 tablet (0.2 mg total) by mouth 2 (two) times daily. 06/23/22 08/22/22 Yes Shah, Pratik D, DO  folic acid (FOLVITE) 1 MG tablet Take 1 tablet (1 mg total) by mouth daily. 06/23/22 07/23/22 Yes Shah, Pratik D, DO  guaiFENesin-dextromethorphan  (ROBITUSSIN DM) 100-10 MG/5ML syrup Take 15 mLs by mouth every 4 (four) hours as needed for cough. 06/23/22  Yes Shah, Pratik D, DO  ibuprofen (ADVIL) 200 MG tablet Take 200 mg by mouth every 6 (six) hours as needed for moderate pain.   Yes [provider]  metoprolol tartrate (LOPRESSOR) 50 MG tablet Take 1 tablet (50 mg total) by mouth 2 (two) times daily. 06/23/22 08/22/22 Yes Shah, Pratik D, DO  pantoprazole (PROTONIX) 40 MG tablet Take 1 tablet (40 mg total) by mouth daily. 06/23/22 06/23/23 Yes Shah, Pratik D, DO  polyethylene glycol (MIRALAX) 17 g packet Take 17 g by mouth daily as needed for mild constipation, moderate constipation or severe constipation. 06/23/22  Yes Shah, Pratik D, DO  thiamine (VITAMIN B-1) 100 MG tablet Take 1 tablet (100 mg total) by mouth daily. 06/23/22 07/23/22  Heath Lark D, DO    Scheduled Meds:  cloNIDine  0.2 mg Oral BID   enoxaparin (LOVENOX) injection  40 mg Subcutaneous [id number]   folic acid  1 mg Oral Daily   furosemide  40 mg Intravenous Q12H   metoprolol tartrate  50 mg Oral BID   nicotine  21 mg Transdermal Daily   pantoprazole  40 mg Oral Daily   thiamine  100 mg Oral Daily   Continuous Infusions:  azithromycin Stopped (06/24/22 0117)   ceFEPime (MAXIPIME) IV Stopped (06/24/22 0819)   vancomycin     PRN Meds:.acetaminophen **OR** acetaminophen, guaiFENesin-dextromethorphan, hydrALAZINE, HYDROmorphone (DILAUDID) injection, ipratropium-albuterol, magnesium hydroxide, metoprolol tartrate, ondansetron **OR** ondansetron (ZOFRAN) IV, polyethylene glycol, traZODone  Allergies as of  06/23/2022 - Review Complete 06/23/2022  Allergen Reaction Noted   Lisinopril Swelling 12/16/2018   Amlodipine Other (See Comments) 10/11/2018    Family History  Problem Relation Age of Onset   Heart murmur Father    AAA (abdominal aortic aneurysm) Sister    Urinary tract infection Daughter    Hypertension Maternal Grandmother     Social History    Socioeconomic History   Marital status: Single    Spouse name: Not on file   Number of children: 1   Years of education: Not on file   Highest education level: Some college, no degree  Occupational History   Not on file  Tobacco Use   Smoking status: Every Day    Packs/day: 1.00    Years: 10.00    Total pack years: 10.00    Types: Cigarettes   Smokeless tobacco: Never  Vaping Use   Vaping Use: Never used  Substance and Sexual Activity   Alcohol use: Yes    Alcohol/week: 10.0 standard drinks of alcohol    Types: 10 Cans of beer per week    Comment: 4-5 beers daily   Drug use: No   Sexual activity: Yes    Partners: Male    Birth control/protection: None  Other Topics Concern   Not on file  Social History Narrative   Lives in [place] with daughter   Has long term boyfriend.   Social Determinants of Health   Financial Resource Strain: Not on file  Food Insecurity: No Food Insecurity (06/10/2022)   Hunger Vital Sign    Worried About Running Out of Food in the Last Year: Never true    Ran Out of Food in the Last Year: Never true  Transportation Needs: No Transportation Needs (06/10/2022)   PRAPARE - Hydrologist (Medical): No    Lack of Transportation (Non-Medical): No  Physical Activity: Inactive (09/02/2017)   Exercise Vital Sign    Days of Exercise per Week: 0 days    Minutes of Exercise per Session: 0 min  Stress: Stress Concern Present (09/02/2017)   [place]    Feeling of Stress : To some extent  Social Connections: Not on file  Intimate Partner Violence: Not At Risk (06/10/2022)   Humiliation, Afraid, Rape, and Kick questionnaire    Fear of Current or Ex-Partner: No    Emotionally Abused: No    Physically Abused: No    Sexually Abused: No    Review of Systems: All negative except as stated above in HPI.  Physical Exam: Vital signs: Vitals:   06/24/22 0730  06/24/22 0856  BP: 139/86   Pulse: 81   Resp: 15   Temp:  98.8 F (37.1 C)  SpO2: 99%      General:   Alert,  Well-developed, well-nourished, pleasant and cooperative in NAD Lungs: No visible respiratory distress Heart:  Regular rate and rhythm; no murmurs, clicks, rubs,  or gallops. Abdomen: Not really distended, bowel sounds present, mild generalized discomfort on palpation, no peritoneal signs Lower extremity -1 + edema Neuro -alert and oriented x 3 Psych -mood and affect normal Rectal:  Deferred  GI:  Lab Results: Recent Labs    06/23/22 0527 06/23/22 2220 06/24/22 0614  WBC 22.7* 24.2* 21.6*  HGB 11.6* 10.6* 11.2*  HCT 34.8* 31.8* 34.2*  PLT 370 385 344   BMET Recent Labs    06/23/22 0527 06/23/22 2220 06/24/22 0614  NA 135  134* 135  K 3.5 3.8 3.6  CL 105 105 103  CO2 24 20* 21*  GLUCOSE 103* 96 96  BUN 6 5* <5*  CREATININE 0.46 0.44 0.43*  CALCIUM 8.2* 8.3* 8.4*   LFT Recent Labs    06/23/22 2220  PROT 6.3*  ALBUMIN 1.9*  AST 158*  ALT 79*  ALKPHOS 219*  BILITOT 6.3*   PT/INR Recent Labs    06/23/22 2325 06/24/22 0614  LABPROT 15.3* 15.4*  INR 1.2 1.2     Studies/Results: DG Chest Portable 1 View  Result Date: 06/23/2022 CLINICAL DATA:  Shortness of breath EXAM: PORTABLE CHEST 1 VIEW COMPARISON:  Chest CT 06/15/2022 FINDINGS: Mild cardiomegaly with vascular congestion and pulmonary edema. Suspected left pleural effusion. Airspace disease at the left base. IMPRESSION: Cardiomegaly with vascular congestion and pulmonary edema. Suspected left pleural effusion. Airspace disease at the left base may be due to atelectasis or pneumonia. Electronically Signed   By: Donavan Foil M.D.   On: 06/23/2022 22:49    Impression/Plan: -Abdominal pain in a patient with recent admission for moderate to severe pancreatitis. -Abnormal LFTs.  Likely from alcohol use. ?  Alcoholic  hepatitis. -Pneumonia -Anasarca.  Recommendations ------------------------ -Repeat CT abdomen pelvis with IV contrast for follow-up on moderate to severe pancreatitis. -Follow secondary marker for liver disease which was ordered by another GI group from [place] to have diet from GI standpoint.  GI will follow.  Patient was advised to follow-up with the GI group in [place] after discharge.    LOS: 1 day   Otis Brace  MD, FACP 06/24/2022, 9:12 AM  Contact #  [phone]

## 2022-06-24 NOTE — Assessment & Plan Note (Addendum)
-   She has not been drinking since she went home. - We will continue thiamine and folate as well as multivitamins.. - Her serum lipase is currently normal indicating resolved recent alcoholic pancreatitis.

## 2022-06-24 NOTE — Assessment & Plan Note (Addendum)
-   This is multifactorial due to underlying hepatic insufficiency and hypoalbuminemia as well as possibly developing acute CHF likely diastolic that could also be cor pulmonale. - She will be diuresed with IV Lasix. - 2D echo be obtained given her acute pulmonary edema. - Will defer cardiology consult to the morning hospitalist depending on echo results. - The patient will have a gastroenterology consult. - Dr. Therisa Doyne was notified about the patient and is aware.

## 2022-06-24 NOTE — Assessment & Plan Note (Signed)
-   We will continue her antihypertensives. 

## 2022-06-24 NOTE — Assessment & Plan Note (Signed)
-   This could be multifactorial due to congestive hepatopathy associated with her anasarca as well as alcoholic hepatitis. - GI consultation will be obtained as mentioned above. - We will follow LFTs with diuresis.

## 2022-06-24 NOTE — Progress Notes (Signed)
PROGRESS NOTE    Veronica Calhoun  [id number] DOB: 1985-08-11 DOA: 06/23/2022 PCP: Pcp, No   Brief Narrative:  37 year old with history of anxiety, HTN, alcohol abuse, generalized anxiety disorder discharged from [facility] about 2 weeks ago after being treated for alcohol induced pancreatitis.  Hospital course was complicated by hospital-acquired pneumonia and urinary tract infection.  After discharge she had increasing abdominal and lower extremity swelling and reported of orthopnea.  In the ER she was noted to have transaminitis with elevated total bilirubin, leukocytosis.  Chest x-ray was consistent with vascular congestion and pulmonary edema.  Patient was started on IV vancomycin and cefepime.   Assessment & Plan:  Principal Problem:   Sepsis due to pneumonia [facility]) Active Problems:   Anasarca   Alcohol abuse   Elevated LFTs   GERD without esophagitis   Essential hypertension   Hypoalbuminemia     Assessment and Plan: * Sepsis due to pneumonia ([place]) - Sepsis physiology is slowly improving.  Continue IV vancomycin, cefepime and azithromycin.  Check MRSA screen, if negative we can stop vancomycin. -As needed bronchodilators, I-S and flutter valve. Procal 0.8, Check BNP  Anasarca and pulmonary edema Transaminitis - Unclear etiology.  Echocardiogram ordered.  Lasix 40 mg IV twice daily started. -Elevated LFTs can be from liver congestion but also has alcohol abuse history.  Check acute hepatitis panel.  Will obtain liver ultrasound.  [facility] gastroenterology consulted.  Autoimmune workup pending.  Repeat CT abdomen pelvis  Alcohol abuse - Reports she has not been drinking over past 2 weeks.  Continue multivitamins, folate and thiamine.  Lipase is normal.  Will continue to monitor for any signs of alcohol withdrawal  GERD without esophagitis - Protonix daily  Essential hypertension - Clonidine, Lopressor.  Macrocytosis - Likely from alcohol use.  Will check TSH, B12 and  folate  DVT prophylaxis: Lovenox Code Status: Full code Family Communication:    Status is: Inpatient Ongoing workup for severe transaminitis with elevated total bilirubin      Subjective: Seen and examined at bedside, overall feels tired.  She does have bilateral lower extremity swelling and abdominal swelling as well.   Examination:  General exam: Appears calm and comfortable  Respiratory system: Clear to auscultation. Respiratory effort normal. Cardiovascular system: S1 & S2 heard, RRR. No JVD, murmurs, rubs, gallops or clicks.  3+ bilateral lower extremity pitting edema Gastrointestinal system: Abdomen is tender but distended Central nervous system: Alert and oriented. No focal neurological deficits. Extremities: Symmetric 5 x 5 power. Skin: No rashes, lesions or ulcers Psychiatry: Judgement and insight appear normal. Mood & affect appropriate.     Objective: Vitals:   06/24/22 0400 06/24/22 0514 06/24/22 0730 06/24/22 0856  BP: 131/87  139/86   Pulse: 72  81   Resp: 17  15   Temp:  98.9 F (37.2 C)  98.8 F (37.1 C)  TempSrc:  Oral  Oral  SpO2: 97%  99%     Intake/Output Summary (Last 24 hours) at 06/24/2022 0857 Last data filed at 06/24/2022 [id number] Gross per 24 hour  Intake 1210 ml  Output 2400 ml  Net -1190 ml   There were no vitals filed for this visit.   Data Reviewed:   CBC: Recent Labs  Lab 06/20/22 0428 06/21/22 0659 06/22/22 0539 06/23/22 0527 06/23/22 2220 06/24/22 0614  WBC 27.5* 26.0* 26.2* 22.7* 24.2* 21.6*  NEUTROABS 19.8*  --   --   --  18.6*  --   HGB 11.1* 10.8* 11.3*  11.6* 10.6* 11.2*  HCT 31.8* 32.4* 33.0* 34.8* 31.8* 34.2*  MCV 100.3* 102.2* 102.2* 102.4* 101.9* 103.3*  PLT 270 290 291 370 385 [id number]   Basic Metabolic Panel: Recent Labs  Lab 06/18/22 0436 06/19/22 0753 06/20/22 0428 06/21/22 0552 06/22/22 0539 06/23/22 0527 06/23/22 2220 06/24/22 0614  NA 137 134*   < > 136 136 135 134* 135  K 3.6 3.7   < > 4.0 4.2  3.5 3.8 3.6  CL 111 109   < > 109 107 105 105 103  CO2 20* 17*   < > 18* 19* 24 20* 21*  GLUCOSE 96 107*   < > 106* 97 103* 96 96  BUN 16 13   < > 10 7 6 $ 5* <5*  CREATININE 0.54 0.49   < > 0.40* 0.34* 0.46 0.44 0.43*  CALCIUM 8.0* 7.7*   < > 7.9* 8.0* 8.2* 8.3* 8.4*  MG 2.4 2.0  --  1.9  --   --   --   --    < > = values in this interval not displayed.   GFR: Estimated Creatinine Clearance: 141.7 mL/min (A) (by C-G formula based on SCr of 0.43 mg/dL (L)). Liver Function Tests: Recent Labs  Lab 06/20/22 0428 06/21/22 0552 06/22/22 0539 06/23/22 0527 06/23/22 2220  AST 105* 139* 153* 157* 158*  ALT 45* 64* 76* 84* 79*  ALKPHOS 115 135* 157* 205* 219*  BILITOT 7.7* 8.1* 7.8* 7.2* 6.3*  PROT 5.9* 6.1* 6.2* 7.0 6.3*  ALBUMIN 1.6* 1.7* 1.8* 1.9* 1.9*   Recent Labs  Lab 06/23/22 2220  LIPASE 46   No results for input(s): "AMMONIA" in the last 168 hours. Coagulation Profile: Recent Labs  Lab 06/20/22 0428 06/22/22 0539 06/23/22 0527 06/23/22 2325 06/24/22 0614  INR 1.3* 1.3* 1.3* 1.2 1.2   Cardiac Enzymes: No results for input(s): "CKTOTAL", "CKMB", "CKMBINDEX", "TROPONINI" in the last 168 hours. BNP (last 3 results) No results for input(s): "PROBNP" in the last 8760 hours. HbA1C: No results for input(s): "HGBA1C" in the last 72 hours. CBG: No results for input(s): "GLUCAP" in the last 168 hours. Lipid Profile: No results for input(s): "CHOL", "HDL", "LDLCALC", "TRIG", "CHOLHDL", "LDLDIRECT" in the last 72 hours. Thyroid Function Tests: No results for input(s): "TSH", "T4TOTAL", "FREET4", "T3FREE", "THYROIDAB" in the last 72 hours. Anemia Panel: Recent Labs    06/23/22 0527  FERRITIN 705*  TIBC 196*  IRON 67   Sepsis Labs: Recent Labs  Lab 06/23/22 2325 06/24/22 0614  PROCALCITON  --  0.80  LATICACIDVEN 0.9 1.0    No results found for this or any previous visit (from the past 240 hour(s)).       Radiology Studies: DG Chest Portable 1  View  Result Date: 06/23/2022 CLINICAL DATA:  Shortness of breath EXAM: PORTABLE CHEST 1 VIEW COMPARISON:  Chest CT 06/15/2022 FINDINGS: Mild cardiomegaly with vascular congestion and pulmonary edema. Suspected left pleural effusion. Airspace disease at the left base. IMPRESSION: Cardiomegaly with vascular congestion and pulmonary edema. Suspected left pleural effusion. Airspace disease at the left base may be due to atelectasis or pneumonia. Electronically Signed   By: Donavan Foil M.D.   On: 06/23/2022 22:49        Scheduled Meds:  cloNIDine  0.2 mg Oral BID   enoxaparin (LOVENOX) injection  40 mg Subcutaneous [id number]   folic acid  1 mg Oral Daily   furosemide  40 mg Intravenous Q12H   metoprolol tartrate  50 mg Oral BID  nicotine  21 mg Transdermal Daily   pantoprazole  40 mg Oral Daily   thiamine  100 mg Oral Daily   Continuous Infusions:  azithromycin Stopped (06/24/22 0117)   ceFEPime (MAXIPIME) IV Stopped (06/24/22 0819)   vancomycin       LOS: 1 day   Time spent= 35 mins    Jared Whorley Arsenio Loader, MD [facility]  If 7PM-7AM, please contact night-coverage  06/24/2022, 8:57 AM

## 2022-06-24 NOTE — ED Notes (Signed)
ED TO INPATIENT HANDOFF REPORT  Name/Age/Gender Veronica Calhoun 37 y.o. female  Code Status    Code Status Orders  (From admission, onward)           Start     Ordered   06/23/22 2341  Full code  Continuous       Question:  By:  Answer:  Consent: discussion documented in EHR   06/23/22 2344           Code Status History     Date Active Date Inactive Code Status Order ID Comments User Context   06/10/2022 1923 06/23/2022 1650 Full Code [id number]  Rolla Plate, DO ED   12/28/2021 2338 12/30/2021 2022 Full Code [id number]  Marcelyn Bruins, MD ED   07/19/2021 2314 07/20/2021 0617 Full Code [id number]  Moody Bruins ED       Home/SNF/Other Home  Chief Complaint Sepsis due to pneumonia ([place]) [J18.9, A41.9]  Level of Care/Admitting Diagnosis ED Disposition     ED Disposition  Admit   Condition  --   Comment  Hospital Area: [facility] [[id number]]  Level of Care: Progressive [102]  Admit to Progressive based on following criteria: Other see comments  Admit to Progressive based on following criteria: MULTISYSTEM THREATS such as stable sepsis, metabolic/electrolyte imbalance with or without encephalopathy that is responding to early treatment.  Comments: sepsis  May [facility] patient to [facility] if equivalent level of care is available:: No  Covid Evaluation: Recent COVID positive no isolation required infection day 21-90  Diagnosis: Sepsis due to pneumonia [facility]) [id number]  Admitting Physician: Christel Mormon [id number]  Attending Physician: Christel Mormon [id number]  Certification:: I certify this patient will need inpatient services for at least 2 midnights  Estimated Length of Stay: 3          Medical History Past Medical History:  Diagnosis Date   Anxiety 2013   Generalized anxiety disorder 04/01/2013   Hypertension 2014    Allergies Allergies  Allergen Reactions   Lisinopril Swelling    Reports Lip  swelling   Amlodipine Other (See Comments)    "like I was going to pass out"    IV Location/Drains/Wounds Patient Lines/Drains/Airways Status     Active Line/Drains/Airways     Name Placement date Placement time Site Days   Peripheral IV 06/23/22 20 G 1" Left Antecubital 06/23/22  2224  Antecubital  1            Labs/Imaging Results for orders placed or performed during the hospital encounter of 06/23/22 (from the past 48 hour(s))  Comprehensive metabolic panel     Status: Abnormal   Collection Time: 06/23/22 10:20 PM  Result Value Ref Range   Sodium 134 (L) 135 - 145 mmol/L   Potassium 3.8 3.5 - 5.1 mmol/L   Chloride 105 98 - 111 mmol/L   CO2 20 (L) 22 - 32 mmol/L   Glucose, Bld 96 70 - 99 mg/dL    Comment: Glucose reference range applies only to samples taken after fasting for at least 8 hours.   BUN 5 (L) 6 - 20 mg/dL   Creatinine, Ser 0.44 0.44 - 1.00 mg/dL   Calcium 8.3 (L) 8.9 - 10.3 mg/dL   Total Protein 6.3 (L) 6.5 - 8.1 g/dL   Albumin 1.9 (L) 3.5 - 5.0 g/dL   AST 158 (H) 15 - 41 U/L   ALT 79 (H) 0 - 44 U/L   Alkaline  Phosphatase 219 (H) 38 - 126 U/L   Total Bilirubin 6.3 (H) 0.3 - 1.2 mg/dL   GFR, Estimated >60 >60 mL/min    Comment: (NOTE) Calculated using the CKD-EPI Creatinine Equation (2021)    Anion gap 9 5 - 15    Comment: Performed at [facility], [place] [address], [place], [place] [address]  Lipase, blood     Status: None   Collection Time: 06/23/22 10:20 PM  Result Value Ref Range   Lipase 46 11 - 51 U/L    Comment: Performed at [facility], [place] [address] [place], [place] [address]  CBC with Diff     Status: Abnormal   Collection Time: 06/23/22 10:20 PM  Result Value Ref Range   WBC 24.2 (H) 4.0 - 10.5 K/uL   RBC 3.12 (L) 3.87 - 5.11 MIL/uL   Hemoglobin 10.6 (L) 12.0 - 15.0 g/dL   HCT 31.8 (L) 36.0 - 46.0 %   MCV 101.9 (H) 80.0 - 100.0 fL   MCH 34.0 26.0 - 34.0 pg   MCHC 33.3 30.0 - 36.0 g/dL   RDW  16.7 (H) 11.5 - 15.5 %   Platelets 385 150 - 400 K/uL   nRBC 0.0 0.0 - 0.2 %   Neutrophils Relative % 76 %   Neutro Abs 18.6 (H) 1.7 - 7.7 K/uL   Lymphocytes Relative 10 %   Lymphs Abs 2.5 0.7 - 4.0 K/uL   Monocytes Relative 6 %   Monocytes Absolute 1.4 (H) 0.1 - 1.0 K/uL   Eosinophils Relative 2 %   Eosinophils Absolute 0.4 0.0 - 0.5 K/uL   Basophils Relative 1 %   Basophils Absolute 0.1 0.0 - 0.1 K/uL   Immature Granulocytes 5 %   Abs Immature Granulocytes 1.25 (H) 0.00 - 0.07 K/uL    Comment: Performed at [facility], [place] [address], [place], [place] [address]  Lactic acid, plasma     Status: None   Collection Time: 06/23/22 11:25 PM  Result Value Ref Range   Lactic Acid, Venous 0.9 0.5 - 1.9 mmol/L    Comment: Performed at [facility], [place] [address] [place], [place] [address]  Protime-INR     Status: Abnormal   Collection Time: 06/23/22 11:25 PM  Result Value Ref Range   Prothrombin Time 15.3 (H) 11.4 - 15.2 seconds   INR 1.2 0.8 - 1.2    Comment: (NOTE) INR goal varies based on device and disease states. Performed at [facility], [place] [address], [place], [place] [address]   Brain natriuretic peptide     Status: Abnormal   Collection Time: 06/24/22  6:12 AM  Result Value Ref Range   B Natriuretic Peptide 477.3 (H) 0.0 - 100.0 pg/mL    Comment: Performed at [facility], [place] [address] [place], [place] [address]  Lactic acid, plasma     Status: None   Collection Time: 06/24/22  6:14 AM  Result Value Ref Range   Lactic Acid, Venous 1.0 0.5 - 1.9 mmol/L    Comment: Performed at [facility], [place] [address] [place], [place] [address]  Protime-INR     Status: Abnormal   Collection Time: 06/24/22  6:14 AM  Result Value Ref Range   Prothrombin Time 15.4 (H) 11.4 - 15.2 seconds   INR 1.2 0.8 - 1.2    Comment: (NOTE) INR goal varies based on device and disease  states. Performed at [facility]  [place] [address] [place], [place] [address]   Cortisol-am, blood     Status: None   Collection Time: 06/24/22  6:14 AM  Result Value Ref Range   Cortisol - AM 10.7 6.7 - 22.6 ug/dL    Comment: Performed at [facility], [place] [address], [place], [place] [address]  Procalcitonin     Status: None   Collection Time: 06/24/22  6:14 AM  Result Value Ref Range   Procalcitonin 0.80 ng/mL    Comment:        Interpretation: PCT > 0.5 ng/mL and <= 2 ng/mL: Systemic infection (sepsis) is possible, but other conditions are known to elevate PCT as well. (NOTE)       Sepsis PCT Algorithm           Lower Respiratory Tract                                      Infection PCT Algorithm    ----------------------------     ----------------------------         PCT < 0.25 ng/mL                PCT < 0.10 ng/mL          Strongly encourage             Strongly discourage   discontinuation of antibiotics    initiation of antibiotics    ----------------------------     -----------------------------       PCT 0.25 - 0.50 ng/mL            PCT 0.10 - 0.25 ng/mL               OR       >80% decrease in PCT            Discourage initiation of                                            antibiotics      Encourage discontinuation           of antibiotics    ----------------------------     -----------------------------         PCT >= 0.50 ng/mL              PCT 0.26 - 0.50 ng/mL                AND       <80% decrease in PCT             Encourage initiation of                                             antibiotics       Encourage continuation           of antibiotics    ----------------------------     -----------------------------        PCT >= 0.50 ng/mL                  PCT > 0.50 ng/mL               AND  increase in PCT                  Strongly encourage                                      initiation of antibiotics    Strongly encourage  escalation           of antibiotics                                     -----------------------------                                           PCT <= 0.25 ng/mL                                                 OR                                        > 80% decrease in PCT                                      Discontinue / Do not initiate                                             antibiotics  Performed at [place] [address] [place], [place] [id number]   Basic metabolic panel     Status: Abnormal   Collection Time: 06/24/22  6:14 AM  Result Value Ref Range   Sodium 135 135 - 145 mmol/L   Potassium 3.6 3.5 - 5.1 mmol/L   Chloride 103 98 - 111 mmol/L   CO2 21 (L) 22 - 32 mmol/L   Glucose, Bld 96 70 - 99 mg/dL    Comment: Glucose reference range applies only to samples taken after fasting for at least 8 hours.   BUN <5 (L) 6 - 20 mg/dL   Creatinine, Ser 0.43 (L) 0.44 - 1.00 mg/dL   Calcium 8.4 (L) 8.9 - 10.3 mg/dL   GFR, Estimated >60 >60 mL/min    Comment: (NOTE) Calculated using the CKD-EPI Creatinine Equation (2021)    Anion gap 11 5 - 15    Comment: Performed at [facility], [place] [address], [place], [place] [address]  CBC     Status: Abnormal   Collection Time: 06/24/22  6:14 AM  Result Value Ref Range   WBC 21.6 (H) 4.0 - 10.5 K/uL   RBC 3.31 (L) 3.87 - 5.11 MIL/uL   Hemoglobin 11.2 (L) 12.0 - 15.0 g/dL   HCT 34.2 (L) 36.0 - 46.0 %   MCV 103.3 (H) 80.0 - 100.0 fL   MCH 33.8 26.0 - 34.0 pg   MCHC 32.7 30.0 - 36.0 g/dL  RDW 16.7 (H) 11.5 - 15.5 %   Platelets 344 150 - 400 K/uL   nRBC 0.0 0.0 - 0.2 %    Comment: Performed at [facility], [place] [address], [place], [place] [address]  MRSA Next Gen by PCR, Nasal     Status: None   Collection Time: 06/24/22  9:07 AM   Specimen: Nasal Mucosa; Nasal Swab  Result Value Ref Range   MRSA by PCR Next Gen NOT DETECTED NOT DETECTED    Comment: (NOTE) The  GeneXpert MRSA Assay (FDA approved for NASAL specimens only), is one component of a comprehensive MRSA colonization surveillance program. It is not intended to diagnose MRSA infection nor to guide or monitor treatment for MRSA infections. Test performance is not FDA approved in patients less than 34 years old. Performed at [facility], [place] [address], [place], [place] [address]   Resp panel by RT-PCR (RSV, Flu A&B, Covid) Nasal Mucosa     Status: None   Collection Time: 06/24/22  9:07 AM   Specimen: Nasal Mucosa; Nasal Swab  Result Value Ref Range   SARS Coronavirus 2 by RT PCR NEGATIVE NEGATIVE    Comment: (NOTE) SARS-CoV-2 target nucleic acids are NOT DETECTED.  The SARS-CoV-2 RNA is generally detectable in upper respiratory specimens during the acute phase of infection. The lowest concentration of SARS-CoV-2 viral copies this assay can detect is 138 copies/mL. A negative result does not preclude SARS-Cov-2 infection and should not be used as the sole basis for treatment or other patient management decisions. A negative result may occur with  improper specimen collection/handling, submission of specimen other than nasopharyngeal swab, presence of viral mutation(s) within the areas targeted by this assay, and inadequate number of viral copies(<138 copies/mL). A negative result must be combined with clinical observations, patient history, and epidemiological information. The expected result is Negative.  Fact Sheet for Patients:  EntrepreneurPulse.com.au  Fact Sheet for Healthcare Providers:  IncredibleEmployment.be  This test is no t yet approved or cleared by the [place] FDA and  has been authorized for detection and/or diagnosis of SARS-CoV-2 by FDA under an Emergency Use Authorization (EUA). This EUA will remain  in effect (meaning this test can be used) for the duration of the COVID-19 declaration under Section  564(b)(1) of the Act, 21 U.S.C.section 360bbb-3(b)(1), unless the authorization is terminated  or revoked sooner.       Influenza A by PCR NEGATIVE NEGATIVE   Influenza B by PCR NEGATIVE NEGATIVE    Comment: (NOTE) The Xpert Xpress SARS-CoV-2/FLU/RSV plus assay is intended as an aid in the diagnosis of influenza from Nasopharyngeal swab specimens and should not be used as a sole basis for treatment. Nasal washings and aspirates are unacceptable for Xpert Xpress SARS-CoV-2/FLU/RSV testing.  Fact Sheet for Patients: EntrepreneurPulse.com.au  Fact Sheet for Healthcare Providers: IncredibleEmployment.be  This test is not yet approved or cleared by the [place] FDA and has been authorized for detection and/or diagnosis of SARS-CoV-2 by FDA under an Emergency Use Authorization (EUA). This EUA will remain in effect (meaning this test can be used) for the duration of the COVID-19 declaration under Section 564(b)(1) of the Act, 21 U.S.C. section 360bbb-3(b)(1), unless the authorization is terminated or revoked.     Resp Syncytial Virus by PCR NEGATIVE NEGATIVE    Comment: (NOTE) Fact Sheet for Patients: EntrepreneurPulse.com.au  Fact Sheet for Healthcare Providers: IncredibleEmployment.be  This test is not yet approved or cleared by the [place] FDA  and has been authorized for detection and/or diagnosis of SARS-CoV-2 by FDA under an Emergency Use Authorization (EUA). This EUA will remain in effect (meaning this test can be used) for the duration of the COVID-19 declaration under Section 564(b)(1) of the Act, 21 U.S.C. section 360bbb-3(b)(1), unless the authorization is terminated or revoked.  Performed at [facility], [place] [address] [place], [place] [address]    DG Chest Portable 1 View  Result Date: 06/23/2022 CLINICAL DATA:  Shortness of breath EXAM: PORTABLE CHEST 1 VIEW  COMPARISON:  Chest CT 06/15/2022 FINDINGS: Mild cardiomegaly with vascular congestion and pulmonary edema. Suspected left pleural effusion. Airspace disease at the left base. IMPRESSION: Cardiomegaly with vascular congestion and pulmonary edema. Suspected left pleural effusion. Airspace disease at the left base may be due to atelectasis or pneumonia. Electronically Signed   By: Donavan Foil M.D.   On: 06/23/2022 22:49    Pending Labs Unresulted Labs (From admission, onward)     Start     Ordered   06/25/22 0500  CBC  Daily,   R      06/24/22 0905   06/25/22 0500  Comprehensive metabolic panel  Daily,   R      06/24/22 0905   06/25/22 0500  Magnesium  Daily,   R      06/24/22 0905   06/24/22 0907  Folate  Add-on,   AD        06/24/22 0906   06/24/22 0907  Vitamin B12  Add-on,   AD        06/24/22 0906   06/24/22 0906  Hepatitis panel, acute  Once,   R        06/24/22 0905   06/23/22 2238  Blood culture (routine x 2)  BLOOD CULTURE X 2,   R (with STAT occurrences)      06/23/22 2247   06/23/22 2238  Brain natriuretic peptide  Once,   URGENT        06/23/22 2247            Vitals/Pain Today's Vitals   06/24/22 0730 06/24/22 0856 06/24/22 0931 06/24/22 1000  BP: 139/86   (!) 115/56  Pulse: 81   95  Resp: 15   16  Temp:  98.8 F (37.1 C)    TempSrc:  Oral    SpO2: 99%   100%  PainSc:   4      Isolation Precautions No active isolations  Medications Medications  cloNIDine (CATAPRES) tablet 0.2 mg (0.2 mg Oral Given 06/24/22 0905)  metoprolol tartrate (LOPRESSOR) tablet 50 mg (50 mg Oral Given 06/24/22 0904)  pantoprazole (PROTONIX) EC tablet 40 mg (40 mg Oral Given 06/24/22 0904)  polyethylene glycol (MIRALAX / GLYCOLAX) packet 17 g (has no administration in time range)  folic acid (FOLVITE) tablet 1 mg (1 mg Oral Given 06/24/22 0904)  thiamine (VITAMIN B1) tablet 100 mg (100 mg Oral Given 06/24/22 0904)  guaiFENesin-dextromethorphan (ROBITUSSIN DM) 100-10 MG/5ML syrup 15 mL  (has no administration in time range)  enoxaparin (LOVENOX) injection 40 mg (40 mg Subcutaneous Given 06/24/22 0019)  azithromycin (ZITHROMAX) 500 mg in sodium chloride 0.9 % 250 mL IVPB (0 mg Intravenous Stopped 06/24/22 0117)  acetaminophen (TYLENOL) tablet 650 mg (has no administration in time range)    Or  acetaminophen (TYLENOL) suppository 650 mg (has no administration in time range)  magnesium hydroxide (MILK OF MAGNESIA) suspension 30 mL (has no administration in time range)  ondansetron (ZOFRAN) tablet 4  mg (has no administration in time range)    Or  ondansetron (ZOFRAN) injection 4 mg (has no administration in time range)  furosemide (LASIX) injection 40 mg (40 mg Intravenous Given 06/24/22 0729)  ceFEPIme (MAXIPIME) 2 g in sodium chloride 0.9 % 100 mL IVPB (0 g Intravenous Stopped 06/24/22 0819)  HYDROmorphone (DILAUDID) injection 1 mg (1 mg Intravenous Given 06/24/22 0907)  vancomycin (VANCOREADY) IVPB 1500 mg/300 mL (has no administration in time range)  nicotine (NICODERM CQ - dosed in mg/24 hours) patch 21 mg (21 mg Transdermal Patch Applied 06/24/22 0807)  ipratropium-albuterol (DUONEB) 0.5-2.5 (3) MG/3ML nebulizer solution 3 mL (has no administration in time range)  hydrALAZINE (APRESOLINE) injection 10 mg (has no administration in time range)  metoprolol tartrate (LOPRESSOR) injection 5 mg (has no administration in time range)  traZODone (DESYREL) tablet 50 mg (has no administration in time range)  HYDROmorphone (DILAUDID) injection 1 mg (1 mg Intravenous Given 06/23/22 2224)  furosemide (LASIX) injection 40 mg (40 mg Intravenous Given 06/23/22 2322)  ceFEPIme (MAXIPIME) 2 g in sodium chloride 0.9 % 100 mL IVPB (0 g Intravenous Stopped 06/23/22 2357)  vancomycin (VANCOREADY) IVPB 2000 mg/400 mL (0 mg Intravenous Stopped 06/24/22 0214)  ibuprofen (ADVIL) tablet 600 mg (600 mg Oral Given 06/24/22 1038)    Mobility walks

## 2022-06-25 DIAGNOSIS — A419 Sepsis, unspecified organism: Secondary | ICD-10-CM | POA: Diagnosis not present

## 2022-06-25 DIAGNOSIS — J189 Pneumonia, unspecified organism: Secondary | ICD-10-CM | POA: Diagnosis not present

## 2022-06-25 LAB — COMPREHENSIVE METABOLIC PANEL WITH GFR
ALT: 88 U/L — ABNORMAL HIGH (ref 0–44)
AST: 186 U/L — ABNORMAL HIGH (ref 15–41)
Albumin: 2.1 g/dL — ABNORMAL LOW (ref 3.5–5.0)
Alkaline Phosphatase: 233 U/L — ABNORMAL HIGH (ref 38–126)
Anion gap: 11 (ref 5–15)
BUN: 6 mg/dL (ref 6–20)
CO2: 23 mmol/L (ref 22–32)
Calcium: 8 mg/dL — ABNORMAL LOW (ref 8.9–10.3)
Chloride: 100 mmol/L (ref 98–111)
Creatinine, Ser: 0.53 mg/dL (ref 0.44–1.00)
GFR, Estimated: >60 mL/min
Glucose, Bld: 95 mg/dL (ref 70–99)
Potassium: 3.4 mmol/L — ABNORMAL LOW (ref 3.5–5.1)
Sodium: 134 mmol/L — ABNORMAL LOW (ref 135–145)
Total Bilirubin: 5.4 mg/dL — ABNORMAL HIGH (ref 0.3–1.2)
Total Protein: 6.3 g/dL — ABNORMAL LOW (ref 6.5–8.1)

## 2022-06-25 LAB — CBC
HCT: 31.1 % — ABNORMAL LOW (ref 36.0–46.0)
Hemoglobin: 10.3 g/dL — ABNORMAL LOW (ref 12.0–15.0)
MCH: 34.4 pg — ABNORMAL HIGH (ref 26.0–34.0)
MCHC: 33.1 g/dL (ref 30.0–36.0)
MCV: 104 fL — ABNORMAL HIGH (ref 80.0–100.0)
Platelets: 369 10*3/uL (ref 150–400)
RBC: 2.99 MIL/uL — ABNORMAL LOW (ref 3.87–5.11)
RDW: 16.5 % — ABNORMAL HIGH (ref 11.5–15.5)
WBC: 19.6 10*3/uL — ABNORMAL HIGH (ref 4.0–10.5)
nRBC: 0.1 % (ref 0.0–0.2)

## 2022-06-25 LAB — MAGNESIUM: Magnesium: 1.7 mg/dL (ref 1.7–2.4)

## 2022-06-25 MED ORDER — POTASSIUM CHLORIDE CRYS ER 20 MEQ PO TBCR
40.0000 meq | EXTENDED_RELEASE_TABLET | Freq: Once | ORAL | Status: AC
  Administered 2022-06-25: 40 meq via ORAL
  Filled 2022-06-25: qty 2

## 2022-06-25 MED ORDER — AZITHROMYCIN 250 MG PO TABS
500.0000 mg | ORAL_TABLET | Freq: Every day | ORAL | Status: DC
  Administered 2022-06-25: 500 mg via ORAL
  Filled 2022-06-25: qty 2

## 2022-06-25 MED ORDER — MAGNESIUM OXIDE -MG SUPPLEMENT 400 (240 MG) MG PO TABS
800.0000 mg | ORAL_TABLET | Freq: Once | ORAL | Status: AC
  Administered 2022-06-25: 800 mg via ORAL
  Filled 2022-06-25: qty 2

## 2022-06-25 NOTE — Progress Notes (Signed)
[facility] Progress Note  Veronica Calhoun 37 y.o. 07/27/1985  CC: Pancreatitis, abnormal LFTs, ascites   Subjective: Patient seen and examined at bedside.  She is feeling much better today.  Denies nausea or vomiting.  ROS : afebrile, negative for chest pain   Objective: Vital signs in last 24 hours: Vitals:   06/25/22 0045 06/25/22 0432  BP: 128/84 (!) 151/86  Pulse: 87 90  Resp: 20 20  Temp: 98.9 F (37.2 C) 98.5 F (36.9 C)  SpO2: 100% 100%    Physical Exam:  General:   Alert,  Well-developed, well-nourished, pleasant and cooperative in NAD Lungs: No visible respiratory distress Heart:  Regular rate and rhythm; no murmurs, clicks, rubs,  or gallops. Abdomen: Not really distended, bowel sounds present, mild generalized discomfort on palpation, no peritoneal signs Lower extremity -1 + edema Neuro -alert and oriented x 3 Psych -mood and affect normal Rectal:  Deferred  Lab Results: Recent Labs    06/24/22 0614 06/25/22 0212  NA 135 134*  K 3.6 3.4*  CL 103 100  CO2 21* 23  GLUCOSE 96 95  BUN <5* 6  CREATININE 0.43* 0.53  CALCIUM 8.4* 8.0*  MG  --  1.7   Recent Labs    06/23/22 2220 06/25/22 0212  AST 158* 186*  ALT 79* 88*  ALKPHOS 219* 233*  BILITOT 6.3* 5.4*  PROT 6.3* 6.3*  ALBUMIN 1.9* 2.1*   Recent Labs    06/23/22 2220 06/24/22 0614 06/25/22 0212  WBC 24.2* 21.6* 19.6*  NEUTROABS 18.6*  --   --   HGB 10.6* 11.2* 10.3*  HCT 31.8* 34.2* 31.1*  MCV 101.9* 103.3* 104.0*  PLT 385 344 369   Recent Labs    06/23/22 2325 06/24/22 0614  LABPROT 15.3* 15.4*  INR 1.2 1.2      Assessment/Plan: -Abdominal pain in a patient with recent admission for moderate to severe pancreatitis. -Abnormal LFTs.  Likely from alcohol use. ?  Alcoholic hepatitis.or from pancreatitis  -Pneumonia -Volume overload.  Could be related to recent treatment of pancreatitis.    Recommendations ------------------------ -CT abdomen pelvis with  IV contrast yesterday showed improvement in pancreatitis.  Now have interstitial pancreatitis.  No evidence of necrosis. -Recommend low salt diet. -LFTs improving.  No further inpatient GI workup planned.   -GI will sign off. - Patient was advised to follow-up with the GI group in [place] after discharge.  -Discussed with Dr. Reesa Chew.   Otis Brace MD, [place] 06/25/2022, 10:09 AM  Contact #  [phone]

## 2022-06-25 NOTE — TOC Initial Note (Signed)
Transition of Care [facility]) - Initial/Assessment Note    Patient Details  Name: Veronica Calhoun MRN: [id number] Date of Birth: [birth year]  Transition of [facility]) CM/SW Contact:    Dessa Phi, RN Phone Number: 06/25/2022, 11:45 AM  Clinical Narrative:Monitor for d/c plans.                   Expected Discharge Plan: Home/Self Care Barriers to Discharge: Continued Medical Work up   Patient Goals and CMS Choice Patient states their goals for this hospitalization and ongoing recovery are::  (Home)          Expected Discharge Plan and Services                                              Prior Living Arrangements/Services                       Activities of Daily Living Home Assistive Devices/Equipment: None ADL Screening (condition at time of admission) Patient's cognitive ability adequate to safely complete daily activities?: Yes Is the patient deaf or have difficulty hearing?: No Does the patient have difficulty seeing, even when wearing glasses/contacts?: No Does the patient have difficulty concentrating, remembering, or making decisions?: No Patient able to express need for assistance with ADLs?: Yes Does the patient have difficulty dressing or bathing?: No Independently performs ADLs?: Yes (appropriate for developmental age) Does the patient have difficulty walking or climbing stairs?: Yes Weakness of Legs: Both Weakness of Arms/Hands: None  Permission Sought/Granted                  Emotional Assessment              Admission diagnosis:  Anasarca [R60.1] Acute pulmonary edema ([place]) [J81.0] Hypoalbuminemia [E88.09] Transaminitis [R74.01] Sepsis due to pneumonia ([place]) [J18.9, A41.9] Patient Active Problem List   Diagnosis Date Noted   GERD without esophagitis 06/24/2022   Alcohol abuse 06/24/2022   Elevated LFTs 06/24/2022   Hypoalbuminemia 06/24/2022   Sepsis due to pneumonia ([place]) 06/23/2022   Anasarca [id number]    Alcoholic hepatitis without ascites [id number]   Alcoholic hepatitis with ascites 06/16/2022   Abdominal pain, epigastric 06/16/2022   Ileus ([place]) 06/16/2022   Tobacco use disorder 06/11/2022   Tachycardia 06/11/2022   Hypokalemia 06/11/2022   Alcohol induced acute pancreatitis 06/10/2022   Hypertensive crisis 06/10/2022   Transaminitis 06/10/2022   Obesity, Class III, BMI 40-49.9 (morbid obesity) ([place]) 12/29/2021   Acute pancreatitis 12/29/2021   Pancreatitis 12/28/2021   Leukocytosis 12/28/2021   ASCUS with positive high risk HPV cervical 07/21/2019   Warts, genital 07/21/2019   Depression 04/15/2019   Alcohol use disorder, severe, dependence ([place]) 05/20/2018   Panic attacks 04/01/2013   Essential hypertension 05/05/2012   Anxiety 05/06/2011   PCP:  Pcp, No Pharmacy:   [facility] [phone] - [place], [place] [address] [place] [id number] [place] [address] Phone: [phone] Fax: [phone]     Social Determinants of Health (SDOH) Social History: SDOH Screenings   Food Insecurity: No Food Insecurity (06/24/2022)  Housing: Low Risk  (06/24/2022)  Transportation Needs: No Transportation Needs (06/24/2022)  Utilities: Not At Risk (06/24/2022)  Physical Activity: Inactive (09/02/2017)  Stress: Stress Concern Present (09/02/2017)  Tobacco Use: High Risk (06/24/2022)   SDOH Interventions:  Readmission Risk Interventions     No data to display

## 2022-06-25 NOTE — Progress Notes (Signed)
PROGRESS NOTE    Veronica Calhoun  [id number] DOB: 1985/11/09 DOA: 06/23/2022 PCP: Pcp, No   Brief Narrative:  37 year old with history of anxiety, HTN, alcohol abuse, generalized anxiety disorder discharged from [facility] about 2 weeks ago after being treated for alcohol induced pancreatitis.  Hospital course was complicated by hospital-acquired pneumonia and urinary tract infection.  After discharge she had increasing abdominal and lower extremity swelling and reported of orthopnea.  In the ER she was noted to have transaminitis with elevated total bilirubin, leukocytosis.  Chest x-ray was consistent with vascular congestion and pulmonary edema.  Patient was started on IV vancomycin and cefepime.  Repeat CT scan shows persistent evidence of pancreatitis and hepatic steatosis   Assessment & Plan:  Principal Problem:   Sepsis due to pneumonia [facility]) Active Problems:   Anasarca   Alcohol abuse   Elevated LFTs   GERD without esophagitis   Essential hypertension   Hypoalbuminemia     Assessment and Plan: * Sepsis due to pneumonia ([facility]) - Sepsis physiology is slowly improving.  Continue IV vancomycin, cefepime and azithromycin.  MRSA neg, stop vanc.  -As needed bronchodilators, I-S and flutter valve. Procal 0.8, elevated BNP  Anasarca and pulmonary edema Transaminitis, hepatic steatosis - Unclear etiology.  Echocardiogram ordered.  Lasix 40 mg IV twice daily. -Elevated LFTs can be from liver congestion but also has alcohol abuse history.  Acute hepatitis panel negative.  [facility] following.  -Recent autoimmune workup-negative  Alcohol abuse Persistent interstitial pancreatitis - Reports she has not been drinking over past 2 weeks.  Continue multivitamins, folate and thiamine.  Lipase is normal.  Will continue to monitor for any signs of alcohol withdrawal  GERD without esophagitis - Protonix daily  Essential hypertension - Clonidine, Lopressor.  Macrocytosis - Likely  from alcohol use.  B12 and folate are normal.  TSH about 2 weeks ago was 4.5.  Elevated TSH - Check free T4.  Will need repeat in about 4 to 6 weeks with PCP  DVT prophylaxis: Lovenox Code Status: Full code Family Communication:    Status is: Inpatient Ongoing workup for severe transaminitis with elevated total bilirubin If continues to tolerate p.o., plans to DC tomorrow     Subjective: Seen and examined at bedside, no complaints.  Ambulating, slowly tolerating orals.  Examination:  Constitutional: Not in acute distress Respiratory: Clear to auscultation bilaterally Cardiovascular: Normal sinus rhythm, no rubs Abdomen: Nontender nondistended good bowel sounds Musculoskeletal: No edema noted Skin: No rashes seen Neurologic: CN 2-12 grossly intact.  And nonfocal Psychiatric: Normal judgment and insight. Alert and oriented x 3. Normal mood.  Objective: Vitals:   06/24/22 1642 06/24/22 2006 06/25/22 0045 06/25/22 0432  BP: 127/82 135/88 128/84 (!) 151/86  Pulse: 86 89 87 90  Resp: 18 20 20 20  $ Temp: 98.1 F (36.7 C) 98.4 F (36.9 C) 98.9 F (37.2 C) 98.5 F (36.9 C)  TempSrc: Oral Oral Oral Oral  SpO2: 100% 100% 100% 100%  Weight:      Height:        Intake/Output Summary (Last 24 hours) at 06/25/2022 0746 Last data filed at 06/25/2022 0500 Gross per 24 hour  Intake 980.12 ml  Output 2450 ml  Net -1469.88 ml   Filed Weights   06/24/22 1525  Weight: (!) 137.5 kg     Data Reviewed:   CBC: Recent Labs  Lab 06/20/22 0428 06/21/22 0659 06/22/22 0539 06/23/22 0527 06/23/22 2220 06/24/22 0614 06/25/22 0212  WBC 27.5*   < >  26.2* 22.7* 24.2* 21.6* 19.6*  NEUTROABS 19.8*  --   --   --  18.6*  --   --   HGB 11.1*   < > 11.3* 11.6* 10.6* 11.2* 10.3*  HCT 31.8*   < > 33.0* 34.8* 31.8* 34.2* 31.1*  MCV 100.3*   < > 102.2* 102.4* 101.9* 103.3* 104.0*  PLT 270   < > 291 370 385 344 369   < > = values in this interval not displayed.   Basic Metabolic  Panel: Recent Labs  Lab 06/19/22 0753 06/20/22 0428 06/21/22 0552 06/22/22 0539 06/23/22 0527 06/23/22 2220 06/24/22 0614 06/25/22 0212  NA 134*   < > 136 136 135 134* 135 134*  K 3.7   < > 4.0 4.2 3.5 3.8 3.6 3.4*  CL 109   < > 109 107 105 105 103 100  CO2 17*   < > 18* 19* 24 20* 21* 23  GLUCOSE 107*   < > 106* 97 103* 96 96 95  BUN 13   < > 10 7 6 $ 5* <5* 6  CREATININE 0.49   < > 0.40* 0.34* 0.46 0.44 0.43* 0.53  CALCIUM 7.7*   < > 7.9* 8.0* 8.2* 8.3* 8.4* 8.0*  MG 2.0  --  1.9  --   --   --   --  1.7   < > = values in this interval not displayed.   GFR: Estimated Creatinine Clearance: 141.7 mL/min (by C-G formula based on SCr of 0.53 mg/dL). Liver Function Tests: Recent Labs  Lab 06/21/22 0552 06/22/22 0539 06/23/22 0527 06/23/22 2220 06/25/22 0212  AST 139* 153* 157* 158* 186*  ALT 64* 76* 84* 79* 88*  ALKPHOS 135* 157* 205* 219* 233*  BILITOT 8.1* 7.8* 7.2* 6.3* 5.4*  PROT 6.1* 6.2* 7.0 6.3* 6.3*  ALBUMIN 1.7* 1.8* 1.9* 1.9* 2.1*   Recent Labs  Lab 06/23/22 2220  LIPASE 46   No results for input(s): "AMMONIA" in the last 168 hours. Coagulation Profile: Recent Labs  Lab 06/20/22 0428 06/22/22 0539 06/23/22 0527 06/23/22 2325 06/24/22 0614  INR 1.3* 1.3* 1.3* 1.2 1.2   Cardiac Enzymes: No results for input(s): "CKTOTAL", "CKMB", "CKMBINDEX", "TROPONINI" in the last 168 hours. BNP (last 3 results) No results for input(s): "PROBNP" in the last 8760 hours. HbA1C: No results for input(s): "HGBA1C" in the last 72 hours. CBG: No results for input(s): "GLUCAP" in the last 168 hours. Lipid Profile: No results for input(s): "CHOL", "HDL", "LDLCALC", "TRIG", "CHOLHDL", "LDLDIRECT" in the last 72 hours. Thyroid Function Tests: No results for input(s): "TSH", "T4TOTAL", "FREET4", "T3FREE", "THYROIDAB" in the last 72 hours. Anemia Panel: Recent Labs    06/23/22 0527 06/24/22 1323  VITAMINB12  --  937*  FOLATE  --  10.4  FERRITIN 705*  --   TIBC 196*   --   IRON 67  --    Sepsis Labs: Recent Labs  Lab 06/23/22 2325 06/24/22 0614  PROCALCITON  --  0.80  LATICACIDVEN 0.9 1.0    Recent Results (from the past 240 hour(s))  Blood culture (routine x 2)     Status: None (Preliminary result)   Collection Time: 06/23/22 11:20 PM   Specimen: BLOOD  Result Value Ref Range Status   Specimen Description   Final    BLOOD BLOOD LEFT ARM Performed at [facility] [address], [place], [place] [address]    Special Requests   Final    BOTTLES DRAWN AEROBIC AND  ANAEROBIC Blood Culture adequate volume Performed at [facility] [address], [place], [place] [address]    Culture   Final    NO GROWTH 1 DAY Performed at [facility], [place] [address], [place], [place] [address]    Report Status PENDING  Incomplete  Blood culture (routine x 2)     Status: None (Preliminary result)   Collection Time: 06/23/22 11:25 PM   Specimen: BLOOD  Result Value Ref Range Status   Specimen Description   Final    BLOOD BLOOD RIGHT FOREARM Performed at [facility] [address], [place], [place] [address]    Special Requests   Final    BOTTLES DRAWN AEROBIC AND ANAEROBIC Blood Culture adequate volume Performed at [facility] [address] [place], [place] [address]    Culture   Final    NO GROWTH 1 DAY Performed at [facility], [place] [address] [place],  [address]    Report Status PENDING  Incomplete  MRSA Next Gen by PCR, Nasal     Status: None   Collection Time: 06/24/22  9:07 AM   Specimen: Nasal Mucosa; Nasal Swab  Result Value Ref Range Status   MRSA by PCR Next Gen NOT DETECTED NOT DETECTED Final    Comment: (NOTE) The GeneXpert MRSA Assay (FDA approved for NASAL specimens only), is one component of a comprehensive MRSA colonization surveillance program. It is not intended to diagnose MRSA infection nor to guide or monitor treatment for  MRSA infections. Test performance is not FDA approved in patients less than 45 years old. Performed at [facility], [place] [address], [place],  [address]   Resp panel by RT-PCR (RSV, Flu A&B, Covid) Nasal Mucosa     Status: None   Collection Time: 06/24/22  9:07 AM   Specimen: Nasal Mucosa; Nasal Swab  Result Value Ref Range Status   SARS Coronavirus 2 by RT PCR NEGATIVE NEGATIVE Final    Comment: (NOTE) SARS-CoV-2 target nucleic acids are NOT DETECTED.  The SARS-CoV-2 RNA is generally detectable in upper respiratory specimens during the acute phase of infection. The lowest concentration of SARS-CoV-2 viral copies this assay can detect is 138 copies/mL. A negative result does not preclude SARS-Cov-2 infection and should not be used as the sole basis for treatment or other patient management decisions. A negative result may occur with  improper specimen collection/handling, submission of specimen other than nasopharyngeal swab, presence of viral mutation(s) within the areas targeted by this assay, and inadequate number of viral copies(<138 copies/mL). A negative result must be combined with clinical observations, patient history, and epidemiological information. The expected result is Negative.  Fact Sheet for Patients:  EntrepreneurPulse.com.au  Fact Sheet for Healthcare Providers:  IncredibleEmployment.be  This test is no t yet approved or cleared by the [place] FDA and  has been authorized for detection and/or diagnosis of SARS-CoV-2 by FDA under an Emergency Use Authorization (EUA). This EUA will remain  in effect (meaning this test can be used) for the duration of the COVID-19 declaration under Section 564(b)(1) of the Act, 21 U.S.C.section 360bbb-3(b)(1), unless the authorization is terminated  or revoked sooner.       Influenza A by PCR NEGATIVE NEGATIVE Final   Influenza B by PCR NEGATIVE NEGATIVE  Final    Comment: (NOTE) The Xpert Xpress SARS-CoV-2/FLU/RSV plus assay is intended as an aid in the diagnosis of influenza from Nasopharyngeal swab specimens  and should not be used as a sole basis for treatment. Nasal washings and aspirates are unacceptable for Xpert Xpress SARS-CoV-2/FLU/RSV testing.  Fact Sheet for Patients: EntrepreneurPulse.com.au  Fact Sheet for Healthcare Providers: IncredibleEmployment.be  This test is not yet approved or cleared by the [place] FDA and has been authorized for detection and/or diagnosis of SARS-CoV-2 by FDA under an Emergency Use Authorization (EUA). This EUA will remain in effect (meaning this test can be used) for the duration of the COVID-19 declaration under Section 564(b)(1) of the Act, 21 U.S.C. section 360bbb-3(b)(1), unless the authorization is terminated or revoked.     Resp Syncytial Virus by PCR NEGATIVE NEGATIVE Final    Comment: (NOTE) Fact Sheet for Patients: EntrepreneurPulse.com.au  Fact Sheet for Healthcare Providers: IncredibleEmployment.be  This test is not yet approved or cleared by the [place] FDA and has been authorized for detection and/or diagnosis of SARS-CoV-2 by FDA under an Emergency Use Authorization (EUA). This EUA will remain in effect (meaning this test can be used) for the duration of the COVID-19 declaration under Section 564(b)(1) of the Act, 21 U.S.C. section 360bbb-3(b)(1), unless the authorization is terminated or revoked.  Performed at [facility], [place] [address], [place], [place] [address]          Radiology Studies: CT ABDOMEN PELVIS W CONTRAST  Result Date: 06/24/2022 CLINICAL DATA:  Persistent pancreatitis. EXAM: CT ABDOMEN AND PELVIS WITH CONTRAST TECHNIQUE: Multidetector CT imaging of the abdomen and pelvis was performed using the standard protocol following bolus administration of  intravenous contrast. RADIATION DOSE REDUCTION: This exam was performed according to the departmental dose-optimization program which includes automated exposure control, adjustment of the mA and/or kV according to patient size and/or use of iterative reconstruction technique. CONTRAST:  162m OMNIPAQUE IOHEXOL 300 MG/ML  SOLN COMPARISON:  Multiple priors including CT June 15, 2022. FINDINGS: Lower chest: Trace bilateral pleural effusions with adjacent airspace opacities. Mild bibasilar interstitial thickening. Hepatobiliary: Hepatomegaly with diffuse hepatic steatosis. Gallbladder is unremarkable. No biliary ductal dilation. Pancreas: Similar edematous appearance of the pancreas without discrete necrosis. Peripancreatic fluid extends along the retroperitoneum into the pelvis with increased now small-moderate moderate volume of pelvic fluid. Fluid additionally extends along mesenteric root. No walled off fluid collection. No pancreatic ductal dilation. Spleen: No splenomegaly. Adrenals/Urinary Tract: Bilateral adrenal glands appear normal. No hydronephrosis. Kidneys demonstrate symmetric enhancement. Urinary bladder is unremarkable for degree of distension Stomach/Bowel: Reactive inflammation of the stomach and duodenal similar prior. No pathologic dilation of small or large bowel. Vascular/Lymphatic: Normal caliber abdominal aorta. Smooth IVC contours. No pathologically enlarged abdominal or pelvic lymph nodes Reproductive: Uterus and bilateral adnexa are unremarkable. Other: Similar anasarca with fluid pooling in the bilateral flanks Musculoskeletal: No acute osseous abnormality. IMPRESSION: 1. Persistent interstitial pancreatitis without evidence of necrosis. No walled-off fluid collection or pancreatic ductal dilation. Increased volume pelvic free fluid which extends retroperitoneal from the peripancreatic fluid. 2. Trace bilateral pleural effusions with adjacent airspace opacities, favored to reflect  atelectasis. 3. Hepatomegaly with diffuse hepatic steatosis. Electronically Signed   By: JDahlia BailiffM.D.   On: 06/24/2022 12:24   DG Chest Portable 1 View  Result Date: 06/23/2022 CLINICAL DATA:  Shortness of breath EXAM: PORTABLE CHEST 1 VIEW COMPARISON:  Chest CT 06/15/2022 FINDINGS: Mild cardiomegaly with vascular congestion and pulmonary edema. Suspected left pleural effusion. Airspace disease at the left base. IMPRESSION: Cardiomegaly with vascular congestion and pulmonary edema. Suspected left pleural effusion. Airspace disease at the left base may be  due to atelectasis or pneumonia. Electronically Signed   By: Donavan Foil M.D.   On: 06/23/2022 22:49        Scheduled Meds:  cloNIDine  0.2 mg Oral BID   enoxaparin (LOVENOX) injection  40 mg Subcutaneous [id number]   folic acid  1 mg Oral Daily   furosemide  40 mg Intravenous Q12H   metoprolol tartrate  50 mg Oral BID   nicotine  21 mg Transdermal Daily   pantoprazole  40 mg Oral Daily   thiamine  100 mg Oral Daily   Continuous Infusions:  azithromycin Stopped (06/25/22 0312)   ceFEPime (MAXIPIME) IV Stopped (06/25/22 0046)   vancomycin 1,500 mg (06/25/22 0322)     LOS: 2 days   Time spent= 35 mins    [name] Crothers Arsenio Loader, MD [facility]  If 7PM-7AM, please contact night-coverage  06/25/2022, 7:46 AM

## 2022-06-26 DIAGNOSIS — A419 Sepsis, unspecified organism: Secondary | ICD-10-CM | POA: Diagnosis not present

## 2022-06-26 DIAGNOSIS — J189 Pneumonia, unspecified organism: Secondary | ICD-10-CM | POA: Diagnosis not present

## 2022-06-26 LAB — COMPREHENSIVE METABOLIC PANEL
ALT: 82 U/L — ABNORMAL HIGH (ref 0–44)
AST: 169 U/L — ABNORMAL HIGH (ref 15–41)
Albumin: 1.9 g/dL — ABNORMAL LOW (ref 3.5–5.0)
Alkaline Phosphatase: 218 U/L — ABNORMAL HIGH (ref 38–126)
Anion gap: 9 (ref 5–15)
BUN: 6 mg/dL (ref 6–20)
CO2: 27 mmol/L (ref 22–32)
Calcium: 8.3 mg/dL — ABNORMAL LOW (ref 8.9–10.3)
Chloride: 99 mmol/L (ref 98–111)
Creatinine, Ser: 0.49 mg/dL (ref 0.44–1.00)
GFR, Estimated: >60 mL/min (ref >60)
Glucose, Bld: 106 mg/dL — ABNORMAL HIGH (ref 70–99)
Potassium: 3.9 mmol/L (ref 3.5–5.1)
Sodium: 135 mmol/L (ref 135–145)
Total Bilirubin: 5.2 mg/dL — ABNORMAL HIGH (ref 0.3–1.2)
Total Protein: 6.8 g/dL (ref 6.5–8.1)

## 2022-06-26 LAB — CBC
HCT: 31.1 % — ABNORMAL LOW (ref 36.0–46.0)
Hemoglobin: 10.5 g/dL — ABNORMAL LOW (ref 12.0–15.0)
MCH: 34.1 pg — ABNORMAL HIGH (ref 26.0–34.0)
MCHC: 33.8 g/dL (ref 30.0–36.0)
MCV: 101 fL — ABNORMAL HIGH (ref 80.0–100.0)
Platelets: 384 10*3/uL (ref 150–400)
RBC: 3.08 MIL/uL — ABNORMAL LOW (ref 3.87–5.11)
RDW: 15.9 % — ABNORMAL HIGH (ref 11.5–15.5)
WBC: 19.5 10*3/uL — ABNORMAL HIGH (ref 4.0–10.5)
nRBC: 0 % (ref 0.0–0.2)

## 2022-06-26 LAB — T4, FREE: Free T4: 1.11 ng/dL (ref 0.61–1.12)

## 2022-06-26 LAB — MAGNESIUM: Magnesium: 1.9 mg/dL (ref 1.7–2.4)

## 2022-06-26 LAB — IMMUNOGLOBULINS A/E/G/M, SERUM
IgA: 705 mg/dL — ABNORMAL HIGH (ref 87–352)
IgE (Immunoglobulin E), Serum: 343 IU/mL (ref 6–495)
IgG (Immunoglobin G), Serum: 1831 mg/dL — ABNORMAL HIGH (ref 586–1602)
IgM (Immunoglobulin M), Srm: 63 mg/dL (ref 26–217)

## 2022-06-26 MED ORDER — FUROSEMIDE 40 MG PO TABS: 40.0000 mg | ORAL_TABLET | Freq: Every day | ORAL | 0 refills | Status: DC

## 2022-06-26 MED ORDER — AMOXICILLIN-POT CLAVULANATE 875-125 MG PO TABS: 1.0000 | ORAL_TABLET | Freq: Two times a day (BID) | ORAL | 0 refills | Status: AC

## 2022-06-26 MED ORDER — DOCUSATE SODIUM 100 MG PO CAPS: 200.0000 mg | ORAL_CAPSULE | Freq: Two times a day (BID) | ORAL | 0 refills | Status: DC | PRN

## 2022-06-26 MED ORDER — OXYCODONE HCL 5 MG PO CAPS: 5.0000 mg | ORAL_CAPSULE | Freq: Four times a day (QID) | ORAL | 0 refills | Status: DC | PRN

## 2022-06-26 NOTE — TOC Transition Note (Signed)
Transition of Care [facility]) - CM/SW Discharge Note   Patient Details  Name: Veronica Calhoun MRN: [id number] Date of Birth: 1985-06-15  Transition of Care [facility]) CM/SW Contact:  Dessa Phi, RN Phone Number: 06/26/2022, 9:29 AM   Clinical Narrative: Referral for PCP-Informed patient has medicaid-an assigned PCP-see d/c instruction, & d/c f/u section for tel# [facility] nurse for assigned PCP-patient voiced understanding. No further CM needs.      Final next level of care: Home/Self Care Barriers to Discharge: No Barriers Identified   Patient Goals and CMS Choice      Discharge Placement                         Discharge Plan and Services Additional resources added to the After Visit Summary for                                       Social Determinants of Health (SDOH) Interventions SDOH Screenings   Food Insecurity: No Food Insecurity (06/24/2022)  Housing: Low Risk  (06/24/2022)  Transportation Needs: No Transportation Needs (06/24/2022)  Utilities: Not At Risk (06/24/2022)  Physical Activity: Inactive (09/02/2017)  Stress: Stress Concern Present (09/02/2017)  Tobacco Use: High Risk (06/24/2022)     Readmission Risk Interventions     No data to display

## 2022-06-26 NOTE — Discharge Summary (Signed)
Physician Discharge Summary  Kurston Minervini Tupper [id number] DOB: Jan 29, 1986 DOA: 06/23/2022  PCP: Pcp, No  Admit date: 06/23/2022 Discharge date: 06/26/2022  Admitted From: Home Disposition: Home  Recommendations for Outpatient Follow-up:  Follow up with PCP in 1-2 weeks Please obtain CMP/CBC in one week your next doctors visit.  Oral Augmentin for 5 more days 7 days of Lasix 40 mg orally given to help with her anasarca, she will need blood work done in at least 1 week with her outpatient provider. Pain medication with bowel regimen prescribed for her acute interstitial pancreatitis. She has been counseled to quit using any alcohol.   Discharge Condition: Stable CODE STATUS: Full code Diet recommendation:   Brief/Interim Summary: 37 year old with history of anxiety, HTN, alcohol abuse, generalized anxiety disorder discharged from [facility] about 2 weeks ago after being treated for alcohol induced pancreatitis.  Hospital course was complicated by hospital-acquired pneumonia and urinary tract infection.  After discharge she had increasing abdominal and lower extremity swelling and reported of orthopnea.  In the ER she was noted to have transaminitis with elevated total bilirubin, leukocytosis.  Chest x-ray was consistent with vascular congestion and pulmonary edema.  Patient was started on IV vancomycin and cefepime.  Repeat CT scan shows persistent evidence of pancreatitis and hepatic steatosis.  Over the course of hospitalization her LFTs improved, her symptoms improved.  She was cleared by GI for discharge with outpatient follow-up.     Assessment & Plan:  Principal Problem:   Sepsis due to pneumonia [facility]) Active Problems:   Anasarca   Alcohol abuse   Elevated LFTs   GERD without esophagitis   Essential hypertension   Hypoalbuminemia       Assessment and Plan: * Sepsis due to pneumonia ([place]) - Sepsis physiology is slowly improving.   Transition to oral Augmentin for 5 more  days to complete total 7-day course.  Breathings stable. -As needed bronchodilators, I-S and flutter valve. Procal 0.8, elevated BNP   Anasarca and pulmonary edema Transaminitis, hepatic steatosis - Unclear etiology.  LFTs improved.  Volume status significantly improved.  She will be transition to daily Lasix for 7 more days.  Repeat blood work with PCP in 1 week. -Elevated LFTs can be from liver congestion but also has alcohol abuse history.  Acute hepatitis panel negative.  [facility] following.  -Recent autoimmune workup-negative   Alcohol abuse Persistent interstitial pancreatitis - Reports she has not been drinking over past 2 weeks.  Continue multivitamins, folate and thiamine.  Lipase is normal.  Not showing any signs of alcohol withdrawal.  Counseled to quit drinking   GERD without esophagitis - Protonix daily   Essential hypertension - Clonidine, Lopressor.   Macrocytosis - Likely from alcohol use.  B12 and folate are normal.  TSH about 2 weeks ago was 4.5.      Discharge Diagnoses:  Principal Problem:   Sepsis due to pneumonia [facility]) Active Problems:   Anasarca   Alcohol abuse   Elevated LFTs   GERD without esophagitis   Essential hypertension   Hypoalbuminemia      Consultations: [facility]  Subjective: Feels great no complaints.  Wishes to go home  Discharge Exam: Vitals:   06/25/22 1929 06/26/22 0410  BP: (!) 152/81 (!) 138/94  Pulse: 99 85  Resp: 20 20  Temp: 99.2 F (37.3 C) 98.2 F (36.8 C)  SpO2: 99% 98%   Vitals:   06/25/22 0432 06/25/22 1205 06/25/22 1929 06/26/22 0410  BP: (!) 151/86 131/82 Marland Kitchen)  152/81 (!) 138/94  Pulse: 90 84 99 85  Resp: 20 17 20 20  $ Temp: 98.5 F (36.9 C) 98.6 F (37 C) 99.2 F (37.3 C) 98.2 F (36.8 C)  TempSrc: Oral Oral Oral Oral  SpO2: 100% 96% 99% 98%  Weight:      Height:        General: Pt is alert, awake, not in acute distress Cardiovascular: RRR, S1/S2 +, no rubs, no  gallops Respiratory: CTA bilaterally, no wheezing, no rhonchi Abdominal: Soft, NT, ND, bowel sounds + Extremities: no edema, no cyanosis Trace bilateral lower extremity swelling, significantly improved since the time of admission  Discharge Instructions   Allergies as of 06/26/2022       Reactions   Lisinopril Swelling   Reports Lip swelling   Amlodipine Other (See Comments)   "like I was going to pass out"        Medication List     STOP taking these medications    ibuprofen 200 MG tablet Commonly known as: ADVIL       TAKE these medications    albuterol 108 (90 Base) MCG/ACT inhaler Commonly known as: VENTOLIN HFA Inhale 2 puffs into the lungs every 6 (six) hours as needed for wheezing or shortness of breath.   amoxicillin-clavulanate 875-125 MG tablet Commonly known as: AUGMENTIN Take 1 tablet by mouth 2 (two) times daily for 5 days.   cloNIDine 0.2 MG tablet Commonly known as: CATAPRES Take 1 tablet (0.2 mg total) by mouth 2 (two) times daily.   docusate sodium 100 MG capsule Commonly known as: Colace Take 2 capsules (200 mg total) by mouth 2 (two) times daily as needed for mild constipation or moderate constipation.   folic acid 1 MG tablet Commonly known as: FOLVITE Take 1 tablet (1 mg total) by mouth daily.   furosemide 40 MG tablet Commonly known as: Lasix Take 1 tablet (40 mg total) by mouth daily for 7 days.   guaiFENesin-dextromethorphan 100-10 MG/5ML syrup Commonly known as: ROBITUSSIN DM Take 15 mLs by mouth every 4 (four) hours as needed for cough.   metoprolol tartrate 50 MG tablet Commonly known as: LOPRESSOR Take 1 tablet (50 mg total) by mouth 2 (two) times daily.   oxycodone 5 MG capsule Commonly known as: OXY-IR Take 1 capsule (5 mg total) by mouth every 6 (six) hours as needed.   pantoprazole 40 MG tablet Commonly known as: Protonix Take 1 tablet (40 mg total) by mouth daily.   polyethylene glycol 17 g packet Commonly known  as: MiraLax Take 17 g by mouth daily as needed for mild constipation, moderate constipation or severe constipation.   thiamine 100 MG tablet Commonly known as: Vitamin B-1 Take 1 tablet (100 mg total) by mouth daily.        Allergies  Allergen Reactions   Lisinopril Swelling    Reports Lip swelling   Amlodipine Other (See Comments)    "like I was going to pass out"    You were cared for by a hospitalist during your hospital stay. If you have any questions about your discharge medications or the care you received while you were in the hospital after you are discharged, you can call the unit and asked to speak with the hospitalist on call if the hospitalist that took care of you is not available. Once you are discharged, your primary care physician will handle any further medical issues. Please note that no refills for any discharge medications will be authorized once  you are discharged, as it is imperative that you return to your primary care physician (or establish a relationship with a primary care physician if you do not have one) for your aftercare needs so that they can reassess your need for medications and monitor your lab values.   Procedures/Studies: CT ABDOMEN PELVIS W CONTRAST  Result Date: 06/24/2022 CLINICAL DATA:  Persistent pancreatitis. EXAM: CT ABDOMEN AND PELVIS WITH CONTRAST TECHNIQUE: Multidetector CT imaging of the abdomen and pelvis was performed using the standard protocol following bolus administration of intravenous contrast. RADIATION DOSE REDUCTION: This exam was performed according to the departmental dose-optimization program which includes automated exposure control, adjustment of the mA and/or kV according to patient size and/or use of iterative reconstruction technique. CONTRAST:  12m OMNIPAQUE IOHEXOL 300 MG/ML  SOLN COMPARISON:  Multiple priors including CT June 15, 2022. FINDINGS: Lower chest: Trace bilateral pleural effusions with adjacent airspace  opacities. Mild bibasilar interstitial thickening. Hepatobiliary: Hepatomegaly with diffuse hepatic steatosis. Gallbladder is unremarkable. No biliary ductal dilation. Pancreas: Similar edematous appearance of the pancreas without discrete necrosis. Peripancreatic fluid extends along the retroperitoneum into the pelvis with increased now small-moderate moderate volume of pelvic fluid. Fluid additionally extends along mesenteric root. No walled off fluid collection. No pancreatic ductal dilation. Spleen: No splenomegaly. Adrenals/Urinary Tract: Bilateral adrenal glands appear normal. No hydronephrosis. Kidneys demonstrate symmetric enhancement. Urinary bladder is unremarkable for degree of distension Stomach/Bowel: Reactive inflammation of the stomach and duodenal similar prior. No pathologic dilation of small or large bowel. Vascular/Lymphatic: Normal caliber abdominal aorta. Smooth IVC contours. No pathologically enlarged abdominal or pelvic lymph nodes Reproductive: Uterus and bilateral adnexa are unremarkable. Other: Similar anasarca with fluid pooling in the bilateral flanks Musculoskeletal: No acute osseous abnormality. IMPRESSION: 1. Persistent interstitial pancreatitis without evidence of necrosis. No walled-off fluid collection or pancreatic ductal dilation. Increased volume pelvic free fluid which extends retroperitoneal from the peripancreatic fluid. 2. Trace bilateral pleural effusions with adjacent airspace opacities, favored to reflect atelectasis. 3. Hepatomegaly with diffuse hepatic steatosis. Electronically Signed   By: JDahlia BailiffM.D.   On: 06/24/2022 12:24   DG Chest Portable 1 View  Result Date: 06/23/2022 CLINICAL DATA:  Shortness of breath EXAM: PORTABLE CHEST 1 VIEW COMPARISON:  Chest CT 06/15/2022 FINDINGS: Mild cardiomegaly with vascular congestion and pulmonary edema. Suspected left pleural effusion. Airspace disease at the left base. IMPRESSION: Cardiomegaly with vascular congestion  and pulmonary edema. Suspected left pleural effusion. Airspace disease at the left base may be due to atelectasis or pneumonia. Electronically Signed   By: KDonavan FoilM.D.   On: 06/23/2022 22:49   DG Abd 1 View  Result Date: 06/17/2022 CLINICAL DATA:  Abdominal distention EXAM: ABDOMEN - 1 VIEW COMPARISON:  None Available. FINDINGS: Gaseous distention of the colon. Scattered nondilated gas-filled loops of small bowel. No radio-opaque calculi or other significant radiographic abnormality are seen. IMPRESSION: Gaseous distention of the colon, likely due to ileus. Electronically Signed   By: LYetta GlassmanM.D.   On: 06/17/2022 13:27   CT Angio Chest Pulmonary Embolism (PE) W or WO Contrast  Result Date: 06/15/2022 CLINICAL DATA:  Tachycardia. Elevated D-dimer. Alcohol-induced acute pancreatitis. EXAM: CT ANGIOGRAPHY CHEST CT ABDOMEN AND PELVIS WITH CONTRAST TECHNIQUE: Multidetector CT imaging of the chest was performed using the standard protocol during bolus administration of intravenous contrast. Multiplanar CT image reconstructions and MIPs were obtained to evaluate the vascular anatomy. Multidetector CT imaging of the abdomen and pelvis was performed using the standard protocol during bolus administration  of intravenous contrast. RADIATION DOSE REDUCTION: This exam was performed according to the departmental dose-optimization program which includes automated exposure control, adjustment of the mA and/or kV according to patient size and/or use of iterative reconstruction technique. CONTRAST:  163m OMNIPAQUE IOHEXOL 350 MG/ML SOLN COMPARISON:  AP CT on 06/11/2022 FINDINGS: CTA CHEST FINDINGS Cardiovascular: Satisfactory opacification of pulmonary arteries noted, and no pulmonary emboli identified. No evidence of thoracic aortic dissection or aneurysm. Mediastinum/Nodes: No masses or pathologically enlarged lymph nodes identified. Lungs/Pleura: Airspace disease is seen in both upper lobes and right  middle lobe, suspicious for pneumonia. Left lower lobe atelectasis and tiny left pleural effusion noted. Musculoskeletal: No suspicious bone lesions identified. Review of the MIP images confirms the above findings. CT ABDOMEN and PELVIS FINDINGS Hepatobiliary: No hepatic masses identified. Moderate diffuse hepatic steatosis again noted. Gallbladder is unremarkable. No evidence of biliary ductal dilatation. Pancreas: Moderate to severe acute pancreatitis shows no significant change. No evidence of pancreatic necrosis or mass. Evidence of pancreatic ductal dilatation. Moderate peripancreatic fluid is seen which tracts inferiorly into the pelvis. This shows mild decrease since previous study. No pseudocysts identified. Spleen: Within normal limits in size and appearance. Adrenals/Urinary Tract: No suspicious masses identified. No evidence of ureteral calculi or hydronephrosis. Stomach/Bowel: Reactive wall thickening is again seen involving the stomach secondary to acute pancreatitis. Increased diffuse colonic dilatation is seen, consistent with ileus. Vascular/Lymphatic: No pathologically enlarged lymph nodes. No acute vascular findings. Reproductive:  No mass or other significant abnormality. Other:  Increased diffuse body wall edema. Musculoskeletal:  No suspicious bone lesions identified. Review of the MIP images confirms the above findings. IMPRESSION: No evidence of pulmonary embolism. Airspace disease in both upper lobes and right middle lobe, suspicious for pneumonia. Left lower lobe atelectasis and tiny left pleural effusion. Moderate to severe acute pancreatitis, with mild decrease in peripancreatic fluid and ascites. No evidence of pancreatic necrosis or pseudocysts. Worsening colonic ileus. Increased diffuse body wall edema. Stable hepatic steatosis. Electronically Signed   By: JMarlaine HindM.D.   On: 06/15/2022 13:19   CT ABDOMEN PELVIS W CONTRAST  Result Date: 06/15/2022 CLINICAL DATA:  Tachycardia.  Elevated D-dimer. Alcohol-induced acute pancreatitis. EXAM: CT ANGIOGRAPHY CHEST CT ABDOMEN AND PELVIS WITH CONTRAST TECHNIQUE: Multidetector CT imaging of the chest was performed using the standard protocol during bolus administration of intravenous contrast. Multiplanar CT image reconstructions and MIPs were obtained to evaluate the vascular anatomy. Multidetector CT imaging of the abdomen and pelvis was performed using the standard protocol during bolus administration of intravenous contrast. RADIATION DOSE REDUCTION: This exam was performed according to the departmental dose-optimization program which includes automated exposure control, adjustment of the mA and/or kV according to patient size and/or use of iterative reconstruction technique. CONTRAST:  1057mOMNIPAQUE IOHEXOL 350 MG/ML SOLN COMPARISON:  AP CT on 06/11/2022 FINDINGS: CTA CHEST FINDINGS Cardiovascular: Satisfactory opacification of pulmonary arteries noted, and no pulmonary emboli identified. No evidence of thoracic aortic dissection or aneurysm. Mediastinum/Nodes: No masses or pathologically enlarged lymph nodes identified. Lungs/Pleura: Airspace disease is seen in both upper lobes and right middle lobe, suspicious for pneumonia. Left lower lobe atelectasis and tiny left pleural effusion noted. Musculoskeletal: No suspicious bone lesions identified. Review of the MIP images confirms the above findings. CT ABDOMEN and PELVIS FINDINGS Hepatobiliary: No hepatic masses identified. Moderate diffuse hepatic steatosis again noted. Gallbladder is unremarkable. No evidence of biliary ductal dilatation. Pancreas: Moderate to severe acute pancreatitis shows no significant change. No evidence of pancreatic necrosis or mass. Evidence  of pancreatic ductal dilatation. Moderate peripancreatic fluid is seen which tracts inferiorly into the pelvis. This shows mild decrease since previous study. No pseudocysts identified. Spleen: Within normal limits in size and  appearance. Adrenals/Urinary Tract: No suspicious masses identified. No evidence of ureteral calculi or hydronephrosis. Stomach/Bowel: Reactive wall thickening is again seen involving the stomach secondary to acute pancreatitis. Increased diffuse colonic dilatation is seen, consistent with ileus. Vascular/Lymphatic: No pathologically enlarged lymph nodes. No acute vascular findings. Reproductive:  No mass or other significant abnormality. Other:  Increased diffuse body wall edema. Musculoskeletal:  No suspicious bone lesions identified. Review of the MIP images confirms the above findings. IMPRESSION: No evidence of pulmonary embolism. Airspace disease in both upper lobes and right middle lobe, suspicious for pneumonia. Left lower lobe atelectasis and tiny left pleural effusion. Moderate to severe acute pancreatitis, with mild decrease in peripancreatic fluid and ascites. No evidence of pancreatic necrosis or pseudocysts. Worsening colonic ileus. Increased diffuse body wall edema. Stable hepatic steatosis. Electronically Signed   By: Marlaine Hind M.D.   On: 06/15/2022 13:19   CT ABDOMEN PELVIS W CONTRAST  Result Date: 06/11/2022 CLINICAL DATA:  Pancreatitis, abdominal pain, alcohol abuse EXAM: CT ABDOMEN AND PELVIS WITH CONTRAST TECHNIQUE: Multidetector CT imaging of the abdomen and pelvis was performed using the standard protocol following bolus administration of intravenous contrast. RADIATION DOSE REDUCTION: This exam was performed according to the departmental dose-optimization program which includes automated exposure control, adjustment of the mA and/or kV according to patient size and/or use of iterative reconstruction technique. CONTRAST:  114m OMNIPAQUE IOHEXOL 300 MG/ML  SOLN COMPARISON:  06/10/2022, 12/28/2021 FINDINGS: Lower chest: Hypoventilatory changes at the lung bases. Trace left pleural effusion. Hepatobiliary: Hepatic steatosis. Stable hepatomegaly. No biliary duct dilation. The gallbladder is  unremarkable. Pancreas: Heterogeneous decreased enhancement within the head and uncinate process of the pancreas consistent with interstitial edema and underlying pancreatitis. There is marked peripancreatic fat stranding and free fluid. No organized fluid collection, abscess, or pseudocyst at this time. No pancreatic duct dilation. Spleen: Normal in size without focal abnormality. Adrenals/Urinary Tract: Punctate less than 2 mm nonobstructing left renal calculus. No obstructive uropathy within either kidney. The adrenals are grossly normal. The bladder is moderately distended, with intramural gas seen throughout the dependent portion of the gallbladder concerning for emphysematous cystitis. A small amount of intraluminal gas within the bladder may reflect recent catheterization. Please correlate with urinalysis. Stomach/Bowel: No bowel obstruction or ileus. There is secondary wall thickening of the duodenum adjacent to the acute pancreatitis described above. No other wall thickening. Vascular/Lymphatic: No significant vascular findings are present. No enlarged abdominal or pelvic lymph nodes. Reproductive: Uterus and bilateral adnexa are unremarkable. Other: Small volume ascites throughout the lower abdomen and pelvis. No free intraperitoneal gas. No abdominal wall hernia. Musculoskeletal: No acute or destructive bony lesions. Reconstructed images demonstrate no additional findings. IMPRESSION: 1. Severe acute pancreatitis, with edematous changes throughout the head and uncinate process of the pancreas. No evidence of necrosis, fluid collection, abscess, or pseudocyst at this time. 2. Intramural gas within the posterior bladder wall, compatible with emphysematous cystitis. Please correlate with urinalysis. 3. Lower abdominal and pelvic ascites. 4. Trace left pleural effusion. 5. Stable hepatic steatosis. 6. Punctate less than 2 mm nonobstructing left renal calculus. These results will be called to the ordering  clinician or representative by the Radiologist Assistant, and communication documented in the PACS or [employer] Electronically Signed   By: MRanda NgoM.D.   On: 06/11/2022 20:29  US Abdomen Complete  Result Date: 06/10/2022 CLINICAL DATA:  Epigastric abdominal pain since this morning. EXAM: ABDOMEN ULTRASOUND COMPLETE COMPARISON:  CT scan 12/28/2021 FINDINGS: Gallbladder: No gallstones or wall thickening visualized. No sonographic Murphy sign noted by sonographer. Common bile duct: Diameter: 5.0 mm Liver: There is diffuse increased echogenicity of the liver and decreased through transmission consistent with fatty infiltration. No focal lesions or biliary dilatation. Portal vein is patent on color Doppler imaging with normal direction of blood flow towards the liver. IVC: Normal caliber Pancreas: Visualized portion unremarkable. Spleen: Not well visualize. Right Kidney: Length: 13.4 cm. Normal renal cortical thickness and echogenicity without focal lesions or hydronephrosis. Left Kidney: Length: 11.4 cm. Normal renal cortical thickness and echogenicity without focal lesions or hydronephrosis. Abdominal aorta: Poorly visualized. Other findings: No ascites IMPRESSION: 1. Diffuse fatty infiltration liver but no hepatic lesions or intrahepatic biliary dilatation. 2. Normal gallbladder. 3. Poorly visualized pancreas, spleen and aorta. 4. Normal kidneys.  No ascites. Electronically Signed   By: Marijo Sanes M.D.   On: 06/10/2022 16:46     The results of significant diagnostics from this hospitalization (including imaging, microbiology, ancillary and laboratory) are listed below for reference.     Microbiology: Recent Results (from the past 240 hour(s))  Blood culture (routine x 2)     Status: None (Preliminary result)   Collection Time: 06/23/22 11:20 PM   Specimen: BLOOD  Result Value Ref Range Status   Specimen Description   Final    BLOOD BLOOD LEFT ARM Performed at [facility] [address] [place], [place] [address]    Special Requests   Final    BOTTLES DRAWN AEROBIC AND ANAEROBIC Blood Culture adequate volume Performed at [facility] [address] [place], [place] [address]    Culture   Final    NO GROWTH 2 DAYS Performed at [facility] [address] [place], [place] [address]    Report Status PENDING  Incomplete  Blood culture (routine x 2)     Status: None (Preliminary result)   Collection Time: 06/23/22 11:25 PM   Specimen: BLOOD  Result Value Ref Range Status   Specimen Description   Final    BLOOD BLOOD RIGHT FOREARM Performed at [facility] [address], [place], [place] [address]    Special Requests   Final    BOTTLES DRAWN AEROBIC AND ANAEROBIC Blood Culture adequate volume Performed at [facility] [address] [place], [place] [address]    Culture   Final    NO GROWTH 2 DAYS Performed at [facility] [address], [place], [place] [address]    Report Status PENDING  Incomplete  MRSA Next Gen by PCR, Nasal     Status: None   Collection Time: 06/24/22  9:07 AM   Specimen: Nasal Mucosa; Nasal Swab  Result Value Ref Range Status   MRSA by PCR Next Gen NOT DETECTED NOT DETECTED Final    Comment: (NOTE) The GeneXpert MRSA Assay (FDA approved for NASAL specimens only), is one component of a comprehensive MRSA colonization surveillance program. It is not intended to diagnose MRSA infection nor to guide or monitor treatment for MRSA infections. Test performance is not FDA approved in patients less than 51 years old. Performed at [facility], [place] [address] [place], [place] [address]   Resp panel by RT-PCR (RSV, Flu A&B, Covid) Nasal Mucosa     Status:  None   Collection Time: 06/24/22  9:07 AM   Specimen: Nasal Mucosa; Nasal Swab  Result Value Ref Range Status   SARS Coronavirus 2 by RT PCR NEGATIVE NEGATIVE Final     Comment: (NOTE) SARS-CoV-2 target nucleic acids are NOT DETECTED.  The SARS-CoV-2 RNA is generally detectable in upper respiratory specimens during the acute phase of infection. The lowest concentration of SARS-CoV-2 viral copies this assay can detect is 138 copies/mL. A negative result does not preclude SARS-Cov-2 infection and should not be used as the sole basis for treatment or other patient management decisions. A negative result may occur with  improper specimen collection/handling, submission of specimen other than nasopharyngeal swab, presence of viral mutation(s) within the areas targeted by this assay, and inadequate number of viral copies(<138 copies/mL). A negative result must be combined with clinical observations, patient history, and epidemiological information. The expected result is Negative.  Fact Sheet for Patients:  EntrepreneurPulse.com.au  Fact Sheet for Healthcare Providers:  IncredibleEmployment.be  This test is no t yet approved or cleared by the [place] FDA and  has been authorized for detection and/or diagnosis of SARS-CoV-2 by FDA under an Emergency Use Authorization (EUA). This EUA will remain  in effect (meaning this test can be used) for the duration of the COVID-19 declaration under Section 564(b)(1) of the Act, 21 U.S.C.section 360bbb-3(b)(1), unless the authorization is terminated  or revoked sooner.       Influenza A by PCR NEGATIVE NEGATIVE Final   Influenza B by PCR NEGATIVE NEGATIVE Final    Comment: (NOTE) The Xpert Xpress SARS-CoV-2/FLU/RSV plus assay is intended as an aid in the diagnosis of influenza from Nasopharyngeal swab specimens and should not be used as a sole basis for treatment. Nasal washings and aspirates are unacceptable for Xpert Xpress SARS-CoV-2/FLU/RSV testing.  Fact Sheet for Patients: EntrepreneurPulse.com.au  Fact Sheet for Healthcare  Providers: IncredibleEmployment.be  This test is not yet approved or cleared by the [place] FDA and has been authorized for detection and/or diagnosis of SARS-CoV-2 by FDA under an Emergency Use Authorization (EUA). This EUA will remain in effect (meaning this test can be used) for the duration of the COVID-19 declaration under Section 564(b)(1) of the Act, 21 U.S.C. section 360bbb-3(b)(1), unless the authorization is terminated or revoked.     Resp Syncytial Virus by PCR NEGATIVE NEGATIVE Final    Comment: (NOTE) Fact Sheet for Patients: EntrepreneurPulse.com.au  Fact Sheet for Healthcare Providers: IncredibleEmployment.be  This test is not yet approved or cleared by the [place] FDA and has been authorized for detection and/or diagnosis of SARS-CoV-2 by FDA under an Emergency Use Authorization (EUA). This EUA will remain in effect (meaning this test can be used) for the duration of the COVID-19 declaration under Section 564(b)(1) of the Act, 21 U.S.C. section 360bbb-3(b)(1), unless the authorization is terminated or revoked.  Performed at [facility], [place] [address], [place], [place] [address]      Labs: BNP (last 3 results) Recent Labs    06/24/22 0612 06/24/22 1323  BNP 477.3* [id number]*   Basic Metabolic Panel: Recent Labs  Lab 06/19/22 0753 06/20/22 0428 06/21/22 0552 06/22/22 0539 06/23/22 0527 06/23/22 2220 06/24/22 0614 06/25/22 0212 06/26/22 0526  NA 134*   < > 136   < > 135 134* 135 134* 135  K 3.7   < > 4.0   < > 3.5 3.8 3.6 3.4* 3.9  CL 109   < > 109   < >  105 105 103 100 99  CO2 17*   < > 18*   < > 24 20* 21* 23 27  GLUCOSE 107*   < > 106*   < > 103* 96 96 95 106*  BUN 13   < > 10   < > 6 5* <5* 6 6  CREATININE 0.49   < > 0.40*   < > 0.46 0.44 0.43* 0.53 0.49  CALCIUM 7.7*   < > 7.9*   < > 8.2* 8.3* 8.4* 8.0* 8.3*  MG 2.0  --  1.9  --   --   --   --  1.7 1.9   <  > = values in this interval not displayed.   Liver Function Tests: Recent Labs  Lab 06/22/22 0539 06/23/22 0527 06/23/22 2220 06/25/22 0212 06/26/22 0526  AST 153* 157* 158* 186* 169*  ALT 76* 84* 79* 88* 82*  ALKPHOS 157* 205* 219* 233* 218*  BILITOT 7.8* 7.2* 6.3* 5.4* 5.2*  PROT 6.2* 7.0 6.3* 6.3* 6.8  ALBUMIN 1.8* 1.9* 1.9* 2.1* 1.9*   Recent Labs  Lab 06/23/22 2220  LIPASE 46   No results for input(s): "AMMONIA" in the last 168 hours. CBC: Recent Labs  Lab 06/20/22 0428 06/21/22 0659 06/23/22 0527 06/23/22 2220 06/24/22 0614 06/25/22 0212 06/26/22 0526  WBC 27.5*   < > 22.7* 24.2* 21.6* 19.6* 19.5*  NEUTROABS 19.8*  --   --  18.6*  --   --   --   HGB 11.1*   < > 11.6* 10.6* 11.2* 10.3* 10.5*  HCT 31.8*   < > 34.8* 31.8* 34.2* 31.1* 31.1*  MCV 100.3*   < > 102.4* 101.9* 103.3* 104.0* 101.0*  PLT 270   < > 370 385 344 369 384   < > = values in this interval not displayed.   Cardiac Enzymes: No results for input(s): "CKTOTAL", "CKMB", "CKMBINDEX", "TROPONINI" in the last 168 hours. BNP: Invalid input(s): "POCBNP" CBG: No results for input(s): "GLUCAP" in the last 168 hours. D-Dimer No results for input(s): "DDIMER" in the last 72 hours. Hgb A1c No results for input(s): "HGBA1C" in the last 72 hours. Lipid Profile No results for input(s): "CHOL", "HDL", "LDLCALC", "TRIG", "CHOLHDL", "LDLDIRECT" in the last 72 hours. Thyroid function studies No results for input(s): "TSH", "T4TOTAL", "T3FREE", "THYROIDAB" in the last 72 hours.  Invalid input(s): "FREET3" Anemia work up Recent Labs    06/24/22 1323  VITAMINB12 937*  FOLATE 10.4   Urinalysis    Component Value Date/Time   COLORURINE AMBER (A) 06/11/2022 0120   APPEARANCEUR CLOUDY (A) 06/11/2022 0120   LABSPEC 1.023 06/11/2022 0120   PHURINE 5.0 06/11/2022 0120   GLUCOSEU 50 (A) 06/11/2022 0120   HGBUR LARGE (A) 06/11/2022 0120   BILIRUBINUR NEGATIVE 06/11/2022 0120   BILIRUBINUR neg 09/02/2017  1457   KETONESUR NEGATIVE 06/11/2022 0120   PROTEINUR >=300 (A) 06/11/2022 0120   UROBILINOGEN 0.2 10/16/2019 1500   NITRITE NEGATIVE 06/11/2022 0120   LEUKOCYTESUR NEGATIVE 06/11/2022 0120   Sepsis Labs Recent Labs  Lab 06/23/22 2220 06/24/22 0614 06/25/22 0212 06/26/22 0526  WBC 24.2* 21.6* 19.6* 19.5*   Microbiology Recent Results (from the past 240 hour(s))  Blood culture (routine x 2)     Status: None (Preliminary result)   Collection Time: 06/23/22 11:20 PM   Specimen: BLOOD  Result Value Ref Range Status   Specimen Description   Final    BLOOD BLOOD LEFT ARM Performed at [facility],  [place] [address], [place], [place] [address]    Special Requests   Final    BOTTLES DRAWN AEROBIC AND ANAEROBIC Blood Culture adequate volume Performed at [place] [address] [place], [place] [address]    Culture   Final    NO GROWTH 2 DAYS Performed at [place] [address] [place], [place] [address]    Report Status PENDING  Incomplete  Blood culture (routine x 2)     Status: None (Preliminary result)   Collection Time: 06/23/22 11:25 PM   Specimen: BLOOD  Result Value Ref Range Status   Specimen Description   Final    BLOOD BLOOD RIGHT FOREARM Performed at [place] [address] [place], [place] [address]    Special Requests   Final    BOTTLES DRAWN AEROBIC AND ANAEROBIC Blood Culture adequate volume Performed at [place] [address], [place], [place] [address]    Culture   Final    NO GROWTH 2 DAYS Performed at [place] [address], [place], [place] [address]    Report Status PENDING  Incomplete  MRSA Next Gen by PCR, Nasal     Status: None   Collection Time: 06/24/22  9:07 AM   Specimen: Nasal Mucosa; Nasal Swab  Result Value Ref Range Status   MRSA by PCR Next Gen NOT DETECTED NOT DETECTED Final    Comment: (NOTE) The GeneXpert MRSA Assay  (FDA approved for NASAL specimens only), is one component of a comprehensive MRSA colonization surveillance program. It is not intended to diagnose MRSA infection nor to guide or monitor treatment for MRSA infections. Test performance is not FDA approved in patients less than 83 years old. Performed at [facility], [place] [address], [place], [place] [address]   Resp panel by RT-PCR (RSV, Flu A&B, Covid) Nasal Mucosa     Status: None   Collection Time: 06/24/22  9:07 AM   Specimen: Nasal Mucosa; Nasal Swab  Result Value Ref Range Status   SARS Coronavirus 2 by RT PCR NEGATIVE NEGATIVE Final    Comment: (NOTE) SARS-CoV-2 target nucleic acids are NOT DETECTED.  The SARS-CoV-2 RNA is generally detectable in upper respiratory specimens during the acute phase of infection. The lowest concentration of SARS-CoV-2 viral copies this assay can detect is 138 copies/mL. A negative result does not preclude SARS-Cov-2 infection and should not be used as the sole basis for treatment or other patient management decisions. A negative result may occur with  improper specimen collection/handling, submission of specimen other than nasopharyngeal swab, presence of viral mutation(s) within the areas targeted by this assay, and inadequate number of viral copies(<138 copies/mL). A negative result must be combined with clinical observations, patient history, and epidemiological information. The expected result is Negative.  Fact Sheet for Patients:  EntrepreneurPulse.com.au  Fact Sheet for Healthcare Providers:  IncredibleEmployment.be  This test is no t yet approved or cleared by the [place] FDA and  has been authorized for detection and/or diagnosis of SARS-CoV-2 by FDA under an Emergency Use Authorization (EUA). This EUA will remain  in effect (meaning this test can be used) for the duration of the COVID-19 declaration under Section 564(b)(1)  of the Act, 21 U.S.C.section 360bbb-3(b)(1), unless the authorization is terminated  or revoked sooner.       Influenza A by PCR NEGATIVE NEGATIVE Final   Influenza B by PCR NEGATIVE NEGATIVE Final  Comment: (NOTE) The Xpert Xpress SARS-CoV-2/FLU/RSV plus assay is intended as an aid in the diagnosis of influenza from Nasopharyngeal swab specimens and should not be used as a sole basis for treatment. Nasal washings and aspirates are unacceptable for Xpert Xpress SARS-CoV-2/FLU/RSV testing.  Fact Sheet for Patients: EntrepreneurPulse.com.au  Fact Sheet for Healthcare Providers: IncredibleEmployment.be  This test is not yet approved or cleared by the [place] FDA and has been authorized for detection and/or diagnosis of SARS-CoV-2 by FDA under an Emergency Use Authorization (EUA). This EUA will remain in effect (meaning this test can be used) for the duration of the COVID-19 declaration under Section 564(b)(1) of the Act, 21 U.S.C. section 360bbb-3(b)(1), unless the authorization is terminated or revoked.     Resp Syncytial Virus by PCR NEGATIVE NEGATIVE Final    Comment: (NOTE) Fact Sheet for Patients: EntrepreneurPulse.com.au  Fact Sheet for Healthcare Providers: IncredibleEmployment.be  This test is not yet approved or cleared by the [place] FDA and has been authorized for detection and/or diagnosis of SARS-CoV-2 by FDA under an Emergency Use Authorization (EUA). This EUA will remain in effect (meaning this test can be used) for the duration of the COVID-19 declaration under Section 564(b)(1) of the Act, 21 U.S.C. section 360bbb-3(b)(1), unless the authorization is terminated or revoked.  Performed at [facility], [place] [address] [place], [place] [address]      Time coordinating discharge:  I have spent 35 minutes face to face with the patient and on the ward  discussing the patients care, assessment, plan and disposition with other care givers. >50% of the time was devoted counseling the patient about the risks and benefits of treatment/Discharge disposition and coordinating care.   SIGNED:   Damita Lack, MD  [facility] 06/26/2022, 7:31 AM   If 7PM-7AM, please contact night-coverage

## 2022-06-29 LAB — CULTURE, BLOOD (ROUTINE X 2)
Culture: NO GROWTH
Culture: NO GROWTH
Special Requests: ADEQUATE
Special Requests: ADEQUATE

## 2022-06-30 ENCOUNTER — Other Ambulatory Visit (HOSPITAL_COMMUNITY)
Admission: RE | Admit: 2022-06-30 | Discharge: 2022-06-30 | Disposition: A | Discharge status: Home / Self Care | Payer: Medicaid Other | Source: Ambulatory Visit | Attending: Gastroenterology | Admitting: Gastroenterology

## 2022-06-30 DIAGNOSIS — R7989 Other specified abnormal findings of blood chemistry: Secondary | ICD-10-CM | POA: Insufficient documentation

## 2022-06-30 DIAGNOSIS — K858 Other acute pancreatitis without necrosis or infection: Secondary | ICD-10-CM | POA: Insufficient documentation

## 2022-06-30 LAB — HEPATIC FUNCTION PANEL
ALT: 79 U/L — ABNORMAL HIGH (ref 0–44)
AST: 219 U/L — ABNORMAL HIGH (ref 15–41)
Albumin: 2.4 g/dL — ABNORMAL LOW (ref 3.5–5.0)
Alkaline Phosphatase: 200 U/L — ABNORMAL HIGH (ref 38–126)
Bilirubin, Direct: 1.9 mg/dL — ABNORMAL HIGH (ref 0.0–0.2)
Indirect Bilirubin: 2.4 mg/dL — ABNORMAL HIGH (ref 0.3–0.9)
Total Bilirubin: 4.3 mg/dL — ABNORMAL HIGH (ref 0.3–1.2)
Total Protein: 7.8 g/dL (ref 6.5–8.1)

## 2022-06-30 LAB — PROTIME-INR
INR: 1.2 (ref 0.8–1.2)
Prothrombin Time: 15.5 seconds — ABNORMAL HIGH (ref 11.4–15.2)

## 2022-07-01 LAB — HEMOCHROMATOSIS DNA-PCR(C282Y,H63D)

## 2022-07-02 ENCOUNTER — Ambulatory Visit (INDEPENDENT_AMBULATORY_CARE_PROVIDER_SITE_OTHER): Payer: Medicaid Other | Admitting: Gastroenterology

## 2022-07-02 ENCOUNTER — Encounter: Payer: Self-pay | Admitting: Gastroenterology

## 2022-07-02 ENCOUNTER — Other Ambulatory Visit: Payer: Self-pay

## 2022-07-02 ENCOUNTER — Telehealth: Payer: Self-pay | Admitting: Gastroenterology

## 2022-07-02 VITALS — BP 166/121 | HR 93 | Temp 97.3°F | Ht 66.0 in | Wt 263.5 lb

## 2022-07-02 DIAGNOSIS — K858 Other acute pancreatitis without necrosis or infection: Secondary | ICD-10-CM | POA: Diagnosis not present

## 2022-07-02 DIAGNOSIS — Z8719 Personal history of other diseases of the digestive system: Secondary | ICD-10-CM

## 2022-07-02 DIAGNOSIS — R7989 Other specified abnormal findings of blood chemistry: Secondary | ICD-10-CM

## 2022-07-02 DIAGNOSIS — R109 Unspecified abdominal pain: Secondary | ICD-10-CM

## 2022-07-02 MED ORDER — FUROSEMIDE 40 MG PO TABS: 40.0000 mg | ORAL_TABLET | Freq: Every day | ORAL | 0 refills | Status: DC

## 2022-07-02 NOTE — Patient Instructions (Addendum)
Continue Lasix once daily for another week. We may need to continue it a little longer, so please keep in touch with how you are doing.  I am checking your electrolytes today, as we may need to have you take a small amount of potassium with this till you are done.  Let me know how much pain medication you have left!  Follow a strict low-fat diet to help stressing out your pancreas.   We will see you in 6 weeks regardless!  It was a pleasure to see you today. I want to create trusting relationships with patients and provide genuine, compassionate, and quality care. I truly value your feedback, so please be on the lookout for a survey regarding your visit with me today. I appreciate your time in completing this!    Annitta Needs, PhD, ANP-BC [facility] Gastroenterology

## 2022-07-02 NOTE — Telephone Encounter (Signed)
Noted  

## 2022-07-02 NOTE — Telephone Encounter (Signed)
Labs put in for [facility]

## 2022-07-02 NOTE — Telephone Encounter (Signed)
Dena:  Can we have an HFP and INR put on file at [facility] for patient to have done next week? She can do next Monday or Tuesday.

## 2022-07-02 NOTE — Progress Notes (Signed)
Gastroenterology Office Note     Primary Care Physician:  Pcp, No  Primary Gastroenterologist: Dr. Gala Romney    Chief Complaint   Chief Complaint  Patient presents with   Hosptial follow up    Patient here today due to several recent hospitalizations, One on 06/10/2022 due to Acute alcoholic pancreatitis, the other on 06/23/2022 due to Transaminitis. Patient says she has some abdominal swelling and pain. She says she feels a knot in the periumbilical area.      History of Present Illness   Veronica Calhoun is a 37 y.o. female presenting today in follow-up with a history of alcohol-induced pancreatitis s/p two admissions recently. Most recent admission in late February due to continued pain, anasarca, and elevated LFTs.  She was felt to have abnormal LFTs likely due to possible alcoholic hepatitis ([id number]) +/- pancreatitis. Her LFTs were improving during hospitalization and had extensive serologies overall unrevealing. Acute hep panel negative, negative Hemochromatosis DNA by PCR, AMA negative, iron sats mildly elevated at 34 and ferritin 705 (suspect reactant), IgG mildly elevated in the 1800 range, negative ASMA, negative ANA,    Early satiety. No worsening pain with eating. Pain in upper abdomen. Feels like a knot above umbilicus. No N/V. No ETOH. Overall feels improved since discharge. Worried about a knot at umbilicus.   BM this morning. Usually about 2 per day. Takes Miralax. Taking oxycodone but feels this doesn't help much.  She states she is eating fried/fatty foods here and there. Following low fat "but not following it. Yes and no". Ate half a calzone yesterday. Fried chicken last week.     Past Medical History:  Diagnosis Date   Anxiety 2013   Generalized anxiety disorder 04/01/2013   Hypertension 2014    Past Surgical History:  Procedure Laterality Date   CESAREAN SECTION  03/18/2007    Current Outpatient Medications  Medication Sig Dispense Refill    albuterol (VENTOLIN HFA) 108 (90 Base) MCG/ACT inhaler Inhale 2 puffs into the lungs every 6 (six) hours as needed for wheezing or shortness of breath. 8 g 2   cloNIDine (CATAPRES) 0.2 MG tablet Take 1 tablet (0.2 mg total) by mouth 2 (two) times daily. 60 tablet 1   folic acid (FOLVITE) 1 MG tablet Take 1 tablet (1 mg total) by mouth daily. 30 tablet 0   furosemide (LASIX) 40 MG tablet Take 1 tablet (40 mg total) by mouth daily for 7 days. 7 tablet 0   metoprolol tartrate (LOPRESSOR) 50 MG tablet Take 1 tablet (50 mg total) by mouth 2 (two) times daily. 60 tablet 1   oxycodone (OXY-IR) 5 MG capsule Take 1 capsule (5 mg total) by mouth every 6 (six) hours as needed. 30 capsule 0   pantoprazole (PROTONIX) 40 MG tablet Take 1 tablet (40 mg total) by mouth daily. 30 tablet 1   polyethylene glycol (MIRALAX) 17 g packet Take 17 g by mouth daily as needed for mild constipation, moderate constipation or severe constipation. 14 each 0   docusate sodium (COLACE) 100 MG capsule Take 2 capsules (200 mg total) by mouth 2 (two) times daily as needed for mild constipation or moderate constipation. (Patient not taking: Reported on 07/02/2022) 60 capsule 0   thiamine (VITAMIN B-1) 100 MG tablet Take 1 tablet (100 mg total) by mouth daily. (Patient not taking: Reported on 07/02/2022) 30 tablet 0   No current facility-administered medications for this visit.    Allergies as of 07/02/2022 - Review Complete  07/02/2022  Allergen Reaction Noted   Lisinopril Swelling 12/16/2018   Amlodipine Other (See Comments) 10/11/2018    Family History  Problem Relation Age of Onset   Heart murmur Father    AAA (abdominal aortic aneurysm) Sister    Urinary tract infection Daughter    Hypertension Maternal Grandmother     Social History   Socioeconomic History   Marital status: Single    Spouse name: Not on file   Number of children: 1   Years of education: Not on file   Highest education level: Some college, no degree   Occupational History   Not on file  Tobacco Use   Smoking status: Every Day    Packs/day: 0.50    Years: 10.00    Total pack years: 5.00    Types: Cigarettes   Smokeless tobacco: Never  Vaping Use   Vaping Use: Never used  Substance and Sexual Activity   Alcohol use: Yes    Alcohol/week: 10.0 standard drinks of alcohol    Types: 10 Cans of beer per week    Comment: 4-5 beers daily   Drug use: No   Sexual activity: Yes    Partners: Male    Birth control/protection: None  Other Topics Concern   Not on file  Social History Narrative   Lives in [place] with daughter   Has long term boyfriend.   Social Determinants of Health   Financial Resource Strain: Not on file  Food Insecurity: No Food Insecurity (06/24/2022)   Hunger Vital Sign    Worried About Running Out of Food in the Last Year: Never true    Ran Out of Food in the Last Year: Never true  Transportation Needs: No Transportation Needs (06/24/2022)   PRAPARE - Hydrologist (Medical): No    Lack of Transportation (Non-Medical): No  Physical Activity: Inactive (09/02/2017)   Exercise Vital Sign    Days of Exercise per Week: 0 days    Minutes of Exercise per Session: 0 min  Stress: Stress Concern Present (09/02/2017)   [place]    Feeling of Stress : To some extent  Social Connections: Not on file  Intimate Partner Violence: Not At Risk (06/24/2022)   Humiliation, Afraid, Rape, and Kick questionnaire    Fear of Current or Ex-Partner: No    Emotionally Abused: No    Physically Abused: No    Sexually Abused: No     Review of Systems   Gen: Denies any fever, chills, fatigue, weight loss, lack of appetite.  CV: Denies chest pain, heart palpitations, peripheral edema, syncope.  Resp: Denies shortness of breath at rest or with exertion. Denies wheezing or cough.  GI: Denies dysphagia or odynophagia. Denies jaundice,  hematemesis, fecal incontinence. GU : Denies urinary burning, urinary frequency, urinary hesitancy MS: Denies joint pain, muscle weakness, cramps, or limitation of movement.  Derm: Denies rash, itching, dry skin Psych: Denies depression, anxiety, memory loss, and confusion Heme: Denies bruising, bleeding, and enlarged lymph nodes.   Physical Exam   BP (!) 166/121 (BP Location: Left Arm, Patient Position: Sitting, Cuff Size: Large)   Pulse 93   Temp (!) 97.3 F (36.3 C) (Temporal)   Ht '5\' 6"'$  (1.676 m)   Wt 263 lb 8 oz (119.5 kg)   LMP 06/09/2022 (Exact Date)   BMI 42.53 kg/m  General:   Alert and oriented. Pleasant and cooperative. Well-nourished and well-developed.  Head:  Normocephalic and atraumatic. Eyes:  Without icterus Abdomen:  +BS, soft, mildly TTP supraumbilical and at umbilicus and non-distended. No HSM noted. No guarding or rebound. No masses appreciated. No mass appreciated at umbilicus Rectal:  Deferred  Msk:  Symmetrical without gross deformities. Normal posture. Extremities:  Without edema. Neurologic:  Alert and  oriented x4;  grossly normal neurologically. Skin:  Intact without significant lesions or rashes. Psych:  Alert and cooperative. Normal mood and affect.   Assessment   Laverne Bullis Bandel is a 37 y.o. female presenting today in follow-up with a history of alcohol-induced pancreatitis s/p two admissions recently. Most recent admission in late February due to continued pain, anasarca, and elevated LFTs.  Clinically, she is improving from most recent hospitalization. However, we discussed diet changes as she does endorse higher fatty/greasy foods. Oxycodone had been prescribed at discharge, and she states this is not very helpful at this time. In no distress today. No indication for updated imaging; however, I have asked her to call if any worsening pain or unable to tolerate diet.   Abnormal LFTs: in setting of alcoholic hepatitis and pancreatitis. We will  recheck this again next week.   Lower extremity edema: she was given Lasix for one week following discharge. Will still need to continue this at least another week. I have requested BMP today as may need low dose potassium to supplement concomitantly.      PLAN    Low fat diet Continue Lasix 40 mg another week BMP today Will need HFP and INR next week Follow-up in 6 weeks Arrange dedicated CT pancreas in 6 weeks    Annitta Needs, PhD, ANP-BC [facility] Gastroenterology

## 2022-07-03 ENCOUNTER — Encounter: Payer: Self-pay | Admitting: Gastroenterology

## 2022-07-09 NOTE — Telephone Encounter (Signed)
[name]/[name]/[name]:  Patient needs CT abd/pelvis with contrast this week: history of pancreatitis and worsening abdominal pain. I know it will likely be in [place].

## 2022-07-09 NOTE — Addendum Note (Signed)
Addended by: Cheron Every on: 07/09/2022 01:50 PM   Modules accepted: Orders

## 2022-07-10 ENCOUNTER — Encounter (HOSPITAL_COMMUNITY): Payer: Self-pay | Admitting: Emergency Medicine

## 2022-07-10 ENCOUNTER — Ambulatory Visit: Payer: Medicaid Other | Admitting: Gastroenterology

## 2022-07-10 ENCOUNTER — Other Ambulatory Visit: Payer: Self-pay

## 2022-07-10 ENCOUNTER — Ambulatory Visit (HOSPITAL_COMMUNITY): Payer: Medicaid Other

## 2022-07-10 ENCOUNTER — Emergency Department (HOSPITAL_COMMUNITY)
Admission: EM | Admit: 2022-07-10 | Discharge: 2022-07-10 | Disposition: A | Discharge status: Home / Self Care | Payer: Medicaid Other | Attending: Emergency Medicine | Admitting: Emergency Medicine

## 2022-07-10 ENCOUNTER — Emergency Department (HOSPITAL_COMMUNITY): Payer: Medicaid Other

## 2022-07-10 DIAGNOSIS — K8500 Idiopathic acute pancreatitis without necrosis or infection: Secondary | ICD-10-CM | POA: Diagnosis not present

## 2022-07-10 DIAGNOSIS — R1084 Generalized abdominal pain: Secondary | ICD-10-CM | POA: Diagnosis present

## 2022-07-10 HISTORY — DX: Acute pancreatitis without necrosis or infection, unspecified: K85.90

## 2022-07-10 LAB — CBC
HCT: 34.9 % — ABNORMAL LOW (ref 36.0–46.0)
Hemoglobin: 11.3 g/dL — ABNORMAL LOW (ref 12.0–15.0)
MCH: 32.5 pg (ref 26.0–34.0)
MCHC: 32.4 g/dL (ref 30.0–36.0)
MCV: 100.3 fL — ABNORMAL HIGH (ref 80.0–100.0)
Platelets: 396 10*3/uL (ref 150–400)
RBC: 3.48 MIL/uL — ABNORMAL LOW (ref 3.87–5.11)
RDW: 14.8 % (ref 11.5–15.5)
WBC: 9 10*3/uL (ref 4.0–10.5)
nRBC: 0 % (ref 0.0–0.2)

## 2022-07-10 LAB — COMPREHENSIVE METABOLIC PANEL
ALT: 25 U/L (ref 0–44)
AST: 49 U/L — ABNORMAL HIGH (ref 15–41)
Albumin: 3 g/dL — ABNORMAL LOW (ref 3.5–5.0)
Alkaline Phosphatase: 143 U/L — ABNORMAL HIGH (ref 38–126)
Anion gap: 8 (ref 5–15)
BUN: <5 mg/dL — ABNORMAL LOW (ref 6–20)
CO2: 25 mmol/L (ref 22–32)
Calcium: 8.3 mg/dL — ABNORMAL LOW (ref 8.9–10.3)
Chloride: 104 mmol/L (ref 98–111)
Creatinine, Ser: 0.53 mg/dL (ref 0.44–1.00)
GFR, Estimated: >60 mL/min (ref >60)
Glucose, Bld: 93 mg/dL (ref 70–99)
Potassium: 2.8 mmol/L — ABNORMAL LOW (ref 3.5–5.1)
Sodium: 137 mmol/L (ref 135–145)
Total Bilirubin: 2.1 mg/dL — ABNORMAL HIGH (ref 0.3–1.2)
Total Protein: 7.9 g/dL (ref 6.5–8.1)

## 2022-07-10 LAB — URINALYSIS, ROUTINE W REFLEX MICROSCOPIC
Bilirubin Urine: NEGATIVE
Glucose, UA: NEGATIVE mg/dL
Hgb urine dipstick: NEGATIVE
Ketones, ur: NEGATIVE mg/dL
Leukocytes,Ua: NEGATIVE
Nitrite: NEGATIVE
Protein, ur: 30 mg/dL — AB
Specific Gravity, Urine: 1.008 (ref 1.005–1.030)
pH: 6 (ref 5.0–8.0)

## 2022-07-10 LAB — LIPASE, BLOOD: Lipase: 52 U/L — ABNORMAL HIGH (ref 11–51)

## 2022-07-10 LAB — POC URINE PREG, ED: Preg Test, Ur: NEGATIVE

## 2022-07-10 MED ORDER — ONDANSETRON 4 MG PO TBDP: ORAL_TABLET | ORAL | 0 refills | Status: DC

## 2022-07-10 MED ORDER — HYDROCODONE-ACETAMINOPHEN 5-325 MG PO TABS: 1.0000 | ORAL_TABLET | Freq: Four times a day (QID) | ORAL | 0 refills | Status: DC | PRN

## 2022-07-10 MED ORDER — POTASSIUM CHLORIDE CRYS ER 20 MEQ PO TBCR
40.0000 meq | EXTENDED_RELEASE_TABLET | Freq: Once | ORAL | Status: AC
  Administered 2022-07-10: 40 meq via ORAL
  Filled 2022-07-10: qty 2

## 2022-07-10 MED ORDER — HYDROMORPHONE HCL 1 MG/ML IJ SOLN
0.5000 mg | Freq: Once | INTRAMUSCULAR | Status: AC
  Administered 2022-07-10: 0.5 mg via INTRAVENOUS
  Filled 2022-07-10: qty 0.5

## 2022-07-10 MED ORDER — IOHEXOL 300 MG/ML  SOLN
100.0000 mL | Freq: Once | INTRAMUSCULAR | Status: AC | PRN
  Administered 2022-07-10: 100 mL via INTRAVENOUS

## 2022-07-10 MED ORDER — POTASSIUM CHLORIDE CRYS ER 20 MEQ PO TBCR: 20.0000 meq | EXTENDED_RELEASE_TABLET | Freq: Every day | ORAL | 0 refills | Status: DC

## 2022-07-10 NOTE — ED Triage Notes (Signed)
Pt c/o umbilicus pain that started a week ago. Pt was seen by GI doctor who scheduled a CT for today however pt was unable to make that appt in [place]. Pt has been nauseated without emesis or diarrhea. Denies any fevers. Pt was recently hospitalized for pancreatitis.

## 2022-07-10 NOTE — ED Provider Notes (Signed)
[facility] Provider Note   CSN: [id number] Arrival date & time: 07/10/22  2005     History  No chief complaint on file.   Veronica Calhoun is a 37 y.o. female.  Patient has a history of pancreatitis.  She recently was admitted for pancreatitis.  She continues to have some periumbilical discomfort.  The history is provided by the patient and medical records. No language interpreter was used.  Abdominal Pain Pain location:  Generalized Pain quality: aching   Pain radiates to:  Does not radiate Pain severity:  Moderate Onset quality:  Gradual Timing:  Constant Progression:  Waxing and waning Chronicity:  Recurrent Context: alcohol use   Relieved by:  Nothing Worsened by:  Nothing Associated symptoms: no chest pain, no cough, no diarrhea, no fatigue and no hematuria        Home Medications Prior to Admission medications   Medication Sig Start Date End Date Taking? Authorizing Provider  HYDROcodone-acetaminophen (NORCO/VICODIN) 5-325 MG tablet Take 1 tablet by mouth every 6 (six) hours as needed. 07/10/22  Yes Milton Ferguson, MD  ondansetron (ZOFRAN-ODT) 4 MG disintegrating tablet 4mg  ODT q4 hours prn nausea/vomit 07/10/22  Yes Milton Ferguson, MD  potassium chloride SA (KLOR-CON M) 20 MEQ tablet Take 1 tablet (20 mEq total) by mouth daily. 07/10/22  Yes Milton Ferguson, MD  albuterol (VENTOLIN HFA) 108 (90 Base) MCG/ACT inhaler Inhale 2 puffs into the lungs every 6 (six) hours as needed for wheezing or shortness of breath. 06/23/22   Manuella Ghazi, Pratik D, DO  cloNIDine (CATAPRES) 0.2 MG tablet Take 1 tablet (0.2 mg total) by mouth 2 (two) times daily. 06/23/22 08/22/22  Manuella Ghazi, Pratik D, DO  folic acid (FOLVITE) 1 MG tablet Take 1 tablet (1 mg total) by mouth daily. 06/23/22 07/23/22  Manuella Ghazi, Pratik D, DO  furosemide (LASIX) 40 MG tablet Take 1 tablet (40 mg total) by mouth daily. 07/02/22   Annitta Needs, NP  metoprolol tartrate (LOPRESSOR) 50 MG tablet  Take 1 tablet (50 mg total) by mouth 2 (two) times daily. 06/23/22 08/22/22  Manuella Ghazi, Pratik D, DO  oxycodone (OXY-IR) 5 MG capsule Take 1 capsule (5 mg total) by mouth every 6 (six) hours as needed. 06/26/22   Amin, Jeanella Flattery, MD  pantoprazole (PROTONIX) 40 MG tablet Take 1 tablet (40 mg total) by mouth daily. 06/23/22 06/23/23  Manuella Ghazi, Pratik D, DO  polyethylene glycol (MIRALAX) 17 g packet Take 17 g by mouth daily as needed for mild constipation, moderate constipation or severe constipation. 06/23/22   Manuella Ghazi, Pratik D, DO      Allergies    Lisinopril and Amlodipine    Review of Systems   Review of Systems  Constitutional:  Negative for appetite change and fatigue.  HENT:  Negative for congestion, ear discharge and sinus pressure.   Eyes:  Negative for discharge.  Respiratory:  Negative for cough.   Cardiovascular:  Negative for chest pain.  Gastrointestinal:  Positive for abdominal pain. Negative for diarrhea.  Genitourinary:  Negative for frequency and hematuria.  Musculoskeletal:  Negative for back pain.  Skin:  Negative for rash.  Neurological:  Negative for seizures and headaches.  Psychiatric/Behavioral:  Negative for hallucinations.     Physical Exam Updated Vital Signs BP (!) 198/132 (BP Location: Right Arm)   Pulse 86   Temp (!) 97.5 F (36.4 C) (Oral)   Resp 17   Ht 5\' 6"  (1.676 m)   Wt 118.8 kg  LMP 06/09/2022 (Exact Date)   SpO2 100%   BMI 42.29 kg/m  Physical Exam Vitals and nursing note reviewed.  Constitutional:      Appearance: She is well-developed.  HENT:     Head: Normocephalic.     Nose: Nose normal.  Eyes:     General: No scleral icterus.    Conjunctiva/sclera: Conjunctivae normal.  Neck:     Thyroid: No thyromegaly.  Cardiovascular:     Rate and Rhythm: Normal rate and regular rhythm.     Heart sounds: No murmur heard.    No friction rub. No gallop.  Pulmonary:     Breath sounds: No stridor. No wheezing or rales.  Chest:     Chest wall: No  tenderness.  Abdominal:     General: There is no distension.     Tenderness: There is abdominal tenderness. There is no rebound.  Musculoskeletal:        General: Normal range of motion.     Cervical back: Neck supple.  Lymphadenopathy:     Cervical: No cervical adenopathy.  Skin:    Findings: No erythema or rash.  Neurological:     Mental Status: She is alert and oriented to person, place, and time.     Motor: No abnormal muscle tone.     Coordination: Coordination normal.  Psychiatric:        Behavior: Behavior normal.     ED Results / Procedures / Treatments   Labs (all labs ordered are listed, but only abnormal results are displayed) Labs Reviewed  LIPASE, BLOOD - Abnormal; Notable for the following components:      Result Value   Lipase 52 (*)    All other components within normal limits  COMPREHENSIVE METABOLIC PANEL - Abnormal; Notable for the following components:   Potassium 2.8 (*)    BUN <5 (*)    Calcium 8.3 (*)    Albumin 3.0 (*)    AST 49 (*)    Alkaline Phosphatase 143 (*)    Total Bilirubin 2.1 (*)    All other components within normal limits  CBC - Abnormal; Notable for the following components:   RBC 3.48 (*)    Hemoglobin 11.3 (*)    HCT 34.9 (*)    MCV 100.3 (*)    All other components within normal limits  URINALYSIS, ROUTINE W REFLEX MICROSCOPIC - Abnormal; Notable for the following components:   Protein, ur 30 (*)    Bacteria, UA RARE (*)    All other components within normal limits  POC URINE PREG, ED    EKG None  Radiology CT ABDOMEN PELVIS W CONTRAST  Result Date: 07/10/2022 CLINICAL DATA:  Abdominal pain, acute, nonlocalized EXAM: CT ABDOMEN AND PELVIS WITH CONTRAST TECHNIQUE: Multidetector CT imaging of the abdomen and pelvis was performed using the standard protocol following bolus administration of intravenous contrast. RADIATION DOSE REDUCTION: This exam was performed according to the departmental dose-optimization program which  includes automated exposure control, adjustment of the mA and/or kV according to patient size and/or use of iterative reconstruction technique. CONTRAST:  153mL OMNIPAQUE IOHEXOL 300 MG/ML  SOLN COMPARISON:  06/24/2022 FINDINGS: Lower chest: No acute abnormality Hepatobiliary: No focal hepatic abnormality. Gallbladder unremarkable. Pancreas: Inflammatory changes surrounding the pancreas compatible with acute pancreatitis. Fluid collections in the left upper quadrant adjacent to the pancreatic tail and spleen as well as along the greater curvature of the stomach are again noted, similar to prior study. Small cystic area in the uncinate process  measuring 1.6 cm. Spleen: No focal abnormality.  Normal size. Adrenals/Urinary Tract: No adrenal abnormality. No focal renal abnormality. No stones or hydronephrosis. Urinary bladder is unremarkable. Stomach/Bowel: Stomach, large and small bowel grossly unremarkable. Vascular/Lymphatic: No evidence of aneurysm or adenopathy. Reproductive: Uterus and adnexa unremarkable.  No mass. Other: Free fluid adjacent to the liver, in the right paracolic gutter and extending into the pelvis. This is similar to prior study. No free air. Musculoskeletal: No acute bony abnormality. IMPRESSION: Continue changes of acute pancreatitis with surrounding inflammation and fluid collections in the left upper quadrant and adjacent to the stomach, similar to prior study. Mild free fluid in the right abdomen and pelvis, stable. Electronically Signed   By: Rolm Baptise M.D.   On: 07/10/2022 23:13    Procedures Procedures    Medications Ordered in ED Medications  potassium chloride SA (KLOR-CON M) CR tablet 40 mEq (has no administration in time range)  HYDROmorphone (DILAUDID) injection 0.5 mg (0.5 mg Intravenous Given 07/10/22 2254)  iohexol (OMNIPAQUE) 300 MG/ML solution 100 mL (100 mLs Intravenous Contrast Given 07/10/22 2302)    ED Course/ Medical Decision Making/ A&P                              This patient presents to the ED for concern of abdominal pain, this involves an extensive number of treatment options, and is a complaint that carries with it a high risk of complications and morbidity.  The differential diagnosis includes gastritis, pancreatitis   Co morbidities that complicate the patient evaluation  Pancreatitis   Additional history obtained:  Additional history obtained from patient External records from outside source obtained and reviewed including hospital records   Lab Tests:  I Ordered, and personally interpreted labs.  The pertinent results include: Lipase 52, potassium 2.8, white count normal at 9   Imaging Studies ordered:  I ordered imaging studies including CT abdomen I independently visualized and interpreted imaging which showed pancreatitis I agree with the radiologist interpretation   Cardiac Monitoring: / EKG:  The patient was maintained on a cardiac monitor.  I personally viewed and interpreted the cardiac monitored which showed an underlying rhythm of: Normal sinus rhythm   Consultations Obtained:  No consultant  Problem List / ED Course / Critical interventions / Medication management  Cryptitis I ordered medication including Dilaudid for pain Reevaluation of the patient after these medicines showed that the patient improved I have reviewed the patients home medicines and have made adjustments as needed   Social Determinants of Health:  None   Test / Admission - Considered:  None  Patient with pancreatitis.  And hypokalemia.  She does not want to be admitted again so she will be treated with Vicodin and Zofran and potassium and will follow-up with GI       Final Clinical Impression(s) / ED Diagnoses Final diagnoses:  Idiopathic acute pancreatitis without infection or necrosis    Rx / DC Orders ED Discharge Orders          Ordered    HYDROcodone-acetaminophen (NORCO/VICODIN) 5-325 MG tablet  Every 6 hours  PRN        07/10/22 2338    ondansetron (ZOFRAN-ODT) 4 MG disintegrating tablet        07/10/22 2338    potassium chloride SA (KLOR-CON M) 20 MEQ tablet  Daily        07/10/22 2338  Milton Ferguson, MD 07/11/22 1258

## 2022-07-10 NOTE — Discharge Instructions (Signed)
Follow back up with the gastroenterologist next week.  Return sooner for problems

## 2022-07-15 ENCOUNTER — Ambulatory Visit: Payer: Medicaid Other | Admitting: Gastroenterology

## 2022-07-16 ENCOUNTER — Ambulatory Visit: Payer: Medicaid Other | Admitting: Family Medicine

## 2022-07-25 ENCOUNTER — Encounter: Payer: Self-pay | Admitting: Family Medicine

## 2022-07-25 ENCOUNTER — Ambulatory Visit (INDEPENDENT_AMBULATORY_CARE_PROVIDER_SITE_OTHER): Payer: Medicaid Other | Admitting: Family Medicine

## 2022-07-25 VITALS — BP 180/120 | HR 83 | Ht 66.0 in | Wt 230.0 lb

## 2022-07-25 DIAGNOSIS — E785 Hyperlipidemia, unspecified: Secondary | ICD-10-CM

## 2022-07-25 DIAGNOSIS — E039 Hypothyroidism, unspecified: Secondary | ICD-10-CM | POA: Diagnosis not present

## 2022-07-25 DIAGNOSIS — I10 Essential (primary) hypertension: Secondary | ICD-10-CM | POA: Diagnosis not present

## 2022-07-25 DIAGNOSIS — R7301 Impaired fasting glucose: Secondary | ICD-10-CM | POA: Diagnosis not present

## 2022-07-25 DIAGNOSIS — Z3200 Encounter for pregnancy test, result unknown: Secondary | ICD-10-CM

## 2022-07-25 MED ORDER — OLMESARTAN MEDOXOMIL-HCTZ 40-12.5 MG PO TABS: 1.0000 | ORAL_TABLET | Freq: Every day | ORAL | 1 refills | Status: DC

## 2022-07-25 NOTE — Progress Notes (Signed)
New Patient Office Visit   Subjective   Patient ID: Veronica Calhoun, female    DOB: 1985-06-02  Age: 37 y.o. MRN: [id number]  CC:  Chief Complaint  Patient presents with   Establish Care   Hypertension    HPI Veronica Calhoun 37 year old female, presents to establish care. She  has a past medical history of Anxiety (2013), Generalized anxiety disorder (04/01/2013), Hypertension (2014), and Pancreatitis.  Patient here for elevated blood pressure. She is exercising and is not adherent to low salt diet.  Blood pressure is unknown if controlled at home. Patient denies cardiac symptoms chest pain, chest pressure/discomfort, dyspnea, lower extremity edema, near-syncope, syncope, and tachypnea. Cardiovascular risk factors: family history of premature cardiovascular disease, hypertension, obesity (BMI >= 30 kg/m2), sedentary lifestyle, and smoking/ tobacco exposure.     Outpatient Encounter Medications as of 07/25/2022  Medication Sig   albuterol (VENTOLIN HFA) 108 (90 Base) MCG/ACT inhaler Inhale 2 puffs into the lungs every 6 (six) hours as needed for wheezing or shortness of breath.   cloNIDine (CATAPRES) 0.2 MG tablet Take 1 tablet (0.2 mg total) by mouth 2 (two) times daily.   metoprolol tartrate (LOPRESSOR) 50 MG tablet Take 1 tablet (50 mg total) by mouth 2 (two) times daily.   olmesartan-hydrochlorothiazide (BENICAR HCT) 40-12.5 MG tablet Take 1 tablet by mouth daily.   ondansetron (ZOFRAN-ODT) 4 MG disintegrating tablet 4mg  ODT q4 hours prn nausea/vomit   [DISCONTINUED] hydrochlorothiazide (HYDRODIURIL) 25 MG tablet Take by mouth.   HYDROcodone-acetaminophen (NORCO/VICODIN) 5-325 MG tablet Take 1 tablet by mouth every 6 (six) hours as needed.   oxycodone (OXY-IR) 5 MG capsule Take 1 capsule (5 mg total) by mouth every 6 (six) hours as needed.   pantoprazole (PROTONIX) 40 MG tablet Take 1 tablet (40 mg total) by mouth daily.   polyethylene glycol (MIRALAX) 17 g packet Take 17 g by  mouth daily as needed for mild constipation, moderate constipation or severe constipation.   potassium chloride SA (KLOR-CON M) 20 MEQ tablet Take 1 tablet (20 mEq total) by mouth daily.   [DISCONTINUED] furosemide (LASIX) 40 MG tablet Take 1 tablet (40 mg total) by mouth daily. (Patient not taking: Reported on 07/25/2022)   No facility-administered encounter medications on file as of 07/25/2022.    Past Surgical History:  Procedure Laterality Date   CESAREAN SECTION  03/18/2007    Review of Systems  Constitutional:  Negative for chills and fever.  Gastrointestinal:  Negative for nausea and vomiting.  Genitourinary:  Negative for dysuria.  Musculoskeletal:  Negative for myalgias.      Objective    BP (!) 180/120   Pulse 83   Ht 5\' 6"  (1.676 m)   Wt 230 lb (104.3 kg)   SpO2 98%   BMI 37.12 kg/m   Physical Exam Cardiovascular:     Rate and Rhythm: Normal rate and regular rhythm.     Pulses: Normal pulses.  Pulmonary:     Effort: Pulmonary effort is normal.     Breath sounds: Normal breath sounds.  Abdominal:     General: Bowel sounds are normal.     Palpations: Abdomen is soft.     Tenderness: There is no right CVA tenderness, left CVA tenderness or guarding.  Musculoskeletal:        General: Normal range of motion.  Skin:    General: Skin is warm and dry.     Capillary Refill: Capillary refill takes less than 2 seconds.  Neurological:  General: No focal deficit present.     Mental Status: She is alert.     Coordination: Coordination normal.     Gait: Gait normal.  Psychiatric:        Thought Content: Thought content normal.       Assessment & Plan:  Primary hypertension -     CBC with Differential/Platelet -     CMP14+EGFR -     Microalbumin / creatinine urine ratio -     Olmesartan Medoxomil-HCTZ; Take 1 tablet by mouth daily.  Dispense: 30 tablet; Refill: 1  IFG (impaired fasting glucose) -     Hemoglobin A1c  Hyperlipidemia, unspecified  hyperlipidemia type -     Lipid panel  Hypothyroidism, unspecified type -     TSH + free T4  Encounter for pregnancy test, result unknown -     POCT urine pregnancy  Essential hypertension Assessment & Plan: Vitals:   07/25/22 0813 07/25/22 0847  BP: (!) 215/144 (!) 180/120   Blood pressure not controlled  Patient reported taking metoprolol 50 mg and clonidine 0.2 mg this morning. Started patient on Olmesartan-HCTZ 40-12.5 mg in today's visit.         Explained non pharmacological interventions such as low salt, DASH diet discussed. Educated on stress reduction and physical activity. Discussed signs and symptoms of major cardiovascular event and need to present to the ED. Follow up 1 week with blood pressure reading logs. Patient verbalizes understanding regarding plan of care and all questions answered.      Return in about 1 week (around 08/01/2022) for re-check blood pressure, Insomnia, muscle cramps.   Renard Hamper Ria Comment, FNP

## 2022-07-25 NOTE — Patient Instructions (Signed)
It was pleasure meeting with you today. Please take medications as prescribed. Follow up with your primary health provider if any health concerns arises. If symptoms worsen please contact your primary care provider and/or visit the emergency department.  

## 2022-07-25 NOTE — Assessment & Plan Note (Signed)
Vitals:   07/25/22 0813 07/25/22 0847  BP: (!) 215/144 (!) 180/120   Blood pressure not controlled  Patient reported taking metoprolol 50 mg and clonidine 0.2 mg this morning. Started patient on Olmesartan-HCTZ 40-12.5 mg in today's visit.         Explained non pharmacological interventions such as low salt, DASH diet discussed. Educated on stress reduction and physical activity. Discussed signs and symptoms of major cardiovascular event and need to present to the ED. Follow up 1 week with blood pressure reading logs. Patient verbalizes understanding regarding plan of care and all questions answered.

## 2022-07-27 LAB — CBC WITH DIFFERENTIAL/PLATELET
Basophils Absolute: 0.1 10*3/uL (ref 0.0–0.2)
Basos: 1 %
EOS (ABSOLUTE): 0.3 10*3/uL (ref 0.0–0.4)
Eos: 5 %
Hematocrit: 34 % (ref 34.0–46.6)
Hemoglobin: 12 g/dL (ref 11.1–15.9)
Immature Grans (Abs): 0 10*3/uL (ref 0.0–0.1)
Immature Granulocytes: 0 %
Lymphocytes Absolute: 1.8 10*3/uL (ref 0.7–3.1)
Lymphs: 26 %
MCH: 32 pg (ref 26.6–33.0)
MCHC: 35.3 g/dL (ref 31.5–35.7)
MCV: 91 fL (ref 79–97)
Monocytes Absolute: 0.5 10*3/uL (ref 0.1–0.9)
Monocytes: 7 %
Neutrophils Absolute: 4.1 10*3/uL (ref 1.4–7.0)
Neutrophils: 61 %
Platelets: 334 10*3/uL (ref 150–450)
RBC: 3.75 x10E6/uL — ABNORMAL LOW (ref 3.77–5.28)
RDW: 12.5 % (ref 11.7–15.4)
WBC: 6.7 10*3/uL (ref 3.4–10.8)

## 2022-07-27 LAB — HEMOGLOBIN A1C
Est. average glucose Bld gHb Est-mCnc: 88 mg/dL
Hgb A1c MFr Bld: 4.7 % — ABNORMAL LOW (ref 4.8–5.6)

## 2022-07-27 LAB — CMP14+EGFR
ALT: 17 IU/L (ref 0–32)
AST: 33 IU/L (ref 0–40)
Albumin/Globulin Ratio: 0.9 — ABNORMAL LOW (ref 1.2–2.2)
Albumin: 3.5 g/dL — ABNORMAL LOW (ref 3.9–4.9)
Alkaline Phosphatase: 132 IU/L — ABNORMAL HIGH (ref 44–121)
BUN/Creatinine Ratio: 4 — ABNORMAL LOW (ref 9–23)
BUN: 2 mg/dL — ABNORMAL LOW (ref 6–20)
Bilirubin Total: 1.5 mg/dL — ABNORMAL HIGH (ref 0.0–1.2)
CO2: 21 mmol/L (ref 20–29)
Calcium: 9.3 mg/dL (ref 8.7–10.2)
Chloride: 104 mmol/L (ref 96–106)
Creatinine, Ser: 0.51 mg/dL — ABNORMAL LOW (ref 0.57–1.00)
Globulin, Total: 3.7 g/dL (ref 1.5–4.5)
Glucose: 97 mg/dL (ref 70–99)
Potassium: 3.4 mmol/L — ABNORMAL LOW (ref 3.5–5.2)
Sodium: 140 mmol/L (ref 134–144)
Total Protein: 7.2 g/dL (ref 6.0–8.5)
eGFR: 126 mL/min/{1.73_m2} (ref >59)

## 2022-07-27 LAB — LIPID PANEL
Chol/HDL Ratio: 6.9 ratio — ABNORMAL HIGH (ref 0.0–4.4)
Cholesterol, Total: 200 mg/dL — ABNORMAL HIGH (ref 100–199)
HDL: 29 mg/dL — ABNORMAL LOW (ref >39)
LDL Chol Calc (NIH): 149 mg/dL — ABNORMAL HIGH (ref 0–99)
Triglycerides: 118 mg/dL (ref 0–149)
VLDL Cholesterol Cal: 22 mg/dL (ref 5–40)

## 2022-07-27 LAB — MICROALBUMIN / CREATININE URINE RATIO
Creatinine, Urine: 109.2 mg/dL
Microalb/Creat Ratio: 365 mg/g creat — ABNORMAL HIGH (ref 0–29)
Microalbumin, Urine: 398.3 ug/mL

## 2022-07-27 LAB — TSH+FREE T4
Free T4: 1.13 ng/dL (ref 0.82–1.77)
TSH: 1.42 u[IU]/mL (ref 0.450–4.500)

## 2022-08-01 ENCOUNTER — Telehealth: Payer: Self-pay | Admitting: Family Medicine

## 2022-08-01 NOTE — Telephone Encounter (Signed)
[employer] forms-medical certification   Copied Noted Sleeved  Put in providers box (patient has appt 04.03.2024)  Forms need to be faxed to [phone]

## 2022-08-06 ENCOUNTER — Ambulatory Visit: Payer: Medicaid Other | Admitting: Family Medicine

## 2022-08-11 ENCOUNTER — Telehealth: Payer: Self-pay | Admitting: Family Medicine

## 2022-08-11 NOTE — Telephone Encounter (Signed)
Pt called in regard to [employer] forms.  Pt has not heard anything in regard to forms being completed. Wants a call back with form status

## 2022-08-11 NOTE — Telephone Encounter (Signed)
Form in regards to ?

## 2022-08-12 NOTE — Telephone Encounter (Signed)
Patient called back, forms were dropped off copied, noted and sleeved on 03.29.2024 had a scheduled appt on 04.03.2024 but had to cancel, Patient was seen 03.22.2024, it is the medical certificate.

## 2022-08-12 NOTE — Telephone Encounter (Signed)
Veronica Calhoun has them and is working on them.

## 2022-08-12 NOTE — Telephone Encounter (Signed)
Patient called checking on forms re gave the forms to nurse for [facility]

## 2022-08-13 ENCOUNTER — Ambulatory Visit: Payer: Medicaid Other | Admitting: Gastroenterology

## 2022-08-18 ENCOUNTER — Telehealth: Payer: Self-pay | Admitting: Family Medicine

## 2022-08-18 NOTE — Telephone Encounter (Signed)
Please let patient know forms will be completed on 4/22

## 2022-08-18 NOTE — Telephone Encounter (Signed)
Pt called in for status on forms

## 2022-08-18 NOTE — Telephone Encounter (Signed)
Left VM

## 2022-08-24 ENCOUNTER — Other Ambulatory Visit: Payer: Self-pay | Admitting: Family Medicine

## 2022-08-25 ENCOUNTER — Telehealth: Payer: Self-pay | Admitting: Family Medicine

## 2022-08-25 ENCOUNTER — Other Ambulatory Visit: Payer: Self-pay | Admitting: Family Medicine

## 2022-08-25 DIAGNOSIS — Z0279 Encounter for issue of other medical certificate: Secondary | ICD-10-CM

## 2022-08-25 NOTE — Telephone Encounter (Signed)
Spoke to patient, paper work will be done today by 5:30 pm

## 2022-08-25 NOTE — Telephone Encounter (Signed)
Pt called to return phone call about paper work that was supposed to be finished today.

## 2022-08-29 ENCOUNTER — Other Ambulatory Visit: Payer: Self-pay

## 2022-08-29 ENCOUNTER — Encounter (HOSPITAL_COMMUNITY): Payer: Self-pay

## 2022-08-29 DIAGNOSIS — I10 Essential (primary) hypertension: Secondary | ICD-10-CM | POA: Diagnosis present

## 2022-08-29 DIAGNOSIS — K861 Other chronic pancreatitis: Secondary | ICD-10-CM | POA: Diagnosis present

## 2022-08-29 DIAGNOSIS — K852 Alcohol induced acute pancreatitis without necrosis or infection: Principal | ICD-10-CM | POA: Diagnosis present

## 2022-08-29 DIAGNOSIS — F17210 Nicotine dependence, cigarettes, uncomplicated: Secondary | ICD-10-CM | POA: Diagnosis present

## 2022-08-29 DIAGNOSIS — F1020 Alcohol dependence, uncomplicated: Secondary | ICD-10-CM | POA: Diagnosis present

## 2022-08-29 DIAGNOSIS — Z79899 Other long term (current) drug therapy: Secondary | ICD-10-CM

## 2022-08-29 DIAGNOSIS — E876 Hypokalemia: Secondary | ICD-10-CM | POA: Diagnosis present

## 2022-08-29 DIAGNOSIS — K219 Gastro-esophageal reflux disease without esophagitis: Secondary | ICD-10-CM | POA: Diagnosis present

## 2022-08-29 DIAGNOSIS — Z8249 Family history of ischemic heart disease and other diseases of the circulatory system: Secondary | ICD-10-CM

## 2022-08-29 DIAGNOSIS — Z765 Malingerer [conscious simulation]: Secondary | ICD-10-CM

## 2022-08-29 NOTE — ED Triage Notes (Signed)
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

## 2022-08-30 ENCOUNTER — Observation Stay (HOSPITAL_COMMUNITY): Payer: Medicaid Other

## 2022-08-30 ENCOUNTER — Inpatient Hospital Stay (HOSPITAL_COMMUNITY)
Admission: EM | Admit: 2022-08-30 | Discharge: 2022-09-02 | DRG: 440 | Disposition: A | Discharge status: Home / Self Care | Payer: Medicaid Other | Attending: Family Medicine | Admitting: Family Medicine

## 2022-08-30 DIAGNOSIS — K219 Gastro-esophageal reflux disease without esophagitis: Secondary | ICD-10-CM | POA: Diagnosis present

## 2022-08-30 DIAGNOSIS — F17200 Nicotine dependence, unspecified, uncomplicated: Secondary | ICD-10-CM | POA: Diagnosis present

## 2022-08-30 DIAGNOSIS — R7401 Elevation of levels of liver transaminase levels: Secondary | ICD-10-CM | POA: Diagnosis not present

## 2022-08-30 DIAGNOSIS — E876 Hypokalemia: Secondary | ICD-10-CM | POA: Diagnosis present

## 2022-08-30 DIAGNOSIS — I10 Essential (primary) hypertension: Secondary | ICD-10-CM

## 2022-08-30 DIAGNOSIS — F1020 Alcohol dependence, uncomplicated: Secondary | ICD-10-CM | POA: Diagnosis present

## 2022-08-30 DIAGNOSIS — K859 Acute pancreatitis without necrosis or infection, unspecified: Secondary | ICD-10-CM

## 2022-08-30 DIAGNOSIS — K852 Alcohol induced acute pancreatitis without necrosis or infection: Secondary | ICD-10-CM | POA: Diagnosis not present

## 2022-08-30 LAB — URINALYSIS, ROUTINE W REFLEX MICROSCOPIC
Glucose, UA: NEGATIVE mg/dL
Hgb urine dipstick: NEGATIVE
Ketones, ur: 5 mg/dL — AB
Leukocytes,Ua: NEGATIVE
Nitrite: NEGATIVE
Protein, ur: >=300 mg/dL — AB
Specific Gravity, Urine: 1.02 (ref 1.005–1.030)
pH: 6 (ref 5.0–8.0)

## 2022-08-30 LAB — CBC
HCT: 39.5 % (ref 36.0–46.0)
HCT: 44.9 % (ref 36.0–46.0)
Hemoglobin: 14 g/dL (ref 12.0–15.0)
Hemoglobin: 16.1 g/dL — ABNORMAL HIGH (ref 12.0–15.0)
MCH: 32.3 pg (ref 26.0–34.0)
MCH: 32.4 pg (ref 26.0–34.0)
MCHC: 35.4 g/dL (ref 30.0–36.0)
MCHC: 35.9 g/dL (ref 30.0–36.0)
MCV: 90.2 fL (ref 80.0–100.0)
MCV: 91.4 fL (ref 80.0–100.0)
Platelets: 262 10*3/uL (ref 150–400)
Platelets: 305 10*3/uL (ref 150–400)
RBC: 4.32 MIL/uL (ref 3.87–5.11)
RBC: 4.98 MIL/uL (ref 3.87–5.11)
RDW: 19 % — ABNORMAL HIGH (ref 11.5–15.5)
RDW: 19.6 % — ABNORMAL HIGH (ref 11.5–15.5)
WBC: 9.7 10*3/uL (ref 4.0–10.5)
WBC: 9.8 10*3/uL (ref 4.0–10.5)
nRBC: 0 % (ref 0.0–0.2)
nRBC: 0 % (ref 0.0–0.2)

## 2022-08-30 LAB — HEPATITIS PANEL, ACUTE
HCV Ab: NONREACTIVE
Hep A IgM: NONREACTIVE
Hep B C IgM: NONREACTIVE
Hepatitis B Surface Ag: NONREACTIVE

## 2022-08-30 LAB — COMPREHENSIVE METABOLIC PANEL
ALT: 59 U/L — ABNORMAL HIGH (ref 0–44)
ALT: 47 U/L — ABNORMAL HIGH (ref 0–44)
AST: 100 U/L — ABNORMAL HIGH (ref 15–41)
AST: 75 U/L — ABNORMAL HIGH (ref 15–41)
Albumin: 3.5 g/dL (ref 3.5–5.0)
Albumin: 3 g/dL — ABNORMAL LOW (ref 3.5–5.0)
Alkaline Phosphatase: 216 U/L — ABNORMAL HIGH (ref 38–126)
Alkaline Phosphatase: 173 U/L — ABNORMAL HIGH (ref 38–126)
Anion gap: 11 (ref 5–15)
Anion gap: 8 (ref 5–15)
BUN: <5 mg/dL — ABNORMAL LOW (ref 6–20)
BUN: <5 mg/dL — ABNORMAL LOW (ref 6–20)
CO2: 26 mmol/L (ref 22–32)
CO2: 26 mmol/L (ref 22–32)
Calcium: 9 mg/dL (ref 8.9–10.3)
Calcium: 8.5 mg/dL — ABNORMAL LOW (ref 8.9–10.3)
Chloride: 99 mmol/L (ref 98–111)
Chloride: 101 mmol/L (ref 98–111)
Creatinine, Ser: 0.5 mg/dL (ref 0.44–1.00)
Creatinine, Ser: 0.45 mg/dL (ref 0.44–1.00)
GFR, Estimated: >60 mL/min (ref >60)
GFR, Estimated: >60 mL/min (ref >60)
Glucose, Bld: 118 mg/dL — ABNORMAL HIGH (ref 70–99)
Glucose, Bld: 104 mg/dL — ABNORMAL HIGH (ref 70–99)
Potassium: 2.7 mmol/L — CL (ref 3.5–5.1)
Potassium: 3 mmol/L — ABNORMAL LOW (ref 3.5–5.1)
Sodium: 136 mmol/L (ref 135–145)
Sodium: 135 mmol/L (ref 135–145)
Total Bilirubin: 2.1 mg/dL — ABNORMAL HIGH (ref 0.3–1.2)
Total Bilirubin: 2 mg/dL — ABNORMAL HIGH (ref 0.3–1.2)
Total Protein: 8.5 g/dL — ABNORMAL HIGH (ref 6.5–8.1)
Total Protein: 7.1 g/dL (ref 6.5–8.1)

## 2022-08-30 LAB — LIPASE, BLOOD: Lipase: 74 U/L — ABNORMAL HIGH (ref 11–51)

## 2022-08-30 LAB — CBG MONITORING, ED: Glucose-Capillary: 101 mg/dL — ABNORMAL HIGH (ref 70–99)

## 2022-08-30 LAB — MAGNESIUM: Magnesium: 1.7 mg/dL (ref 1.7–2.4)

## 2022-08-30 LAB — POC URINE PREG, ED: Preg Test, Ur: NEGATIVE

## 2022-08-30 MED ORDER — NICOTINE 14 MG/24HR TD PT24
14.0000 mg | MEDICATED_PATCH | Freq: Every day | TRANSDERMAL | Status: DC
  Administered 2022-08-30 – 2022-09-01 (×4): 14 mg via TRANSDERMAL
  Filled 2022-08-30 (×5): qty 1

## 2022-08-30 MED ORDER — OLMESARTAN MEDOXOMIL-HCTZ 40-12.5 MG PO TABS: 1.0000 | ORAL_TABLET | Freq: Every day | ORAL | Status: DC

## 2022-08-30 MED ORDER — FOLIC ACID 1 MG PO TABS
1.0000 mg | ORAL_TABLET | Freq: Every day | ORAL | Status: DC
  Administered 2022-08-30 – 2022-09-02 (×4): 1 mg via ORAL
  Filled 2022-08-30 (×4): qty 1

## 2022-08-30 MED ORDER — HYDROMORPHONE HCL 1 MG/ML IJ SOLN
0.5000 mg | INTRAMUSCULAR | Status: DC | PRN
  Administered 2022-08-30 – 2022-09-01 (×13): 0.5 mg via INTRAVENOUS
  Filled 2022-08-30 (×14): qty 0.5

## 2022-08-30 MED ORDER — THIAMINE MONONITRATE 100 MG PO TABS
100.0000 mg | ORAL_TABLET | Freq: Every day | ORAL | Status: DC
  Administered 2022-08-30 – 2022-09-02 (×4): 100 mg via ORAL
  Filled 2022-08-30 (×4): qty 1

## 2022-08-30 MED ORDER — LORAZEPAM 2 MG/ML IJ SOLN
1.0000 mg | INTRAMUSCULAR | Status: DC | PRN
  Administered 2022-08-31: 2 mg via INTRAVENOUS
  Administered 2022-09-01: 1 mg via INTRAVENOUS
  Administered 2022-09-01 (×2): 2 mg via INTRAVENOUS
  Filled 2022-08-30 (×4): qty 1

## 2022-08-30 MED ORDER — METOPROLOL TARTRATE 50 MG PO TABS
50.0000 mg | ORAL_TABLET | Freq: Two times a day (BID) | ORAL | Status: DC
  Administered 2022-08-30 – 2022-09-02 (×8): 50 mg via ORAL
  Filled 2022-08-30 (×8): qty 1

## 2022-08-30 MED ORDER — ADULT MULTIVITAMIN W/MINERALS CH
1.0000 | ORAL_TABLET | Freq: Every day | ORAL | Status: DC
  Administered 2022-08-30 – 2022-09-02 (×4): 1 via ORAL
  Filled 2022-08-30 (×4): qty 1

## 2022-08-30 MED ORDER — ENOXAPARIN SODIUM 40 MG/0.4ML IJ SOSY: 40.0000 mg | PREFILLED_SYRINGE | INTRAMUSCULAR | Status: DC

## 2022-08-30 MED ORDER — ONDANSETRON HCL 4 MG PO TABS: 4.0000 mg | ORAL_TABLET | Freq: Four times a day (QID) | ORAL | Status: DC | PRN

## 2022-08-30 MED ORDER — SODIUM CHLORIDE 0.9% FLUSH
3.0000 mL | Freq: Two times a day (BID) | INTRAVENOUS | Status: DC
  Administered 2022-08-30 – 2022-09-01 (×4): 3 mL via INTRAVENOUS
  Administered 2022-09-01: 10 mL via INTRAVENOUS

## 2022-08-30 MED ORDER — PANTOPRAZOLE SODIUM 40 MG IV SOLR
40.0000 mg | Freq: Every day | INTRAVENOUS | Status: DC
  Administered 2022-08-30 – 2022-08-31 (×2): 40 mg via INTRAVENOUS
  Filled 2022-08-30 (×3): qty 10

## 2022-08-30 MED ORDER — POTASSIUM CHLORIDE 2 MEQ/ML IV SOLN
INTRAVENOUS | Status: AC
  Filled 2022-08-30 (×6): qty 1000

## 2022-08-30 MED ORDER — LACTATED RINGERS IV SOLN: INTRAVENOUS | Status: DC

## 2022-08-30 MED ORDER — IOHEXOL 300 MG/ML  SOLN
100.0000 mL | Freq: Once | INTRAMUSCULAR | Status: AC | PRN
  Administered 2022-08-30: 100 mL via INTRAVENOUS

## 2022-08-30 MED ORDER — HYDROCHLOROTHIAZIDE 12.5 MG PO TABS
12.5000 mg | ORAL_TABLET | Freq: Every day | ORAL | Status: DC
  Administered 2022-08-30 – 2022-08-31 (×2): 12.5 mg via ORAL
  Filled 2022-08-30 (×3): qty 1

## 2022-08-30 MED ORDER — IRBESARTAN 150 MG PO TABS
300.0000 mg | ORAL_TABLET | Freq: Every day | ORAL | Status: DC
  Administered 2022-08-30 – 2022-08-31 (×2): 300 mg via ORAL
  Filled 2022-08-30 (×3): qty 2

## 2022-08-30 MED ORDER — LACTATED RINGERS IV BOLUS
1000.0000 mL | Freq: Once | INTRAVENOUS | Status: AC
  Administered 2022-08-30: 1000 mL via INTRAVENOUS

## 2022-08-30 MED ORDER — LORAZEPAM 1 MG PO TABS
1.0000 mg | ORAL_TABLET | ORAL | Status: DC | PRN
  Administered 2022-08-30 – 2022-09-01 (×7): 1 mg via ORAL
  Filled 2022-08-30 (×7): qty 1

## 2022-08-30 MED ORDER — POLYETHYLENE GLYCOL 3350 17 G PO PACK: 17.0000 g | PACK | Freq: Every day | ORAL | Status: DC | PRN

## 2022-08-30 MED ORDER — POTASSIUM CHLORIDE 10 MEQ/100ML IV SOLN
10.0000 meq | INTRAVENOUS | Status: DC
  Administered 2022-08-30 (×2): 10 meq via INTRAVENOUS
  Filled 2022-08-30 (×2): qty 100

## 2022-08-30 MED ORDER — THIAMINE HCL 100 MG/ML IJ SOLN: 100.0000 mg | Freq: Every day | INTRAMUSCULAR | Status: DC

## 2022-08-30 MED ORDER — LORAZEPAM 2 MG/ML IJ SOLN
1.0000 mg | Freq: Once | INTRAMUSCULAR | Status: AC
  Administered 2022-08-30: 1 mg via INTRAVENOUS
  Filled 2022-08-30: qty 1

## 2022-08-30 MED ORDER — HYDROMORPHONE HCL 1 MG/ML IJ SOLN
1.0000 mg | Freq: Once | INTRAMUSCULAR | Status: AC
  Administered 2022-08-30: 1 mg via INTRAVENOUS
  Filled 2022-08-30: qty 1

## 2022-08-30 MED ORDER — ONDANSETRON HCL 4 MG/2ML IJ SOLN: 4.0000 mg | Freq: Four times a day (QID) | INTRAMUSCULAR | Status: DC | PRN

## 2022-08-30 MED ORDER — CLONIDINE HCL 0.2 MG PO TABS
0.2000 mg | ORAL_TABLET | Freq: Two times a day (BID) | ORAL | Status: DC
  Administered 2022-08-30 – 2022-09-01 (×6): 0.2 mg via ORAL
  Filled 2022-08-30 (×6): qty 1

## 2022-08-30 MED ORDER — POTASSIUM CHLORIDE 10 MEQ/100ML IV SOLN
10.0000 meq | INTRAVENOUS | Status: AC
  Administered 2022-08-30 (×6): 10 meq via INTRAVENOUS
  Filled 2022-08-30 (×6): qty 100

## 2022-08-30 NOTE — ED Notes (Signed)
Patient transported to CT 

## 2022-08-30 NOTE — Assessment & Plan Note (Addendum)
-  per [facility] protocol -Will monitor closely for withdrawals

## 2022-08-30 NOTE — Assessment & Plan Note (Addendum)
-  Pressure has stabilized 135/84 today POA: BP 211/172 -Much improved  - Patient was not taking her medications for the past 2 days while she was drinking a lot of alcohol.  On multiple medications at home which include clonidine, irbesartan, HCTZ and metoprolol. -Continue home meds

## 2022-08-30 NOTE — ED Notes (Signed)
Date and time results received: 08/30/22 0108   Test: K+ Critical Value: 2.7  Name of Provider Notified: Pilar Plate, MD

## 2022-08-30 NOTE — Progress Notes (Signed)
PROGRESS NOTE    Patient: Veronica Calhoun                            PCP: Rica Records, FNP                    DOB: [birth year]            DOA: 08/30/2022 [id number]             DOS: 08/30/2022, 1:22 PM   LOS: 0 days   Date of Service: The patient was seen and examined on 08/30/2022  Subjective:   The patient was seen and examined this morning. Hemodynamically stable. No issues overnight .  Brief Narrative:    Veronica Calhoun is a 37 y.o. female with medical history significant of alcohol abuse, recurrent pancreatitis and hypertension came to ED with sudden onset abdominal pain along with nausea and vomiting started few hours before coming to ED.   Patient was binge drinking for the past 2 days and was not taking her medications.   Patient denies any recent illnesses, no upper respiratory symptoms.  She was having epigastric and right upper quadrant pain.  No diarrhea.  No urinary symptoms.  No fever or chills.   ED course: Blood pressure elevated at 211/134, afebrile.  Labs pertinent for lipase at 74 and potassium of 2.7.  AST 100, ALT 59, alkaline phosphatase 216 and T. bili of 2.1. Pending CT abdomen and RUQ ultrasound.    Assessment & Plan:   Principal Problem:   Acute alcoholic pancreatitis Active Problems:   Essential hypertension   Alcohol use disorder, severe, dependence (HCC)   GERD without esophagitis   Transaminitis   Tobacco use disorder   Hypokalemia     Assessment and Plan: * Acute alcoholic pancreatitis -Improved abdominal pain -Abdominal pain in the setting of acute alcohol induced pancreatitis -N.p.o. -IV fluids -Monitoring lipase, and LFTs -Avoiding hepatotoxins  Patient was binge drinking for the past 2 days.  -CT abdominal --changes consistent with acute pancreatitis, negative for any other significant findings -Continue supportive care -Bleeding thiamine, folate, -Pain management-IV Dilaudid ordered.  Essential  hypertension POA: BP 211/172 -Much improved Current vitals :blood pressure 130/81, pulse 76, temperature 98.6 F (37 C), on room air SpO2 99 %.   - Patient was not taking her medications for the past 2 days while she was drinking a lot of alcohol.  On multiple medications at home which include clonidine, irbesartan, HCTZ and metoprolol. -Continue home meds -Check renin and aldosterone levels  Alcohol use disorder, severe, dependence (HCC) -per [facility] protocol -Will monitor closely for withdrawals  GERD without esophagitis -IV Protonix  Transaminitis Elevated liver enzymes and T. bili.  Patient also has an history of alcoholic hepatitis. -RUQ ultrasound -Check hepatitis panel -Supportive care    Latest Ref Rng & Units 08/30/2022    6:02 AM 08/30/2022   12:15 AM 07/25/2022    9:14 AM  Hepatic Function  Total Protein 6.5 - 8.1 g/dL 7.1  8.5  7.2   Albumin 3.5 - 5.0 g/dL 3.0  3.5  3.5   AST 15 - 41 U/L 75  100  33   ALT 0 - 44 U/L 47  59  17   Alk Phosphatase 38 - 126 U/L 173  216  132   Total Bilirubin 0.3 - 1.2 mg/dL 2.0  2.1  1.5      Tobacco use  disorder -Nicotine patch -Patient has been counseled regarding cessation of tobacco use and abuse  Hypokalemia Potassium at 2.7.  Patient has an history of hypokalemia.   -Likely due to GI loss, repleting potassium, with magnesium     --------------------------------------------------------------------------------------------------------------------------- Nutritional status:  The patient's BMI is: Body mass index is 34.7 kg/m. I agree with the assessment and plan as outlined ----------------------------------------------------------------------------------------------------------------------  DVT prophylaxis:     Code Status:   Code Status: Full Code  Family Communication: No family member present at bedside- attempt will be made to update daily The above findings and plan of care has been discussed with patient  (and family)  in detail,  they expressed understanding and agreement of above. -Advance care planning has been discussed.   Admission status:   Status is: Observation The patient remains OBS appropriate and will d/c before 2 midnights.   Disposition: From  - home             Planning for discharge in 1-2 days: to   Procedures:   No admission procedures for hospital encounter.   Antimicrobials:  Anti-infectives (From admission, onward)    None        Medication:   cloNIDine  0.2 mg Oral BID   folic acid  1 mg Oral Daily   irbesartan  300 mg Oral Daily   And   hydrochlorothiazide  12.5 mg Oral Daily   metoprolol tartrate  50 mg Oral BID   multivitamin with minerals  1 tablet Oral Daily   nicotine  14 mg Transdermal Daily   pantoprazole (PROTONIX) IV  40 mg Intravenous Daily   sodium chloride flush  3 mL Intravenous Q12H   thiamine  100 mg Oral Daily   Or   thiamine  100 mg Intravenous Daily    HYDROmorphone (DILAUDID) injection, LORazepam **OR** LORazepam, ondansetron **OR** ondansetron (ZOFRAN) IV, polyethylene glycol   Objective:   Vitals:   08/30/22 1030 08/30/22 1100 08/30/22 1119 08/30/22 1230  BP: (!) 143/99 (!) 144/102  130/81  Pulse: 80 78  76  Resp:    18  Temp:   98.6 F (37 C)   TempSrc:   Oral   SpO2: 95% 96%  99%  Weight:      Height:        Intake/Output Summary (Last 24 hours) at 08/30/2022 1322 Last data filed at 08/30/2022 0865 Gross per 24 hour  Intake 1500 ml  Output --  Net 1500 ml   Filed Weights   08/29/22 2347  Weight: 97.5 kg     Physical examination:   Constitution:  Alert, cooperative, no distress,  Appears calm and comfortable  Psychiatric:   Normal and stable mood and affect, cognition intact,   HEENT:        Normocephalic, PERRL, otherwise with in Normal limits  Chest:         Chest symmetric Cardio vascular:  S1/S2, RRR, No murmure, No Rubs or Gallops  pulmonary: Clear to auscultation bilaterally, respirations  unlabored, negative wheezes / crackles Abdomen: Soft, non-tender, non-distended, bowel sounds,no masses, no organomegaly Muscular skeletal: Limited exam - in bed, able to move all 4 extremities,   Neuro: CNII-XII intact. , normal motor and sensation, reflexes intact  Extremities: No pitting edema lower extremities, +2 pulses  Skin: Dry, warm to touch, negative for any Rashes, No open wounds Wounds: per nursing documentation   ------------------------------------------------------------------------------------------------------------------------------------------    LABs:     Latest Ref Rng & Units 08/30/2022  6:02 AM 08/30/2022   12:15 AM 07/25/2022    9:14 AM  CBC  WBC 4.0 - 10.5 K/uL 9.7  9.8  6.7   Hemoglobin 12.0 - 15.0 g/dL 16.1  09.6  04.5   Hematocrit 36.0 - 46.0 % 39.5  44.9  34.0   Platelets 150 - 400 K/uL 262  305  334       Latest Ref Rng & Units 08/30/2022    6:02 AM 08/30/2022   12:15 AM 07/25/2022    9:14 AM  CMP  Glucose 70 - 99 mg/dL 409  811  97   BUN 6 - 20 mg/dL <5  <5  2   Creatinine 0.44 - 1.00 mg/dL 9.14  7.82  9.56   Sodium 135 - 145 mmol/L 135  136  140   Potassium 3.5 - 5.1 mmol/L 3.0  2.7  3.4   Chloride 98 - 111 mmol/L 101  99  104   CO2 22 - 32 mmol/L 26  26  21    Calcium 8.9 - 10.3 mg/dL 8.5  9.0  9.3   Total Protein 6.5 - 8.1 g/dL 7.1  8.5  7.2   Total Bilirubin 0.3 - 1.2 mg/dL 2.0  2.1  1.5   Alkaline Phos 38 - 126 U/L 173  216  132   AST 15 - 41 U/L 75  100  33   ALT 0 - 44 U/L 47  59  17        Micro Results No results found for this or any previous visit (from the past 240 hour(s)).  Radiology Reports US Abdomen Limited RUQ (LIVER/GB)  Result Date: 08/30/2022 CLINICAL DATA:  Right upper quadrant/epigastric pain for 3 days. EXAM: ULTRASOUND ABDOMEN LIMITED RIGHT UPPER QUADRANT COMPARISON:  CT AP 08/30/2022 FINDINGS: Gallbladder: No gallstones or wall thickening visualized. No sonographic Murphy sign noted by sonographer. Common bile  duct: Diameter: 3 mm Liver: Diffuse increased parenchymal echogenicity compatible with hepatic steatosis. No focal liver abnormality. Portal vein is patent on color Doppler imaging with normal direction of blood flow towards the liver. Other: None. IMPRESSION: 1. No acute findings. 2. Hepatic steatosis. Electronically Signed   By: Signa Kell M.D.   On: 08/30/2022 09:10   CT ABDOMEN PELVIS W CONTRAST  Result Date: 08/30/2022 CLINICAL DATA:  Abdominal pain, possible pancreatitis. EXAM: CT ABDOMEN AND PELVIS WITH CONTRAST TECHNIQUE: Multidetector CT imaging of the abdomen and pelvis was performed using the standard protocol following bolus administration of intravenous contrast. RADIATION DOSE REDUCTION: This exam was performed according to the departmental dose-optimization program which includes automated exposure control, adjustment of the mA and/or kV according to patient size and/or use of iterative reconstruction technique. CONTRAST:  OMNIPAQUE IOHEXOL 300 MG/ML  SOLN COMPARISON:  07/10/2022. FINDINGS: Lower chest: No acute abnormality. Hepatobiliary: No focal abnormality in the liver. Diffuse fatty infiltration is noted. No biliary ductal dilatation. The gallbladder is without stones. Pancreas: Peripancreatic fat stranding is noted. No peripancreatic ductal dilatation is seen. A fluid collection is noted in the pancreatic tail measuring up to 2.8 cm. Few smaller collections are noted in the left upper quadrant in the perisplenic space. Spleen: Normal in size without focal abnormality. Adrenals/Urinary Tract: The adrenal glands are within normal limits. The kidneys enhance symmetrically. No renal calculus or hydronephrosis. The bladder is unremarkable. Stomach/Bowel: Stomach is within normal limits. Appendix appears normal. No evidence of bowel wall thickening, distention, or inflammatory changes. No free air or pneumatosis. Vascular/Lymphatic: No significant vascular findings  are present. No  enlarged abdominal or pelvic lymph nodes. Reproductive: Uterus and bilateral adnexa are unremarkable. Other: No abdominopelvic ascites. Fat containing umbilical hernia is present. Musculoskeletal: No acute osseous abnormality. IMPRESSION: 1. Findings compatible with acute pancreatitis. Multiple fluid collections are present in the pancreatic tail and left upper quadrant, slightly decreased in size from the prior exam. 2. Hepatic steatosis. Electronically Signed   By: Thornell Sartorius M.D.   On: 08/30/2022 04:36    SIGNED: Kendell Bane, MD, FHM. FAAFP. [facility] - [facility] Time spent - 35 min.  In seeing, evaluating and examining the patient. Reviewing medical records, labs, drawn plan of care. [facility],  Pager (please use [email or web address] to page/ text) Please use Epic Secure Chat for non-urgent communication (7AM-7PM)  If 7PM-7AM, please contact night-coverage [email or web address], 08/30/2022, 1:22 PM

## 2022-08-30 NOTE — ED Provider Notes (Signed)
[facility]-EMERGENCY DEPT [facility] Emergency Department Provider Note MRN:  [id number]  Arrival date & time: 08/30/22     Chief Complaint   Abdominal Pain (Hx pancreatitis r/t alcohol )   History of Present Illness   Veronica Calhoun is a 37 y.o. year-old female with a history of alcohol use disorder, pancreatitis presenting to the ED with chief complaint of abdominal pain.  Epigastric abdominal pain over the past few days, has been drinking alcohol again.  No fever, pain is moderate to severe associated with nausea vomiting.  Review of Systems  A thorough review of systems was obtained and all systems are negative except as noted in the HPI and PMH.   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    Past Surgical History:  Procedure Laterality Date   CESAREAN SECTION  03/18/2007    Family History  Problem Relation Age of Onset   Heart murmur Father    AAA (abdominal aortic aneurysm) Sister    Urinary tract infection Daughter    Hypertension Maternal Grandmother     Social History   Socioeconomic History   Marital status: Single    Spouse name: Not on file   Number of children: 1   Years of education: Not on file   Highest education level: Some college, no degree  Occupational History   Not on file  Tobacco Use   Smoking status: Every Day    Packs/day: 0.50    Years: 10.00    Additional pack years: 0.00    Total pack years: 5.00    Types: Cigarettes   Smokeless tobacco: Never  Vaping Use   Vaping Use: Never used  Substance and Sexual  Activity   Alcohol use: Not Currently    Alcohol/week: 10.0 standard drinks of alcohol    Types: 10 Cans of beer per week   Drug use: No   Sexual activity: Yes    Partners: Male    Birth control/protection: None  Other Topics Concern   Not on file  Social History Narrative   Lives in [place] with daughter   Has long term boyfriend.   Social Determinants of Health   Financial Resource Strain: Not on file  Food Insecurity: No Food Insecurity (06/24/2022)   Hunger Vital Sign    Worried About Running Out of Food in the Last Year: Never true    Ran Out of Food in the Last Year: Never true  Transportation Needs: No Transportation Needs (06/24/2022)   PRAPARE - Administrator, Civil Service (Medical): No    Lack of Transportation (Non-Medical): No  Physical Activity: Inactive (09/02/2017)   Exercise Vital Sign    Days of Exercise per Week: 0 days    Minutes of Exercise per Session: 0 min  Stress: Stress Concern Present (09/02/2017)   [employer] of Occupational Health - Occupational Stress Questionnaire    Feeling of Stress : To some extent  Social Connections: Not on file  Intimate Partner Violence: Not At Risk (06/24/2022)   Humiliation, Afraid, Rape, and Kick questionnaire    Fear  of Current or Ex-Partner: No    Emotionally Abused: No    Physically Abused: No    Sexually Abused: No     Physical Exam   Vitals:   08/30/22 0300 08/30/22 0330  BP: (!) 200/119 (!) 202/122  Pulse: 93 94  Resp: 18 18  Temp:  99 F (37.2 C)  SpO2: 99% 96%    CONSTITUTIONAL: Well-appearing, NAD NEURO/PSYCH:  Alert and oriented x 3, no focal deficits EYES:  eyes equal and reactive ENT/NECK:  no LAD, no JVD CARDIO: Regular rate, well-perfused, normal S1 and S2 PULM:  CTAB no wheezing or rhonchi GI/GU:  non-distended, moderate epigastric tenderness palpation MSK/SPINE:  No gross deformities, no edema SKIN:  no rash, atraumatic   *Additional and/or pertinent findings  included in MDM below  Diagnostic and Interventional Summary    EKG Interpretation  Date/Time:    Ventricular Rate:    PR Interval:    QRS Duration:   QT Interval:    QTC Calculation:   R Axis:     Text Interpretation:         Labs Reviewed  LIPASE, BLOOD - Abnormal; Notable for the following components:      Result Value   Lipase 74 (*)    All other components within normal limits  COMPREHENSIVE METABOLIC PANEL - Abnormal; Notable for the following components:   Potassium 2.7 (*)    Glucose, Bld 118 (*)    BUN <5 (*)    Total Protein 8.5 (*)    AST 100 (*)    ALT 59 (*)    Alkaline Phosphatase 216 (*)    Total Bilirubin 2.1 (*)    All other components within normal limits  CBC - Abnormal; Notable for the following components:   Hemoglobin 16.1 (*)    RDW 19.6 (*)    All other components within normal limits  URINALYSIS, ROUTINE W REFLEX MICROSCOPIC - Abnormal; Notable for the following components:   Color, Urine AMBER (*)    APPearance HAZY (*)    Bilirubin Urine SMALL (*)    Ketones, ur 5 (*)    Protein, ur >=300 (*)    Bacteria, UA RARE (*)    All other components within normal limits  CBC - Abnormal; Notable for the following components:   RDW 19.0 (*)    All other components within normal limits  MAGNESIUM  HEPATITIS PANEL, ACUTE  COMPREHENSIVE METABOLIC PANEL  ALDOSTERONE + RENIN ACTIVITY W/ RATIO  POC URINE PREG, ED    CT ABDOMEN PELVIS W CONTRAST  Final Result    US Abdomen Limited RUQ (LIVER/GB)    (Results Pending)    Medications  potassium chloride 10 mEq in 100 mL IVPB (10 mEq Intravenous New Bag/Given 08/30/22 0556)  cloNIDine (CATAPRES) tablet 0.2 mg (0.2 mg Oral Given 08/30/22 0347)  metoprolol tartrate (LOPRESSOR) tablet 50 mg (50 mg Oral Given 08/30/22 0347)  pantoprazole (PROTONIX) injection 40 mg (40 mg Intravenous Given 08/30/22 0347)  LORazepam (ATIVAN) tablet 1-4 mg (has no administration in time range)    Or  LORazepam (ATIVAN)  injection 1-4 mg (has no administration in time range)  thiamine (VITAMIN B1) tablet 100 mg (has no administration in time range)    Or  thiamine (VITAMIN B1) injection 100 mg (has no administration in time range)  folic acid (FOLVITE) tablet 1 mg (has no administration in time range)  multivitamin with minerals tablet 1 tablet (has no administration in time range)  HYDROmorphone (DILAUDID) injection  0.5 mg (0.5 mg Intravenous Given 08/30/22 0557)  irbesartan (AVAPRO) tablet 300 mg (has no administration in time range)    And  hydrochlorothiazide (HYDRODIURIL) tablet 12.5 mg (has no administration in time range)  enoxaparin (LOVENOX) injection 40 mg (has no administration in time range)  sodium chloride flush (NS) 0.9 % injection 3 mL (3 mLs Intravenous Not Given 08/30/22 0337)  lactated ringers infusion ( Intravenous New Bag/Given 08/30/22 0343)  polyethylene glycol (MIRALAX / GLYCOLAX) packet 17 g (has no administration in time range)  ondansetron (ZOFRAN) tablet 4 mg (has no administration in time range)    Or  ondansetron (ZOFRAN) injection 4 mg (has no administration in time range)  nicotine (NICODERM CQ - dosed in mg/24 hours) patch 14 mg (14 mg Transdermal Patch Applied 08/30/22 0556)  lactated ringers bolus 1,000 mL (0 mLs Intravenous Stopped 08/30/22 0343)  HYDROmorphone (DILAUDID) injection 1 mg (1 mg Intravenous Given 08/30/22 0154)  LORazepam (ATIVAN) injection 1 mg (1 mg Intravenous Given 08/30/22 0318)  iohexol (OMNIPAQUE) 300 MG/ML solution 100 mL (100 mLs Intravenous Contrast Given 08/30/22 0356)     Procedures  /  Critical Care Procedures  ED Course and Medical Decision Making  Initial Impression and Ddx Suspect recurrence of pancreatitis given the history tenderness but no signs of peritonitis on exam.  Suspect component of dehydration given the tachycardia and poor p.o. intake.  Providing symptomatic control, fluids, will reassess.  Past medical/surgical history that  increases complexity of ED encounter: Alcohol use disorder, pancreatitis  Interpretation of Diagnostics I personally reviewed the laboratory assessment and my interpretation is as follows: Mild lipase elevation, hypokalemia    Patient Reassessment and Ultimate Disposition/Management     Continued pain, also starting to have alcohol withdrawal, will admit to medicine.  Patient management required discussion with the following services or consulting groups:  Hospitalist Service  Complexity of Problems Addressed Acute illness or injury that poses threat of life of bodily function  Additional Data Reviewed and Analyzed Further history obtained from: Prior labs/imaging results  Additional Factors Impacting ED Encounter Risk Consideration of hospitalization  Elmer Sow. Pilar Plate, MD [facility] [email or web address]  Final Clinical Impressions(s) / ED Diagnoses     ICD-10-CM   1. Acute pancreatitis, unspecified complication status, unspecified pancreatitis type  K85.90       ED Discharge Orders     None        Discharge Instructions Discussed with and Provided to Patient:   Discharge Instructions   None      Sabas Sous, MD 08/30/22 [phone]

## 2022-08-30 NOTE — Assessment & Plan Note (Addendum)
Potassium at 2.7.>>3.2    Patient has an history of hypokalemia.   -Likely due to GI loss, repleting potassium, with magnesium

## 2022-08-30 NOTE — Assessment & Plan Note (Addendum)
Elevated liver enzymes and T. bili.  Patient also has an history of alcoholic hepatitis. -RUQ ultrasound -Viral hepatitis panel >>> all nonreactive -Supportive care    Latest Ref Rng & Units 08/31/2022    4:43 AM 08/30/2022    6:02 AM 08/30/2022   12:15 AM  Hepatic Function  Total Protein 6.5 - 8.1 g/dL 6.3  7.1  8.5   Albumin 3.5 - 5.0 g/dL 2.7  3.0  3.5   AST 15 - 41 U/L 53  75  100   ALT 0 - 44 U/L 36  47  59   Alk Phosphatase 38 - 126 U/L 135  173  216   Total Bilirubin 0.3 - 1.2 mg/dL 1.7  2.0  2.1

## 2022-08-30 NOTE — Assessment & Plan Note (Addendum)
-  Still complaining abdominal pain, improved with IV analgesics -Abdominal pain in the setting of acute alcohol induced pancreatitis --Tolerating clear liquid diet -IV fluid with LR -Monitoring lipase, and LFTs -Avoiding hepatotoxins  Patient was binge drinking for the past 2 days.  -CT abdominal --changes consistent with acute pancreatitis, negative for any other significant findings -Continue supportive care -Bleeding thiamine, folate, -Pain management-IV Dilaudid ordered.

## 2022-08-30 NOTE — H&P (Signed)
History and Physical    Patient: Veronica Calhoun ZOX:[id number] DOB: 1985-11-25 DOA: 08/30/2022 DOS: the patient was seen and examined on 08/30/2022 PCP: Rica Records, FNP  Patient coming from: Home  Chief Complaint:  Chief Complaint  Patient presents with   Abdominal Pain    Hx pancreatitis r/t alcohol    HPI: Veronica Calhoun is a 37 y.o. female with medical history significant of alcohol abuse, recurrent pancreatitis and hypertension came to ED with sudden onset abdominal pain along with nausea and vomiting started few hours before coming to ED.  Patient was binge drinking for the past 2 days and was not taking her medications.  Patient denies any recent illnesses, no upper respiratory symptoms.  She was having epigastric and right upper quadrant pain.  No diarrhea.  No urinary symptoms.  No fever or chills.  ED course: Blood pressure elevated at 211/134, afebrile.  Labs pertinent for lipase at 74 and potassium of 2.7.  AST 100, ALT 59, alkaline phosphatase 216 and T. bili of 2.1. Pending CT abdomen and RUQ ultrasound.  Patient is being admitted for concern of alcohol induced pancreatitis.  Review of Systems: As mentioned in the history of present illness. All other systems reviewed and are negative. Past Medical History:  Diagnosis Date   Acute pancreatitis 12/29/2021   Alcohol abuse 06/24/2022   Alcohol induced acute pancreatitis 06/10/2022   Alcohol use disorder, severe, dependence (HCC) 05/20/2018   Alcoholic hepatitis without ascites 06/19/2022   Anasarca 06/20/2022   Anxiety 2013   Generalized anxiety disorder 04/01/2013   History of hypertension 11/15/2021   Hypertension 2014   Pancreatitis    Warts, genital 07/21/2019   Past Surgical History:  Procedure Laterality Date   CESAREAN SECTION  03/18/2007   Social History:  reports that she has been smoking cigarettes. She has a 5.00 pack-year smoking history. She has never used smokeless tobacco. She  reports that she does not currently use alcohol after a past usage of about 10.0 standard drinks of alcohol per week. She reports that she does not use drugs.  Allergies  Allergen Reactions   Lisinopril Swelling    Reports Lip swelling   Amlodipine Other (See Comments)    "like I was going to pass out"    Family History  Problem Relation Age of Onset   Heart murmur Father    AAA (abdominal aortic aneurysm) Sister    Urinary tract infection Daughter    Hypertension Maternal Grandmother     Prior to Admission medications   Medication Sig Start Date End Date Taking? Authorizing Provider  albuterol (VENTOLIN HFA) 108 (90 Base) MCG/ACT inhaler Inhale 2 puffs into the lungs every 6 (six) hours as needed for wheezing or shortness of breath. 06/23/22   Sherryll Burger, Pratik D, DO  cloNIDine (CATAPRES) 0.2 MG tablet Take 1 tablet (0.2 mg total) by mouth 2 (two) times daily. 06/23/22 08/22/22  Sherryll Burger, Pratik D, DO  HYDROcodone-acetaminophen (NORCO/VICODIN) 5-325 MG tablet Take 1 tablet by mouth every 6 (six) hours as needed. 07/10/22   Bethann Berkshire, MD  metoprolol tartrate (LOPRESSOR) 50 MG tablet Take 1 tablet (50 mg total) by mouth 2 (two) times daily. 06/23/22 08/22/22  Sherryll Burger, Pratik D, DO  olmesartan-hydrochlorothiazide (BENICAR HCT) 40-12.5 MG tablet Take 1 tablet by mouth daily. 07/25/22   Del Nigel Berthold, FNP  ondansetron (ZOFRAN-ODT) 4 MG disintegrating tablet 4mg  ODT q4 hours prn nausea/vomit 07/10/22   Bethann Berkshire, MD  oxycodone (OXY-IR) 5 MG capsule  Take 1 capsule (5 mg total) by mouth every 6 (six) hours as needed. 06/26/22   Airlie Blumenberg, Loura Halt, MD  pantoprazole (PROTONIX) 40 MG tablet Take 1 tablet (40 mg total) by mouth daily. 06/23/22 06/23/23  Sherryll Burger, Pratik D, DO  polyethylene glycol (MIRALAX) 17 g packet Take 17 g by mouth daily as needed for mild constipation, moderate constipation or severe constipation. 06/23/22   Sherryll Burger, Pratik D, DO  potassium chloride SA (KLOR-CON M) 20 MEQ tablet Take 1  tablet (20 mEq total) by mouth daily. 07/10/22   Bethann Berkshire, MD    Physical Exam: Vitals:   08/29/22 2349 08/30/22 0130 08/30/22 0200 08/30/22 0300  BP: (!) 161/134 (!) 211/172 (!) 211/146 (!) 200/119  Pulse:  (!) 104 (!) 106 93  Resp:  18 18 18   Temp:      TempSrc:      SpO2:  100% 99% 99%  Weight:      Height:        General: Vital signs reviewed.  Patient is well-developed and well-nourished, in no acute distress and cooperative with exam.  Head: Normocephalic and atraumatic. Eyes: EOMI, conjunctivae normal, no scleral icterus.  Neck: Supple, trachea midline, normal ROM, no JVD, masses, thyromegaly, or carotid bruit present.  Cardiovascular: RRR, S1 normal, S2 normal, no murmurs, gallops, or rubs. Pulmonary/Chest: Clear to auscultation bilaterally, no wheezes, rales, or rhonchi. Abdominal: Soft, tenderness along RUQ and epigastrium, non-distended, BS +,  Extremities: No lower extremity edema bilaterally,  pulses symmetric and intact bilaterally. No cyanosis or clubbing. Neurological: A&O x3, Strength is normal and symmetric bilaterally, cranial nerve II-XII are grossly intact, no focal motor deficit, sensory intact to light touch bilaterally.  Psychiatric: Normal mood and affect.   Data Reviewed: Prior data reviewed as mentioned above  Assessment and Plan: * Acute alcoholic pancreatitis Patient has history of alcohol abuse.  Lipase elevated. History of prior alcohol induced pancreatitis.  Elevated liver enzymes Patient was binge drinking for the past 2 days. -Admit to MedSurg -CT abdominal  -Keep n.p.o. -Supportive care -Pain management-IV Dilaudid ordered.  Essential hypertension Significantly elevated blood pressure.  Patient was not taking her medications for the past 2 days while she was drinking a lot of alcohol. She was also having hypokalemia. On multiple medications at home which include clonidine, irbesartan, HCTZ and metoprolol. -Continue home meds -Check  renin and aldosterone levels  Alcohol use disorder, severe, dependence (HCC) -CIWA protocol  GERD without esophagitis -IV Protonix  Transaminitis Elevated liver enzymes and T. bili.  Patient also has an history of alcoholic hepatitis. -RUQ ultrasound -Check hepatitis panel -Supportive care  Tobacco use disorder -Nicotine patch  Hypokalemia Potassium at 2.7.  Patient has an history of hypokalemia.  Might be due to some GI losses.  Significantly elevated blood pressure. -Check magnesium -Check renal and aldosterone levels -Replace potassium and monitor   Advance Care Planning:   Code Status: Full Code   Consults: None  Family Communication: No family at bedside  Severity of Illness: The appropriate patient status for this patient is OBSERVATION. Observation status is judged to be reasonable and necessary in order to provide the required intensity of service to ensure the patient's safety. The patient's presenting symptoms, physical exam findings, and initial radiographic and laboratory data in the context of their medical condition is felt to place them at decreased risk for further clinical deterioration. Furthermore, it is anticipated that the patient will be medically stable for discharge from the hospital within 2 midnights of  admission.   This record has been created using Conservation officer, historic buildings. Errors have been sought and corrected,but may not always be located. Such creation errors do not reflect on the standard of care.   Author: Arnetha Courser, MD 08/30/2022 3:48 AM  For on call review [email or web address].

## 2022-08-30 NOTE — TOC Initial Note (Signed)
Transition of Care [facility]) - Initial/Assessment Note    Patient Details  Name: Veronica Calhoun MRN: [id number] Date of Birth: 05-01-1986  Transition of Care [facility]) CM/SW Contact:    Catalina Gravel, LCSW Phone Number: 08/30/2022, 3:21 PM  Clinical Narrative:                 .. Transition of Care (TOC) Screening Note   Patient Details  Name: Veronica Calhoun Date of Birth: 1986-01-03   Transition of Care [facility]) CM/SW Contact:    Catalina Gravel, LCSW Phone Number: 08/30/2022, 3:21 PM    Transition of Care Department [facility]) has reviewed patient and no TOC needs have been identified at this time. We will continue to monitor patient advancement through interdisciplinary progression rounds. If new patient transition needs arise, please place a TOC consult.      Barriers to Discharge: Continued Medical Work up   Patient Goals and CMS Choice            Expected Discharge Plan and Services                                              Prior Living Arrangements/Services                       Activities of Daily Living      Permission Sought/Granted                  Emotional Assessment              Admission diagnosis:  Acute alcoholic pancreatitis [K85.20] Patient Active Problem List   Diagnosis Date Noted   Acute alcoholic pancreatitis 08/30/2022   GERD without esophagitis 06/24/2022   Alcohol abuse 06/24/2022   Elevated LFTs 06/24/2022   Hypoalbuminemia 06/24/2022   Sepsis due to pneumonia (HCC) 06/23/2022   Anasarca 06/20/2022   Alcoholic hepatitis without ascites 06/19/2022   Alcoholic hepatitis with ascites 06/16/2022   Abdominal pain, epigastric 06/16/2022   Ileus (HCC) 06/16/2022   Tobacco use disorder 06/11/2022   Tachycardia 06/11/2022   Hypokalemia 06/11/2022   Hypertensive crisis 06/10/2022   Transaminitis 06/10/2022   Obesity, Class III, BMI 40-49.9 (morbid obesity) (HCC) 12/29/2021   Pancreatitis  12/28/2021   Leukocytosis 12/28/2021   Abnormal cytology smear of cervix 11/15/2021   History of hypertension 11/15/2021   ASCUS with positive high risk HPV cervical 07/21/2019   Warts, genital 07/21/2019   Depression 04/15/2019   Alcohol use disorder, severe, dependence (HCC) 05/20/2018   Panic attacks 04/01/2013   Essential hypertension 05/05/2012   Anxiety 05/06/2011   PCP:  Rica Records, FNP Pharmacy:   [facility] [phone] - [place] - [address] AT [facility] OF [address] [place] [address] Phone: [phone] Fax: [phone]     Social Determinants of Health (SDOH) Social History: SDOH Screenings   Food Insecurity: No Food Insecurity (06/24/2022)  Housing: Low Risk  (06/24/2022)  Transportation Needs: No Transportation Needs (06/24/2022)  Utilities: Not At Risk (06/24/2022)  Depression (PHQ2-9): Low Risk  (07/25/2022)  Physical Activity: Inactive (09/02/2017)  Stress: Stress Concern Present (09/02/2017)  Tobacco Use: High Risk (08/29/2022)   SDOH Interventions:     Readmission Risk Interventions     No data to display

## 2022-08-30 NOTE — Assessment & Plan Note (Signed)
-  IV Protonix 

## 2022-08-30 NOTE — Assessment & Plan Note (Addendum)
-  Nicotine patch -Patient has been counseled regarding cessation of tobacco use and abuse

## 2022-08-30 NOTE — Hospital Course (Signed)
Veronica Calhoun is a 37 y.o. female with medical history significant of alcohol abuse, recurrent pancreatitis and hypertension came to ED with sudden onset abdominal pain along with nausea and vomiting started few hours before coming to ED.   Patient was binge drinking for the past 2 days and was not taking her medications.   Patient denies any recent illnesses, no upper respiratory symptoms.  She was having epigastric and right upper quadrant pain.  No diarrhea.  No urinary symptoms.  No fever or chills.   ED course: Blood pressure elevated at 211/134, afebrile.  Labs pertinent for lipase at 74 and potassium of 2.7.  AST 100, ALT 59, alkaline phosphatase 216 and T. bili of 2.1. Pending CT abdomen and RUQ ultrasound.

## 2022-08-31 DIAGNOSIS — F1020 Alcohol dependence, uncomplicated: Secondary | ICD-10-CM | POA: Diagnosis present

## 2022-08-31 DIAGNOSIS — K852 Alcohol induced acute pancreatitis without necrosis or infection: Secondary | ICD-10-CM | POA: Diagnosis present

## 2022-08-31 DIAGNOSIS — Z79899 Other long term (current) drug therapy: Secondary | ICD-10-CM | POA: Diagnosis not present

## 2022-08-31 DIAGNOSIS — Z8249 Family history of ischemic heart disease and other diseases of the circulatory system: Secondary | ICD-10-CM | POA: Diagnosis not present

## 2022-08-31 DIAGNOSIS — Z765 Malingerer [conscious simulation]: Secondary | ICD-10-CM | POA: Diagnosis not present

## 2022-08-31 DIAGNOSIS — F17210 Nicotine dependence, cigarettes, uncomplicated: Secondary | ICD-10-CM | POA: Diagnosis present

## 2022-08-31 DIAGNOSIS — K219 Gastro-esophageal reflux disease without esophagitis: Secondary | ICD-10-CM | POA: Diagnosis present

## 2022-08-31 DIAGNOSIS — K861 Other chronic pancreatitis: Secondary | ICD-10-CM | POA: Diagnosis present

## 2022-08-31 DIAGNOSIS — E876 Hypokalemia: Secondary | ICD-10-CM | POA: Diagnosis present

## 2022-08-31 DIAGNOSIS — I10 Essential (primary) hypertension: Secondary | ICD-10-CM | POA: Diagnosis present

## 2022-08-31 LAB — CBC
HCT: 37.8 % (ref 36.0–46.0)
Hemoglobin: 13 g/dL (ref 12.0–15.0)
MCH: 32.3 pg (ref 26.0–34.0)
MCHC: 34.4 g/dL (ref 30.0–36.0)
MCV: 93.8 fL (ref 80.0–100.0)
Platelets: 224 10*3/uL (ref 150–400)
RBC: 4.03 MIL/uL (ref 3.87–5.11)
RDW: 19.9 % — ABNORMAL HIGH (ref 11.5–15.5)
WBC: 9.9 10*3/uL (ref 4.0–10.5)
nRBC: 0 % (ref 0.0–0.2)

## 2022-08-31 LAB — COMPREHENSIVE METABOLIC PANEL
ALT: 36 U/L (ref 0–44)
AST: 53 U/L — ABNORMAL HIGH (ref 15–41)
Albumin: 2.7 g/dL — ABNORMAL LOW (ref 3.5–5.0)
Alkaline Phosphatase: 135 U/L — ABNORMAL HIGH (ref 38–126)
Anion gap: 7 (ref 5–15)
BUN: 6 mg/dL (ref 6–20)
CO2: 28 mmol/L (ref 22–32)
Calcium: 8.7 mg/dL — ABNORMAL LOW (ref 8.9–10.3)
Chloride: 100 mmol/L (ref 98–111)
Creatinine, Ser: 0.46 mg/dL (ref 0.44–1.00)
GFR, Estimated: >60 mL/min (ref >60)
Glucose, Bld: 86 mg/dL (ref 70–99)
Potassium: 3.2 mmol/L — ABNORMAL LOW (ref 3.5–5.1)
Sodium: 135 mmol/L (ref 135–145)
Total Bilirubin: 1.7 mg/dL — ABNORMAL HIGH (ref 0.3–1.2)
Total Protein: 6.3 g/dL — ABNORMAL LOW (ref 6.5–8.1)

## 2022-08-31 LAB — LIPASE, BLOOD: Lipase: 501 U/L — ABNORMAL HIGH (ref 11–51)

## 2022-08-31 NOTE — Progress Notes (Signed)
PROGRESS NOTE    Patient: Veronica Calhoun                            PCP: Rica Records, FNP                    DOB: Nov 30, 1985            DOA: 08/30/2022 [id number]             DOS: 08/31/2022, 11:17 AM   LOS: 0 days   Date of Service: The patient was seen and examined on 08/31/2022  Subjective:   The patient was seen and examined this morning, still complaining of abdominal pain, clear liquid diet denies any nausea or vomiting  Brief Narrative:    Veronica Calhoun is a 37 y.o. female with medical history significant of alcohol abuse, recurrent pancreatitis and hypertension came to ED with sudden onset abdominal pain along with nausea and vomiting started few hours before coming to ED.   Patient was binge drinking for the past 2 days and was not taking her medications.   Patient denies any recent illnesses, no upper respiratory symptoms.  She was having epigastric and right upper quadrant pain.  No diarrhea.  No urinary symptoms.  No fever or chills.   ED course: Blood pressure elevated at 211/134, afebrile.  Labs pertinent for lipase at 74 and potassium of 2.7.  AST 100, ALT 59, alkaline phosphatase 216 and T. bili of 2.1. Pending CT abdomen and RUQ ultrasound.    Assessment & Plan:   Principal Problem:   Acute alcoholic pancreatitis Active Problems:   Essential hypertension   Alcohol use disorder, severe, dependence (HCC)   GERD without esophagitis   Transaminitis   Tobacco use disorder   Hypokalemia     Assessment and Plan: * Acute alcoholic pancreatitis -Still complaining abdominal pain, improved with IV analgesics -Abdominal pain in the setting of acute alcohol induced pancreatitis --Tolerating clear liquid diet -IV fluid with LR -Monitoring lipase, and LFTs -Avoiding hepatotoxins  Patient was binge drinking for the past 2 days.  -CT abdominal --changes consistent with acute pancreatitis, negative for any other significant  findings -Continue supportive care -Bleeding thiamine, folate, -Pain management-IV Dilaudid ordered.  Essential hypertension -Pressure has stabilized 135/84 today POA: BP 211/172 -Much improved  - Patient was not taking her medications for the past 2 days while she was drinking a lot of alcohol.  On multiple medications at home which include clonidine, irbesartan, HCTZ and metoprolol. -Continue home meds   Alcohol use disorder, severe, dependence (HCC) -per CIWA protocol -Will monitor closely for withdrawals  GERD without esophagitis -IV Protonix  Transaminitis Elevated liver enzymes and T. bili.  Patient also has an history of alcoholic hepatitis. -RUQ ultrasound -Viral hepatitis panel >>> all nonreactive -Supportive care    Latest Ref Rng & Units 08/31/2022    4:43 AM 08/30/2022    6:02 AM 08/30/2022   12:15 AM  Hepatic Function  Total Protein 6.5 - 8.1 g/dL 6.3  7.1  8.5   Albumin 3.5 - 5.0 g/dL 2.7  3.0  3.5   AST 15 - 41 U/L 53  75  100   ALT 0 - 44 U/L 36  47  59   Alk Phosphatase 38 - 126 U/L 135  173  216   Total Bilirubin 0.3 - 1.2 mg/dL 1.7  2.0  2.1  Tobacco use disorder -Nicotine patch -Patient has been counseled regarding cessation of tobacco use and abuse  Hypokalemia Potassium at 2.7.>>3.2    Patient has an history of hypokalemia.   -Likely due to GI loss, repleting potassium, with magnesium   Pancreatitis-resolved as of 08/31/2022 -     --------------------------------------------------------------------------------------------------------------------------- Nutritional status:  The patient's BMI is: Body mass index is 34.7 kg/m. I agree with the assessment and plan as outlined ----------------------------------------------------------------------------------------------------------------------  DVT prophylaxis:     Code Status:   Code Status: Full Code  Family Communication: No family member present at bedside- attempt will be  made to update daily The above findings and plan of care has been discussed with patient (and family)  in detail,  they expressed understanding and agreement of above. -Advance care planning has been discussed.   Admission status:   Status is: Observation The patient remains OBS appropriate and will d/c before 2 midnights.   Disposition: From  - home             Planning for discharge in 1-2 days: to   Procedures:   No admission procedures for hospital encounter.   Antimicrobials:  Anti-infectives (From admission, onward)    None        Medication:   cloNIDine  0.2 mg Oral BID   folic acid  1 mg Oral Daily   irbesartan  300 mg Oral Daily   And   hydrochlorothiazide  12.5 mg Oral Daily   metoprolol tartrate  50 mg Oral BID   multivitamin with minerals  1 tablet Oral Daily   nicotine  14 mg Transdermal Daily   pantoprazole (PROTONIX) IV  40 mg Intravenous Daily   sodium chloride flush  3 mL Intravenous Q12H   thiamine  100 mg Oral Daily   Or   thiamine  100 mg Intravenous Daily    HYDROmorphone (DILAUDID) injection, LORazepam **OR** LORazepam, ondansetron **OR** ondansetron (ZOFRAN) IV, polyethylene glycol   Objective:   Vitals:   08/31/22 0000 08/31/22 0012 08/31/22 0557 08/31/22 0939  BP: (!) 146/99 (!) 146/99 (!) 149/98 135/84  Pulse: 76 76 81 88  Resp:  17  14  Temp:  98.4 F (36.9 C) 98.7 F (37.1 C) 98.7 F (37.1 C)  TempSrc:  Oral Oral   SpO2:  98% 98% 100%  Weight:      Height:        Intake/Output Summary (Last 24 hours) at 08/31/2022 1117 Last data filed at 08/31/2022 0900 Gross per 24 hour  Intake 840 ml  Output --  Net 840 ml   Filed Weights   08/29/22 2347  Weight: 97.5 kg     Physical examination:    General:  AAO x 3,  cooperative, no distress;   HEENT:  Normocephalic, PERRL, otherwise with in Normal limits   Neuro:  CNII-XII intact. , normal motor and sensation, reflexes intact   Lungs:   Clear to auscultation BL,  Respirations unlabored,  No wheezes / crackles  Cardio:    S1/S2, RRR, No murmure, No Rubs or Gallops   Abdomen:  Soft, diffuse abdominal tenderness especially right upper quadrant area, bowel sounds active all four quadrants, no guarding or peritoneal signs.  Muscular  skeletal:  Limited exam -global generalized weaknesses - in bed, able to move all 4 extremities,   2+ pulses,  symmetric, No pitting edema  Skin:  Dry, warm to touch, negative for any Rashes,  Wounds: Please see nursing documentation          ------------------------------------------------------------------------------------------------------------------------------------------  LABs:     Latest Ref Rng & Units 08/31/2022    4:43 AM 08/30/2022    6:02 AM 08/30/2022   12:15 AM  CBC  WBC 4.0 - 10.5 K/uL 9.9  9.7  9.8   Hemoglobin 12.0 - 15.0 g/dL 65.7  84.6  96.2   Hematocrit 36.0 - 46.0 % 37.8  39.5  44.9   Platelets 150 - 400 K/uL 224  262  305       Latest Ref Rng & Units 08/31/2022    4:43 AM 08/30/2022    6:02 AM 08/30/2022   12:15 AM  CMP  Glucose 70 - 99 mg/dL 86  952  841   BUN 6 - 20 mg/dL 6  <5  <5   Creatinine 0.44 - 1.00 mg/dL 3.24  4.01  0.27   Sodium 135 - 145 mmol/L 135  135  136   Potassium 3.5 - 5.1 mmol/L 3.2  3.0  2.7   Chloride 98 - 111 mmol/L 100  101  99   CO2 22 - 32 mmol/L 28  26  26    Calcium 8.9 - 10.3 mg/dL 8.7  8.5  9.0   Total Protein 6.5 - 8.1 g/dL 6.3  7.1  8.5   Total Bilirubin 0.3 - 1.2 mg/dL 1.7  2.0  2.1   Alkaline Phos 38 - 126 U/L 135  173  216   AST 15 - 41 U/L 53  75  100   ALT 0 - 44 U/L 36  47  59        Micro Results No results found for this or any previous visit (from the past 240 hour(s)).  Radiology Reports No results found.  SIGNED: Kendell Bane, MD, FHM. FAAFP. [facility] - [facility] Time spent - 35 min.  In seeing, evaluating and examining the patient. Reviewing medical records, labs, drawn plan of care. [facility],  Pager  (please use [email or web address] to page/ text) Please use Epic Secure Chat for non-urgent communication (7AM-7PM)  If 7PM-7AM, please contact night-coverage [email or web address], 08/31/2022, 11:17 AM

## 2022-08-31 NOTE — Progress Notes (Signed)
Pt refuses 0400 VS monitoring, per protocol for new admissions for Q4 VS, since she will have VS taken for CIWA at 0600.

## 2022-08-31 NOTE — Progress Notes (Signed)
Patient toolerated clear liquid diet, no c/o nausea or vomiting, pain still there

## 2022-09-01 DIAGNOSIS — K852 Alcohol induced acute pancreatitis without necrosis or infection: Secondary | ICD-10-CM

## 2022-09-01 LAB — CBC
HCT: 37.7 % (ref 36.0–46.0)
Hemoglobin: 12.9 g/dL (ref 12.0–15.0)
MCH: 32.5 pg (ref 26.0–34.0)
MCHC: 34.2 g/dL (ref 30.0–36.0)
MCV: 95 fL (ref 80.0–100.0)
Platelets: 215 10*3/uL (ref 150–400)
RBC: 3.97 MIL/uL (ref 3.87–5.11)
RDW: 19.9 % — ABNORMAL HIGH (ref 11.5–15.5)
WBC: 7.8 10*3/uL (ref 4.0–10.5)
nRBC: 0 % (ref 0.0–0.2)

## 2022-09-01 LAB — COMPREHENSIVE METABOLIC PANEL
ALT: 32 U/L (ref 0–44)
AST: 57 U/L — ABNORMAL HIGH (ref 15–41)
Albumin: 2.7 g/dL — ABNORMAL LOW (ref 3.5–5.0)
Alkaline Phosphatase: 144 U/L — ABNORMAL HIGH (ref 38–126)
Anion gap: 10 (ref 5–15)
BUN: 5 mg/dL — ABNORMAL LOW (ref 6–20)
CO2: 27 mmol/L (ref 22–32)
Calcium: 8.6 mg/dL — ABNORMAL LOW (ref 8.9–10.3)
Chloride: 99 mmol/L (ref 98–111)
Creatinine, Ser: 0.45 mg/dL (ref 0.44–1.00)
GFR, Estimated: >60 mL/min (ref >60)
Glucose, Bld: 73 mg/dL (ref 70–99)
Potassium: 2.9 mmol/L — ABNORMAL LOW (ref 3.5–5.1)
Sodium: 136 mmol/L (ref 135–145)
Total Bilirubin: 1.8 mg/dL — ABNORMAL HIGH (ref 0.3–1.2)
Total Protein: 6.6 g/dL (ref 6.5–8.1)

## 2022-09-01 LAB — MAGNESIUM: Magnesium: 1.9 mg/dL (ref 1.7–2.4)

## 2022-09-01 LAB — LIPASE, BLOOD: Lipase: 67 U/L — ABNORMAL HIGH (ref 11–51)

## 2022-09-01 LAB — GLUCOSE, CAPILLARY: Glucose-Capillary: 74 mg/dL (ref 70–99)

## 2022-09-01 MED ORDER — CLONIDINE HCL 0.2 MG PO TABS
0.2000 mg | ORAL_TABLET | Freq: Three times a day (TID) | ORAL | Status: DC
  Administered 2022-09-01 – 2022-09-02 (×2): 0.2 mg via ORAL
  Filled 2022-09-01 (×2): qty 1

## 2022-09-01 MED ORDER — IRBESARTAN 150 MG PO TABS
300.0000 mg | ORAL_TABLET | Freq: Every day | ORAL | Status: DC
  Administered 2022-09-02: 300 mg via ORAL
  Filled 2022-09-01: qty 2

## 2022-09-01 MED ORDER — POTASSIUM CHLORIDE 20 MEQ PO PACK
40.0000 meq | PACK | Freq: Once | ORAL | Status: AC
  Administered 2022-09-01: 40 meq via ORAL
  Filled 2022-09-01: qty 2

## 2022-09-01 MED ORDER — PANTOPRAZOLE SODIUM 40 MG PO TBEC: 40.0000 mg | DELAYED_RELEASE_TABLET | Freq: Two times a day (BID) | ORAL | 0 refills | Status: DC

## 2022-09-01 MED ORDER — PANTOPRAZOLE SODIUM 40 MG PO TBEC
40.0000 mg | DELAYED_RELEASE_TABLET | Freq: Two times a day (BID) | ORAL | Status: DC
  Administered 2022-09-01 – 2022-09-02 (×3): 40 mg via ORAL
  Filled 2022-09-01 (×3): qty 1

## 2022-09-01 MED ORDER — POTASSIUM CHLORIDE 2 MEQ/ML IV SOLN
INTRAVENOUS | Status: DC
  Filled 2022-09-01 (×3): qty 1000

## 2022-09-01 MED ORDER — ACAMPROSATE CALCIUM 333 MG PO TBEC
666.0000 mg | DELAYED_RELEASE_TABLET | Freq: Three times a day (TID) | ORAL | Status: DC
  Administered 2022-09-01 – 2022-09-02 (×3): 666 mg via ORAL
  Filled 2022-09-01 (×7): qty 2

## 2022-09-01 MED ORDER — OLMESARTAN MEDOXOMIL-HCTZ 40-12.5 MG PO TABS
1.0000 | ORAL_TABLET | Freq: Every day | ORAL | 1 refills | Status: DC
Start: 2022-09-01 — End: 2022-11-27

## 2022-09-01 MED ORDER — HYDROCHLOROTHIAZIDE 12.5 MG PO TABS
12.5000 mg | ORAL_TABLET | Freq: Every day | ORAL | Status: DC
  Administered 2022-09-01 – 2022-09-02 (×2): 12.5 mg via ORAL
  Filled 2022-09-01: qty 1

## 2022-09-01 MED ORDER — POTASSIUM CHLORIDE CRYS ER 20 MEQ PO TBCR
40.0000 meq | EXTENDED_RELEASE_TABLET | Freq: Once | ORAL | Status: AC
  Administered 2022-09-01: 40 meq via ORAL
  Filled 2022-09-01: qty 2

## 2022-09-01 MED ORDER — MORPHINE SULFATE (PF) 2 MG/ML IV SOLN
2.0000 mg | Freq: Once | INTRAVENOUS | Status: AC
  Administered 2022-09-01: 2 mg via INTRAVENOUS
  Filled 2022-09-01: qty 1

## 2022-09-01 MED ORDER — CLONIDINE HCL 0.2 MG PO TABS: 0.2000 mg | ORAL_TABLET | Freq: Two times a day (BID) | ORAL | 1 refills | Status: DC

## 2022-09-01 MED ORDER — METOPROLOL TARTRATE 50 MG PO TABS: 50.0000 mg | ORAL_TABLET | Freq: Two times a day (BID) | ORAL | 1 refills | Status: DC

## 2022-09-01 MED ORDER — VITAMIN B-1 100 MG PO TABS: 100.0000 mg | ORAL_TABLET | Freq: Every day | ORAL | 0 refills | Status: AC

## 2022-09-01 MED ORDER — CLONIDINE HCL 0.2 MG PO TABS
0.2000 mg | ORAL_TABLET | Freq: Once | ORAL | Status: AC
  Administered 2022-09-01: 0.2 mg via ORAL
  Filled 2022-09-01: qty 1

## 2022-09-01 MED ORDER — OXYCODONE HCL 5 MG PO TABS: 5.0000 mg | ORAL_TABLET | ORAL | 0 refills | Status: AC | PRN

## 2022-09-01 MED ORDER — ADULT MULTIVITAMIN W/MINERALS CH: 1.0000 | ORAL_TABLET | Freq: Every day | ORAL | 0 refills | Status: AC

## 2022-09-01 MED ORDER — OXYCODONE HCL 5 MG PO TABS
5.0000 mg | ORAL_TABLET | Freq: Four times a day (QID) | ORAL | Status: DC | PRN
  Administered 2022-09-01 – 2022-09-02 (×2): 5 mg via ORAL
  Filled 2022-09-01 (×2): qty 1

## 2022-09-01 MED ORDER — ACAMPROSATE CALCIUM 333 MG PO TBEC: 666.0000 mg | DELAYED_RELEASE_TABLET | Freq: Three times a day (TID) | ORAL | 0 refills | Status: AC

## 2022-09-01 NOTE — TOC Transition Note (Signed)
Transition of Care [facility]) - CM/SW Discharge Note   Patient Details  Name: Veronica Calhoun MRN: [id number] Date of Birth: [birth year]  Transition of Care [facility]) CM/SW Contact:  Leitha Bleak, RN Phone Number: 09/01/2022, 10:43 AM   Clinical Narrative:   Patient discharging home.TOC consulted for Substance abuse resources. CM spoke with patient, she she has not tried any meetings or treatment plans in the past. Resources added to AVS. Patient is asking about short term disability paper work. CM explained she will have to ask MD to fill that out.    Final next level of care: Home/Self Care Barriers to Discharge: Barriers Resolved   Discharge Placement    Home    Patient and family notified of of transfer: 09/01/22  Discharge Plan and Services Additional resources added to the After Visit Summary for           Social Determinants of Health (SDOH) Interventions SDOH Screenings   Food Insecurity: No Food Insecurity (06/24/2022)  Housing: Low Risk  (06/24/2022)  Transportation Needs: No Transportation Needs (06/24/2022)  Utilities: Not At Risk (06/24/2022)  Depression (PHQ2-9): Low Risk  (07/25/2022)  Physical Activity: Inactive (09/02/2017)  Stress: Stress Concern Present (09/02/2017)  Tobacco Use: High Risk (08/29/2022)    Readmission Risk Interventions     No data to display

## 2022-09-01 NOTE — Progress Notes (Signed)
   09/01/22 0816  Vitals  Temp 98.2 F (36.8 C)  Temp Source Oral  BP (!) 154/101  MAP (mmHg) 116  BP Location Left Arm  BP Method Automatic  Patient Position (if appropriate) Lying  Pulse Rate 76  Pulse Rate Source Dinamap  Resp 18  Level of Consciousness  Level of Consciousness Alert  MEWS COLOR  MEWS Score Color Green  Oxygen Therapy  SpO2 100 %  O2 Device Room Air  MEWS Score  MEWS Temp 0  MEWS Systolic 0  MEWS Pulse 0  MEWS RR 0  MEWS LOC 0  MEWS Score 0  Provider Notification  Provider Name/Title Dr Sherryll Burger  Date Provider Notified 09/01/22  Time Provider Notified [phone]  Method of Notification Page  Notification Reason Other (Comment) (b/p 154/101)  Provider response No new orders  Date of Provider Response 09/01/22  Time of Provider Response [phone]

## 2022-09-01 NOTE — Discharge Summary (Signed)
Physician Discharge Summary   Patient: Veronica Calhoun MRN: [id number] DOB: February 11, 1986  Admit date:     08/30/2022  Discharge date: 09/01/22  Discharge Physician: Kendell Bane   PCP: Rica Records, FNP   Recommendations at discharge:  -Continue oral hydration with Pedialyte or Gatorade zero -Advance her diet very slowly -Avoid Tylenol, Tylenol products -Avoid alcohol or alcohol products  Discharge Diagnoses: Principal Problem:   Acute alcoholic pancreatitis Active Problems:   Essential hypertension   Alcohol use disorder, severe, dependence (HCC)   GERD without esophagitis   Transaminitis   Tobacco use disorder   Hypokalemia  Resolved Problems:   Pancreatitis  Hospital Course:  Veronica Calhoun is a 37 y.o. female with medical history significant of alcohol abuse, recurrent pancreatitis and hypertension came to ED with sudden onset abdominal pain along with nausea and vomiting started few hours before coming to ED.   Patient was binge drinking for the past 2 days and was not taking her medications.   Patient denies any recent illnesses, no upper respiratory symptoms.  She was having epigastric and right upper quadrant pain.  No diarrhea.  No urinary symptoms.  No fever or chills.   ED course: Blood pressure elevated at 211/134, afebrile.  Labs pertinent for lipase at 74 and potassium of 2.7.  AST 100, ALT 59, alkaline phosphatase 216 and T. bili of 2.1. Pending CT abdomen and RUQ ultrasound.   * Acute alcoholic pancreatitis -Still complaining abdominal pain, improved with IV analgesics -Abdominal pain in the setting of acute alcohol induced pancreatitis --Tolerating clear liquid diet -IV fluid with LR -Improved lipase, LFTs, -Avoiding hepatotoxins  Patient was binge drinking for the past 2 days.  -CT abdominal --changes consistent with acute pancreatitis, negative for any other significant findings -This post hydration with IV fluids, along with  vitamin replacement thiamine, folate, -Pain management-IV Dilaudid >>> oral analgesics provided  Essential hypertension -Pressure has stabilized  POA: BP 211/172 -Much improved  - Patient was not taking her medications for the past 2 days while she was drinking a lot of alcohol.  On multiple medications at home which include clonidine, irbesartan, HCTZ and metoprolol. -Continue home meds   Alcohol use disorder, severe, dependence (HCC) -per CIWA protocol -Will monitor closely for withdrawals  GERD without esophagitis -IV Protonix >> switch to p.o. PPI  Transaminitis Elevated liver enzymes and T. bili.  Patient also has an history of alcoholic hepatitis. -RUQ ultrasound -Viral hepatitis panel >>> all nonreactive -Supportive care    Latest Ref Rng & Units 09/01/2022    4:17 AM 08/31/2022    4:43 AM 08/30/2022    6:02 AM  Hepatic Function  Total Protein 6.5 - 8.1 g/dL 6.6  6.3  7.1   Albumin 3.5 - 5.0 g/dL 2.7  2.7  3.0   AST 15 - 41 U/L 57  53  75   ALT 0 - 44 U/L 32  36  47   Alk Phosphatase 38 - 126 U/L 144  135  173   Total Bilirubin 0.3 - 1.2 mg/dL 1.8  1.7  2.0      Tobacco use disorder -Nicotine patch -Patient has been counseled regarding cessation of tobacco use and abuse  Hypokalemia Potassium at 2.7.>>3.2    Patient has an history of hypokalemia.   -Likely due to GI loss, repleting potassium, with magnesium    -     Pain control - [place] Controlled Substance Reporting System database was reviewed. and  patient was instructed, not to drive, operate heavy machinery, perform activities at heights, swimming or participation in water activities or provide baby-sitting services while on Pain, Sleep and Anxiety Medications; until their outpatient Physician has advised to do so again. Also recommended to not to take more than prescribed Pain, Sleep and Anxiety Medications.   Disposition: Home Diet recommendation:  Discharge Diet Orders (From admission,  onward)     Start     Ordered   09/01/22 0000  Diet - low sodium heart healthy        09/01/22 1232           Full liquid diet DISCHARGE MEDICATION: Allergies as of 09/01/2022       Reactions   Lisinopril Swelling   Reports Lip swelling   Amlodipine Other (See Comments)   "like I was going to pass out"        Medication List     TAKE these medications    acamprosate 333 MG tablet Commonly known as: CAMPRAL Take 2 tablets (666 mg total) by mouth 3 (three) times daily with meals.   albuterol 108 (90 Base) MCG/ACT inhaler Commonly known as: VENTOLIN HFA Inhale 2 puffs into the lungs every 6 (six) hours as needed for wheezing or shortness of breath.   BC Headache 325-95-16 MG Tabs Generic drug: Aspirin-Salicylamide-Caffeine Take 1 Package by mouth daily as needed (for pain).   cloNIDine 0.2 MG tablet Commonly known as: CATAPRES Take 1 tablet (0.2 mg total) by mouth 2 (two) times daily.   metoprolol tartrate 50 MG tablet Commonly known as: LOPRESSOR Take 1 tablet (50 mg total) by mouth 2 (two) times daily.   multivitamin with minerals Tabs tablet Take 1 tablet by mouth daily. Start taking on: September 02, 2022   olmesartan-hydrochlorothiazide 40-12.5 MG tablet Commonly known as: BENICAR HCT Take 1 tablet by mouth daily.   oxyCODONE 5 MG immediate release tablet Commonly known as: Roxicodone Take 1 tablet (5 mg total) by mouth every 4 (four) hours as needed for up to 3 days for severe pain.   pantoprazole 40 MG tablet Commonly known as: PROTONIX Take 1 tablet (40 mg total) by mouth 2 (two) times daily.   thiamine 100 MG tablet Commonly known as: Vitamin B-1 Take 1 tablet (100 mg total) by mouth daily for 5 days. Start taking on: September 02, 2022        Discharge Exam: Ceasar Mons Weights   08/29/22 2347  Weight: 97.5 kg        General:  AAO x 3,  cooperative, no distress;   HEENT:  Normocephalic, PERRL, otherwise with in Normal limits   Neuro:   CNII-XII intact. , normal motor and sensation, reflexes intact   Lungs:   Clear to auscultation BL, Respirations unlabored,  No wheezes / crackles  Cardio:    S1/S2, RRR, No murmure, No Rubs or Gallops   Abdomen:  Soft, non-tender, bowel sounds active all four quadrants, no guarding or peritoneal signs.  Muscular  skeletal:  Limited exam -global generalized weaknesses - in bed, able to move all 4 extremities,   2+ pulses,  symmetric, No pitting edema  Skin:  Dry, warm to touch, negative for any Rashes,  Wounds: Please see nursing documentation          Condition at discharge: good  The results of significant diagnostics from this hospitalization (including imaging, microbiology, ancillary and laboratory) are listed below for reference.   Imaging Studies: US Abdomen Limited RUQ (LIVER/GB)  Result Date: 08/30/2022 CLINICAL DATA:  Right upper quadrant/epigastric pain for 3 days. EXAM: ULTRASOUND ABDOMEN LIMITED RIGHT UPPER QUADRANT COMPARISON:  CT AP 08/30/2022 FINDINGS: Gallbladder: No gallstones or wall thickening visualized. No sonographic Murphy sign noted by sonographer. Common bile duct: Diameter: 3 mm Liver: Diffuse increased parenchymal echogenicity compatible with hepatic steatosis. No focal liver abnormality. Portal vein is patent on color Doppler imaging with normal direction of blood flow towards the liver. Other: None. IMPRESSION: 1. No acute findings. 2. Hepatic steatosis. Electronically Signed   By: Signa Kell M.D.   On: 08/30/2022 09:10   CT ABDOMEN PELVIS W CONTRAST  Result Date: 08/30/2022 CLINICAL DATA:  Abdominal pain, possible pancreatitis. EXAM: CT ABDOMEN AND PELVIS WITH CONTRAST TECHNIQUE: Multidetector CT imaging of the abdomen and pelvis was performed using the standard protocol following bolus administration of intravenous contrast. RADIATION DOSE REDUCTION: This exam was performed according to the departmental dose-optimization program which includes  automated exposure control, adjustment of the mA and/or kV according to patient size and/or use of iterative reconstruction technique. CONTRAST:  OMNIPAQUE IOHEXOL 300 MG/ML  SOLN COMPARISON:  07/10/2022. FINDINGS: Lower chest: No acute abnormality. Hepatobiliary: No focal abnormality in the liver. Diffuse fatty infiltration is noted. No biliary ductal dilatation. The gallbladder is without stones. Pancreas: Peripancreatic fat stranding is noted. No peripancreatic ductal dilatation is seen. A fluid collection is noted in the pancreatic tail measuring up to 2.8 cm. Few smaller collections are noted in the left upper quadrant in the perisplenic space. Spleen: Normal in size without focal abnormality. Adrenals/Urinary Tract: The adrenal glands are within normal limits. The kidneys enhance symmetrically. No renal calculus or hydronephrosis. The bladder is unremarkable. Stomach/Bowel: Stomach is within normal limits. Appendix appears normal. No evidence of bowel wall thickening, distention, or inflammatory changes. No free air or pneumatosis. Vascular/Lymphatic: No significant vascular findings are present. No enlarged abdominal or pelvic lymph nodes. Reproductive: Uterus and bilateral adnexa are unremarkable. Other: No abdominopelvic ascites. Fat containing umbilical hernia is present. Musculoskeletal: No acute osseous abnormality. IMPRESSION: 1. Findings compatible with acute pancreatitis. Multiple fluid collections are present in the pancreatic tail and left upper quadrant, slightly decreased in size from the prior exam. 2. Hepatic steatosis. Electronically Signed   By: Thornell Sartorius M.D.   On: 08/30/2022 04:36    Microbiology: Results for orders placed or performed during the hospital encounter of 06/23/22  Blood culture (routine x 2)     Status: None   Collection Time: 06/23/22 11:20 PM   Specimen: BLOOD  Result Value Ref Range Status   Specimen Description   Final    BLOOD BLOOD LEFT ARM Performed  at [facility], [address], [place], [place] [address]    Special Requests   Final    BOTTLES DRAWN AEROBIC AND ANAEROBIC Blood Culture adequate volume Performed at [facility], [address] [place], [place] [address]    Culture   Final    NO GROWTH 5 DAYS Performed at [facility], [address], [place], [place] [address]    Report Status 06/29/2022 FINAL  Final  Blood culture (routine x 2)     Status: None   Collection Time: 06/23/22 11:25 PM   Specimen: BLOOD  Result Value Ref Range Status   Specimen Description   Final    BLOOD BLOOD RIGHT FOREARM Performed at [facility], [address] [place], [place] [address]    Special Requests   Final  BOTTLES DRAWN AEROBIC AND ANAEROBIC Blood Culture adequate volume Performed at [facility], [address] [place], [place] [address]    Culture   Final    NO GROWTH 5 DAYS Performed at [facility], [address] [place], [place] [address]    Report Status 06/29/2022 FINAL  Final  MRSA Next Gen by PCR, Nasal     Status: None   Collection Time: 06/24/22  9:07 AM   Specimen: Nasal Mucosa; Nasal Swab  Result Value Ref Range Status   MRSA by PCR Next Gen NOT DETECTED NOT DETECTED Final    Comment: (NOTE) The GeneXpert MRSA Assay (FDA approved for NASAL specimens only), is one component of a comprehensive MRSA colonization surveillance program. It is not intended to diagnose MRSA infection nor to guide or monitor treatment for MRSA infections. Test performance is not FDA approved in patients less than 53 years old. Performed at [facility], [address] [place], [place] [address]   Resp panel by RT-PCR (RSV, Flu A&B, Covid) Nasal Mucosa     Status: None   Collection Time: 06/24/22  9:07 AM   Specimen: Nasal Mucosa; Nasal Swab  Result Value Ref Range Status   SARS Coronavirus 2 by RT PCR NEGATIVE NEGATIVE  Final    Comment: (NOTE) SARS-CoV-2 target nucleic acids are NOT DETECTED.  The SARS-CoV-2 RNA is generally detectable in upper respiratory specimens during the acute phase of infection. The lowest concentration of SARS-CoV-2 viral copies this assay can detect is 138 copies/mL. A negative result does not preclude SARS-Cov-2 infection and should not be used as the sole basis for treatment or other patient management decisions. A negative result may occur with  improper specimen collection/handling, submission of specimen other than nasopharyngeal swab, presence of viral mutation(s) within the areas targeted by this assay, and inadequate number of viral copies(<138 copies/mL). A negative result must be combined with clinical observations, patient history, and epidemiological information. The expected result is Negative.  Fact Sheet for Patients:  BloggerCourse.com  Fact Sheet for Healthcare Providers:  SeriousBroker.it  This test is no t yet approved or cleared by the [place] FDA and  has been authorized for detection and/or diagnosis of SARS-CoV-2 by FDA under an Emergency Use Authorization (EUA). This EUA will remain  in effect (meaning this test can be used) for the duration of the COVID-19 declaration under Section 564(b)(1) of the Act, 21 U.S.C.section 360bbb-3(b)(1), unless the authorization is terminated  or revoked sooner.       Influenza A by PCR NEGATIVE NEGATIVE Final   Influenza B by PCR NEGATIVE NEGATIVE Final    Comment: (NOTE) The Xpert Xpress SARS-CoV-2/FLU/RSV plus assay is intended as an aid in the diagnosis of influenza from Nasopharyngeal swab specimens and should not be used as a sole basis for treatment. Nasal washings and aspirates are unacceptable for Xpert Xpress SARS-CoV-2/FLU/RSV testing.  Fact Sheet for Patients: BloggerCourse.com  Fact Sheet for Healthcare  Providers: SeriousBroker.it  This test is not yet approved or cleared by the [place] FDA and has been authorized for detection and/or diagnosis of SARS-CoV-2 by FDA under an Emergency Use Authorization (EUA). This EUA will remain in effect (meaning this test can be used) for the duration of the COVID-19 declaration under Section 564(b)(1) of the Act, 21 U.S.C. section 360bbb-3(b)(1), unless the authorization is terminated or revoked.     Resp Syncytial Virus by PCR NEGATIVE NEGATIVE Final    Comment: (NOTE) Fact  Sheet for Patients: BloggerCourse.com  Fact Sheet for Healthcare Providers: SeriousBroker.it  This test is not yet approved or cleared by the [place] FDA and has been authorized for detection and/or diagnosis of SARS-CoV-2 by FDA under an Emergency Use Authorization (EUA). This EUA will remain in effect (meaning this test can be used) for the duration of the COVID-19 declaration under Section 564(b)(1) of the Act, 21 U.S.C. section 360bbb-3(b)(1), unless the authorization is terminated or revoked.  Performed at [facility], [address], [place], [place] [address]     Labs: CBC: Recent Labs  Lab 08/30/22 0015 08/30/22 0602 08/31/22 0443 09/01/22 0417  WBC 9.8 9.7 9.9 7.8  HGB 16.1* 14.0 13.0 12.9  HCT 44.9 39.5 37.8 37.7  MCV 90.2 91.4 93.8 95.0  PLT 305 262 224 215   Basic Metabolic Panel: Recent Labs  Lab 08/30/22 0015 08/30/22 0602 08/31/22 0443 09/01/22 0417  NA 136 135 135 136  K 2.7* 3.0* 3.2* 2.9*  CL 99 101 100 99  CO2 26 26 28 27   GLUCOSE 118* 104* 86 73  BUN <5* <5* 6 5*  CREATININE 0.50 0.45 0.46 0.45  CALCIUM 9.0 8.5* 8.7* 8.6*  MG 1.7  --   --  1.9   Liver Function Tests: Recent Labs  Lab 08/30/22 0015 08/30/22 0602 08/31/22 0443 09/01/22 0417  AST 100* 75* 53* 57*  ALT 59* 47* 36 32  ALKPHOS 216* 173* 135* 144*   BILITOT 2.1* 2.0* 1.7* 1.8*  PROT 8.5* 7.1 6.3* 6.6  ALBUMIN 3.5 3.0* 2.7* 2.7*   CBG: Recent Labs  Lab 08/30/22 0825 09/01/22 0734  GLUCAP 101* 74    Discharge time spent: greater than 40 minutes.  Signed: Kendell Bane, MD [facility] 09/01/2022

## 2022-09-01 NOTE — Progress Notes (Addendum)
   09/01/22 1500  Vitals  Temp 98.4 F (36.9 C)  Temp Source Oral  BP (!) 163/120  MAP (mmHg) 133  BP Location Right Arm  BP Method Automatic  Patient Position (if appropriate) Sitting  Pulse Rate 76  Pulse Rate Source Dinamap  Resp 16  MEWS COLOR  MEWS Score Color Green  Oxygen Therapy  SpO2 100 %  O2 Device Room Air  MEWS Score  MEWS Temp 0  MEWS Systolic 0  MEWS Pulse 0  MEWS RR 0  MEWS LOC 0  MEWS Score 0  Provider Notification  Provider Name/Title Dr Flossie Dibble  Date Provider Notified 09/01/22  Time Provider Notified 1558  Method of Notification Page  Notification Reason Other (Comment) (b/p 163/120)  Date of Provider Response 09/01/22  Time of Provider Response 1559

## 2022-09-02 DIAGNOSIS — K852 Alcohol induced acute pancreatitis without necrosis or infection: Secondary | ICD-10-CM | POA: Diagnosis not present

## 2022-09-02 LAB — COMPREHENSIVE METABOLIC PANEL
ALT: 49 U/L — ABNORMAL HIGH (ref 0–44)
AST: 112 U/L — ABNORMAL HIGH (ref 15–41)
Albumin: 2.7 g/dL — ABNORMAL LOW (ref 3.5–5.0)
Alkaline Phosphatase: 172 U/L — ABNORMAL HIGH (ref 38–126)
Anion gap: 7 (ref 5–15)
BUN: <5 mg/dL — ABNORMAL LOW (ref 6–20)
CO2: 26 mmol/L (ref 22–32)
Calcium: 8.7 mg/dL — ABNORMAL LOW (ref 8.9–10.3)
Chloride: 103 mmol/L (ref 98–111)
Creatinine, Ser: 0.55 mg/dL (ref 0.44–1.00)
GFR, Estimated: >60 mL/min (ref >60)
Glucose, Bld: 99 mg/dL (ref 70–99)
Potassium: 3.7 mmol/L (ref 3.5–5.1)
Sodium: 136 mmol/L (ref 135–145)
Total Bilirubin: 1.3 mg/dL — ABNORMAL HIGH (ref 0.3–1.2)
Total Protein: 6.6 g/dL (ref 6.5–8.1)

## 2022-09-02 LAB — CBC
HCT: 37.8 % (ref 36.0–46.0)
Hemoglobin: 13 g/dL (ref 12.0–15.0)
MCH: 32.9 pg (ref 26.0–34.0)
MCHC: 34.4 g/dL (ref 30.0–36.0)
MCV: 95.7 fL (ref 80.0–100.0)
Platelets: 213 10*3/uL (ref 150–400)
RBC: 3.95 MIL/uL (ref 3.87–5.11)
RDW: 20 % — ABNORMAL HIGH (ref 11.5–15.5)
WBC: 8.9 10*3/uL (ref 4.0–10.5)
nRBC: 0 % (ref 0.0–0.2)

## 2022-09-02 LAB — LIPASE, BLOOD: Lipase: 56 U/L — ABNORMAL HIGH (ref 11–51)

## 2022-09-02 MED ORDER — LORAZEPAM 0.5 MG PO TABS
0.5000 mg | ORAL_TABLET | Freq: Once | ORAL | Status: AC
  Administered 2022-09-02: 0.5 mg via ORAL
  Filled 2022-09-02: qty 1

## 2022-09-02 MED ORDER — CLONIDINE HCL 0.2 MG PO TABS: 0.2000 mg | ORAL_TABLET | Freq: Three times a day (TID) | ORAL | 0 refills | Status: DC

## 2022-09-02 MED ORDER — MORPHINE SULFATE (PF) 2 MG/ML IV SOLN: 2.0000 mg | Freq: Once | INTRAVENOUS | Status: DC

## 2022-09-02 NOTE — Progress Notes (Signed)
Patient displaying opiate and benzo seeking behavior the entire night. Dilaudid has been de-escalated to morphine. CIWA benzos have expired. A one time dose of ativan PO was given. A librium taper could be considered. Patient is very rude to the Rns and a substantial amount of time is taken up discussing her medications with her.

## 2022-09-02 NOTE — Progress Notes (Signed)
Patient has been become irate at approx. 1945 regarding no longer having the same intravenous medication orders as previous to discharge. As reported off from previous shift pt was to discharge on 4/29 and IV access discontinued. Pt kept past indicated discharge to be monitored due elevated BP at time of discharge. Reported from previous shift that "pt was transitioned to po meds." Pt was given that information and expressed " that this was her first time given the education regarding changes in here medication and transitioning to po medications in preparation of possible discharge on 4/30. Provider made aware and ordered "that pt has to take po oxycodone 5 mg as the first line for pain control and restart an IV access. Pt became very irate regarding the information " informing multiple times that oxycodone does not work for her and the providers are made aware that oxycodone does not control her pain". Pt had next processed to leave AMA informing that "the doctor needs to drive her home" " because she will be leaving if they don't let her have the IV pain medication that she had previous." Pt educated and reason and purpose of 1st line po pain medication. Pt agreed to take po oxycodone 1st while indicating " that she will only take medication to inform in the next few minutes that the medication did not work for her ans that she will need something else." Po oxycodone administered for pain . On reassessment pt did as she had indicated informing "she needed something else for pain." Provider order morphine 2mg /ml X 1 for pain. Administered medication. Patient observed sleeping on reassessment.

## 2022-09-02 NOTE — Telephone Encounter (Signed)
Patient called said medical certificate form page 6 of 11 needs not to say no should say yes patient was bed ridden and no lifting for a whole month

## 2022-09-02 NOTE — Discharge Summary (Signed)
Physician Discharge Summary   Patient: Veronica Calhoun MRN: [id number] DOB: [birth year]  Admit date:     08/30/2022  Discharge date: 09/02/22  Discharge Physician: Kendell Bane   PCP: Rica Records, FNP   Patient stayed overnight due to uncontrolled blood pressure ... Continues to exhibit drug-seeking behavior overnight.. BP meds has been adjusted patient has been cleared to discharge home  Recommendations at discharge:  -Continue oral hydration with Pedialyte or Gatorade zero -Advance her diet very slowly -Avoid Tylenol, Tylenol products -Avoid alcohol or alcohol products  Discharge Diagnoses: Principal Problem:   Acute alcoholic pancreatitis Active Problems:   Essential hypertension   Alcohol use disorder, severe, dependence (HCC)   GERD without esophagitis   Transaminitis   Tobacco use disorder   Hypokalemia  Resolved Problems:   Pancreatitis  Hospital Course:  Veronica Calhoun is a 37 y.o. female with medical history significant of alcohol abuse, recurrent pancreatitis and hypertension came to ED with sudden onset abdominal pain along with nausea and vomiting started few hours before coming to ED.   Patient was binge drinking for the past 2 days and was not taking her medications.   Patient denies any recent illnesses, no upper respiratory symptoms.  She was having epigastric and right upper quadrant pain.  No diarrhea.  No urinary symptoms.  No fever or chills.   ED course: Blood pressure elevated at 211/134, afebrile.  Labs pertinent for lipase at 74 and potassium of 2.7.  AST 100, ALT 59, alkaline phosphatase 216 and T. bili of 2.1. Pending CT abdomen and RUQ ultrasound.   * Acute alcoholic pancreatitis -Still complaining abdominal pain, improved with IV analgesics -Abdominal pain in the setting of acute alcohol induced pancreatitis --Tolerating clear liquid diet -IV fluid with LR -Improved lipase, LFTs, -Avoiding hepatotoxins  Patient was  binge drinking for the past 2 days.  -CT abdominal --changes consistent with acute pancreatitis, negative for any other significant findings -This post hydration with IV fluids, along with vitamin replacement thiamine, folate, -Pain management-IV Dilaudid >>> oral analgesics provided  Essential hypertension -Pressure has stabilized  POA: BP 211/172 -Much improved  - Patient was not taking her medications for the past 2 days while she was drinking a lot of alcohol.  On multiple medications at home which include clonidine, irbesartan, HCTZ and metoprolol. -Continue home meds   Alcohol use disorder, severe, dependence (HCC) -per [facility] protocol -Will monitor closely for withdrawals  GERD without esophagitis -IV Protonix >> switch to p.o. PPI  Transaminitis Elevated liver enzymes and T. bili.  Patient also has an history of alcoholic hepatitis. -RUQ ultrasound -Viral hepatitis panel >>> all nonreactive -Supportive care    Latest Ref Rng & Units 09/02/2022    4:25 AM 09/01/2022    4:17 AM 08/31/2022    4:43 AM  Hepatic Function  Total Protein 6.5 - 8.1 g/dL 6.6  6.6  6.3   Albumin 3.5 - 5.0 g/dL 2.7  2.7  2.7   AST 15 - 41 U/L 112  57  53   ALT 0 - 44 U/L 49  32  36   Alk Phosphatase 38 - 126 U/L 172  144  135   Total Bilirubin 0.3 - 1.2 mg/dL 1.3  1.8  1.7      Tobacco use disorder -Nicotine patch -Patient has been counseled regarding cessation of tobacco use and abuse  Hypokalemia Potassium at 2.7.>>3.2    Patient has an history of hypokalemia.   -Likely due  to GI loss, repleting potassium, with magnesium    -     Pain control - [place] Controlled Substance Reporting System database was reviewed. and patient was instructed, not to drive, operate heavy machinery, perform activities at heights, swimming or participation in water activities or provide baby-sitting services while on Pain, Sleep and Anxiety Medications; until their outpatient Physician has advised  to do so again. Also recommended to not to take more than prescribed Pain, Sleep and Anxiety Medications.   Disposition: Home Diet recommendation:  Discharge Diet Orders (From admission, onward)     Start     Ordered   09/01/22 0000  Diet - low sodium heart healthy        09/01/22 1232           Full liquid diet DISCHARGE MEDICATION: Allergies as of 09/02/2022       Reactions   Lisinopril Swelling   Reports Lip swelling   Amlodipine Other (See Comments)   "like I was going to pass out"        Medication List     TAKE these medications    acamprosate 333 MG tablet Commonly known as: CAMPRAL Take 2 tablets (666 mg total) by mouth 3 (three) times daily with meals.   albuterol 108 (90 Base) MCG/ACT inhaler Commonly known as: VENTOLIN HFA Inhale 2 puffs into the lungs every 6 (six) hours as needed for wheezing or shortness of breath.   BC Headache 325-95-16 MG Tabs Generic drug: Aspirin-Salicylamide-Caffeine Take 1 Package by mouth daily as needed (for pain).   cloNIDine 0.2 MG tablet Commonly known as: CATAPRES Take 1 tablet (0.2 mg total) by mouth 3 (three) times daily. What changed: when to take this   metoprolol tartrate 50 MG tablet Commonly known as: LOPRESSOR Take 1 tablet (50 mg total) by mouth 2 (two) times daily.   multivitamin with minerals Tabs tablet Take 1 tablet by mouth daily.   olmesartan-hydrochlorothiazide 40-12.5 MG tablet Commonly known as: BENICAR HCT Take 1 tablet by mouth daily.   oxyCODONE 5 MG immediate release tablet Commonly known as: Roxicodone Take 1 tablet (5 mg total) by mouth every 4 (four) hours as needed for up to 3 days for severe pain.   pantoprazole 40 MG tablet Commonly known as: PROTONIX Take 1 tablet (40 mg total) by mouth 2 (two) times daily.   thiamine 100 MG tablet Commonly known as: Vitamin B-1 Take 1 tablet (100 mg total) by mouth daily for 5 days.        Discharge Exam: Filed Weights   08/29/22  2347  Weight: 97.5 kg        General:  AAO x 3,  cooperative, no distress;   HEENT:  Normocephalic, PERRL, otherwise with in Normal limits   Neuro:  CNII-XII intact. , normal motor and sensation, reflexes intact   Lungs:   Clear to auscultation BL, Respirations unlabored,  No wheezes / crackles  Cardio:    S1/S2, RRR, No murmure, No Rubs or Gallops   Abdomen:  Soft, non-tender, bowel sounds active all four quadrants, no guarding or peritoneal signs.  Muscular  skeletal:  Limited exam -global generalized weaknesses - in bed, able to move all 4 extremities,   2+ pulses,  symmetric, No pitting edema  Skin:  Dry, warm to touch, negative for any Rashes,  Wounds: Please see nursing documentation          Condition at discharge: good  The results of significant diagnostics from this  hospitalization (including imaging, microbiology, ancillary and laboratory) are listed below for reference.   Imaging Studies: US Abdomen Limited RUQ (LIVER/GB)  Result Date: 08/30/2022 CLINICAL DATA:  Right upper quadrant/epigastric pain for 3 days. EXAM: ULTRASOUND ABDOMEN LIMITED RIGHT UPPER QUADRANT COMPARISON:  CT [facility] 08/30/2022 FINDINGS: Gallbladder: No gallstones or wall thickening visualized. No sonographic Murphy sign noted by sonographer. Common bile duct: Diameter: 3 mm Liver: Diffuse increased parenchymal echogenicity compatible with hepatic steatosis. No focal liver abnormality. Portal vein is patent on color Doppler imaging with normal direction of blood flow towards the liver. Other: None. IMPRESSION: 1. No acute findings. 2. Hepatic steatosis. Electronically Signed   By: Signa Kell M.D.   On: 08/30/2022 09:10   CT ABDOMEN PELVIS W CONTRAST  Result Date: 08/30/2022 CLINICAL DATA:  Abdominal pain, possible pancreatitis. EXAM: CT ABDOMEN AND PELVIS WITH CONTRAST TECHNIQUE: Multidetector CT imaging of the abdomen and pelvis was performed using the standard protocol following bolus  administration of intravenous contrast. RADIATION DOSE REDUCTION: This exam was performed according to the departmental dose-optimization program which includes automated exposure control, adjustment of the mA and/or kV according to patient size and/or use of iterative reconstruction technique. CONTRAST:  OMNIPAQUE IOHEXOL 300 MG/ML  SOLN COMPARISON:  07/10/2022. FINDINGS: Lower chest: No acute abnormality. Hepatobiliary: No focal abnormality in the liver. Diffuse fatty infiltration is noted. No biliary ductal dilatation. The gallbladder is without stones. Pancreas: Peripancreatic fat stranding is noted. No peripancreatic ductal dilatation is seen. A fluid collection is noted in the pancreatic tail measuring up to 2.8 cm. Few smaller collections are noted in the left upper quadrant in the perisplenic space. Spleen: Normal in size without focal abnormality. Adrenals/Urinary Tract: The adrenal glands are within normal limits. The kidneys enhance symmetrically. No renal calculus or hydronephrosis. The bladder is unremarkable. Stomach/Bowel: Stomach is within normal limits. Appendix appears normal. No evidence of bowel wall thickening, distention, or inflammatory changes. No free air or pneumatosis. Vascular/Lymphatic: No significant vascular findings are present. No enlarged abdominal or pelvic lymph nodes. Reproductive: Uterus and bilateral adnexa are unremarkable. Other: No abdominopelvic ascites. Fat containing umbilical hernia is present. Musculoskeletal: No acute osseous abnormality. IMPRESSION: 1. Findings compatible with acute pancreatitis. Multiple fluid collections are present in the pancreatic tail and left upper quadrant, slightly decreased in size from the prior exam. 2. Hepatic steatosis. Electronically Signed   By: Thornell Sartorius M.D.   On: 08/30/2022 04:36    Microbiology: Results for orders placed or performed during the hospital encounter of 06/23/22  Blood culture (routine x 2)     Status:  None   Collection Time: 06/23/22 11:20 PM   Specimen: BLOOD  Result Value Ref Range Status   Specimen Description   Final    BLOOD BLOOD LEFT ARM Performed at [facility], [address], [place], [place] [address]    Special Requests   Final    BOTTLES DRAWN AEROBIC AND ANAEROBIC Blood Culture adequate volume Performed at [facility], [address] [place], [place] [address]    Culture   Final    NO GROWTH 5 DAYS Performed at [facility], [address] [place], [place] [address]    Report Status 06/29/2022 FINAL  Final  Blood culture (routine x 2)     Status: None   Collection Time: 06/23/22 11:25 PM   Specimen: BLOOD  Result Value Ref Range Status   Specimen Description   Final    BLOOD BLOOD RIGHT  FOREARM Performed at [facility], [address] [place], [place] [address]    Special Requests   Final    BOTTLES DRAWN AEROBIC AND ANAEROBIC Blood Culture adequate volume Performed at [facility], [address], [place], [place] [address]    Culture   Final    NO GROWTH 5 DAYS Performed at [facility], [address], [place], [place] [address]    Report Status 06/29/2022 FINAL  Final  MRSA Next Gen by PCR, Nasal     Status: None   Collection Time: 06/24/22  9:07 AM   Specimen: Nasal Mucosa; Nasal Swab  Result Value Ref Range Status   MRSA by PCR Next Gen NOT DETECTED NOT DETECTED Final    Comment: (NOTE) The GeneXpert MRSA Assay (FDA approved for NASAL specimens only), is one component of a comprehensive MRSA colonization surveillance program. It is not intended to diagnose MRSA infection nor to guide or monitor treatment for MRSA infections. Test performance is not FDA approved in patients less than 22 years old. Performed at [facility], [address] [place], [place] [address]   Resp panel by RT-PCR (RSV, Flu A&B, Covid) Nasal Mucosa     Status:  None   Collection Time: 06/24/22  9:07 AM   Specimen: Nasal Mucosa; Nasal Swab  Result Value Ref Range Status   SARS Coronavirus 2 by RT PCR NEGATIVE NEGATIVE Final    Comment: (NOTE) SARS-CoV-2 target nucleic acids are NOT DETECTED.  The SARS-CoV-2 RNA is generally detectable in upper respiratory specimens during the acute phase of infection. The lowest concentration of SARS-CoV-2 viral copies this assay can detect is 138 copies/mL. A negative result does not preclude SARS-Cov-2 infection and should not be used as the sole basis for treatment or other patient management decisions. A negative result may occur with  improper specimen collection/handling, submission of specimen other than nasopharyngeal swab, presence of viral mutation(s) within the areas targeted by this assay, and inadequate number of viral copies(<138 copies/mL). A negative result must be combined with clinical observations, patient history, and epidemiological information. The expected result is Negative.  Fact Sheet for Patients:  BloggerCourse.com  Fact Sheet for Healthcare Providers:  SeriousBroker.it  This test is no t yet approved or cleared by the [place] FDA and  has been authorized for detection and/or diagnosis of SARS-CoV-2 by FDA under an Emergency Use Authorization (EUA). This EUA will remain  in effect (meaning this test can be used) for the duration of the COVID-19 declaration under Section 564(b)(1) of the Act, 21 U.S.C.section 360bbb-3(b)(1), unless the authorization is terminated  or revoked sooner.       Influenza A by PCR NEGATIVE NEGATIVE Final   Influenza B by PCR NEGATIVE NEGATIVE Final    Comment: (NOTE) The Xpert Xpress SARS-CoV-2/FLU/RSV plus assay is intended as an aid in the diagnosis of influenza from Nasopharyngeal swab specimens and should not be used as a sole basis for treatment. Nasal washings and aspirates are  unacceptable for Xpert Xpress SARS-CoV-2/FLU/RSV testing.  Fact Sheet for Patients: BloggerCourse.com  Fact Sheet for Healthcare Providers: SeriousBroker.it  This test is not yet approved or cleared by the [place] FDA and has been authorized for detection and/or diagnosis of SARS-CoV-2 by FDA under an Emergency Use Authorization (EUA). This EUA will remain in effect (meaning this test can be used) for the duration of the COVID-19 declaration under Section 564(b)(1) of the Act, 21 U.S.C. section 360bbb-3(b)(1),  unless the authorization is terminated or revoked.     Resp Syncytial Virus by PCR NEGATIVE NEGATIVE Final    Comment: (NOTE) Fact Sheet for Patients: BloggerCourse.com  Fact Sheet for Healthcare Providers: SeriousBroker.it  This test is not yet approved or cleared by the [place] FDA and has been authorized for detection and/or diagnosis of SARS-CoV-2 by FDA under an Emergency Use Authorization (EUA). This EUA will remain in effect (meaning this test can be used) for the duration of the COVID-19 declaration under Section 564(b)(1) of the Act, 21 U.S.C. section 360bbb-3(b)(1), unless the authorization is terminated or revoked.  Performed at [facility], [address], [place], [place] [address]     Labs: CBC: Recent Labs  Lab 08/30/22 0015 08/30/22 0602 08/31/22 0443 09/01/22 0417 09/02/22 0425  WBC 9.8 9.7 9.9 7.8 8.9  HGB 16.1* 14.0 13.0 12.9 13.0  HCT 44.9 39.5 37.8 37.7 37.8  MCV 90.2 91.4 93.8 95.0 95.7  PLT 305 262 224 215 213   Basic Metabolic Panel: Recent Labs  Lab 08/30/22 0015 08/30/22 0602 08/31/22 0443 09/01/22 0417 09/02/22 0425  NA 136 135 135 136 136  K 2.7* 3.0* 3.2* 2.9* 3.7  CL 99 101 100 99 103  CO2 26 26 28 27 26   GLUCOSE 118* 104* 86 73 99  BUN <5* <5* 6 5* <5*  CREATININE 0.50 0.45 0.46 0.45  0.55  CALCIUM 9.0 8.5* 8.7* 8.6* 8.7*  MG 1.7  --   --  1.9  --    Liver Function Tests: Recent Labs  Lab 08/30/22 0015 08/30/22 0602 08/31/22 0443 09/01/22 0417 09/02/22 0425  AST 100* 75* 53* 57* 112*  ALT 59* 47* 36 32 49*  ALKPHOS 216* 173* 135* 144* 172*  BILITOT 2.1* 2.0* 1.7* 1.8* 1.3*  PROT 8.5* 7.1 6.3* 6.6 6.6  ALBUMIN 3.5 3.0* 2.7* 2.7* 2.7*   CBG: Recent Labs  Lab 08/30/22 0825 09/01/22 0734  GLUCAP 101* 74    Discharge time spent: greater than 40 minutes.  Signed: Kendell Bane, MD [facility] 09/02/2022

## 2022-09-03 ENCOUNTER — Telehealth: Payer: Self-pay

## 2022-09-03 NOTE — Transitions of Care (Post Inpatient/ED Visit) (Signed)
   09/03/2022  Name: Veronica Calhoun MRN: [id number] DOB: 08/09/1985  Today's TOC FU Call Status: Today's TOC FU Call Status:: Successful TOC FU Call Competed TOC FU Call Complete Date: 09/03/22  Transition Care Management Follow-up Telephone Call Date of Discharge: 09/02/22 [facility] Type of Discharge: Inpatient Admission Primary Inpatient Discharge Diagnosis:: pancreatitis How have you been since you were released from the hospital?: Better Any questions or concerns?: No  Items Reviewed: Did you receive and understand the discharge instructions provided?: Yes Medications obtained,verified, and reconciled?: Yes (Medications Reviewed) Any new allergies since your discharge?: No Dietary orders reviewed?: Yes Do you have support at home?: Yes People in Home: significant other  Medications Reviewed Today: Medications Reviewed Today     Reviewed by Karena Addison, LPN (Licensed Practical Nurse) on 09/03/22 at 1027  Med List Status: <None>   Medication Order Taking? Sig Documenting Provider Last Dose Status Informant  acamprosate (CAMPRAL) 333 MG tablet [id number] Yes Take 2 tablets (666 mg total) by mouth 3 (three) times daily with meals. Kendell Bane, MD Taking Active   albuterol (VENTOLIN HFA) 108 (90 Base) MCG/ACT inhaler [id number] Yes Inhale 2 puffs into the lungs every 6 (six) hours as needed for wheezing or shortness of breath. Maurilio Lovely D, DO Taking Active Self  Aspirin-Salicylamide-Caffeine [facility] HEADACHE) 325-95-16 MG TABS [id number] Yes Take 1 Package by mouth daily as needed (for pain). [provider] Taking Active Self  cloNIDine (CATAPRES) 0.2 MG tablet [id number] Yes Take 1 tablet (0.2 mg total) by mouth 3 (three) times daily. Kendell Bane, MD Taking Active   metoprolol tartrate (LOPRESSOR) 50 MG tablet [id number] Yes Take 1 tablet (50 mg total) by mouth 2 (two) times daily. Kendell Bane, MD Taking Active   Multiple  Vitamin (MULTIVITAMIN WITH MINERALS) TABS tablet [id number] Yes Take 1 tablet by mouth daily. Kendell Bane, MD Taking Active   olmesartan-hydrochlorothiazide (BENICAR HCT) 40-12.5 MG tablet [id number] Yes Take 1 tablet by mouth daily. Kendell Bane, MD Taking Active   oxyCODONE (ROXICODONE) 5 MG immediate release tablet [id number] Yes Take 1 tablet (5 mg total) by mouth every 4 (four) hours as needed for up to 3 days for severe pain. Kendell Bane, MD Taking Active   pantoprazole (PROTONIX) 40 MG tablet [id number] Yes Take 1 tablet (40 mg total) by mouth 2 (two) times daily. Kendell Bane, MD Taking Active   thiamine (VITAMIN B-1) 100 MG tablet [id number] Yes Take 1 tablet (100 mg total) by mouth daily for 5 days. Kendell Bane, MD Taking Active             Home Care and Equipment/Supplies: Were Home Health Services Ordered?: NA Any new equipment or medical supplies ordered?: NA  Functional Questionnaire: Do you need assistance with bathing/showering or dressing?: No Do you need assistance with meal preparation?: No Do you need assistance with eating?: No Do you have difficulty maintaining continence: No Do you need assistance with getting out of bed/getting out of a chair/moving?: No Do you have difficulty managing or taking your medications?: No  Follow up appointments reviewed: PCP Follow-up appointment confirmed?: No (declined appt) MD Provider Line [phone] Given: No Specialist Hospital Follow-up appointment confirmed?: NA Do you need transportation to your follow-up appointment?: No Do you understand care options if your condition(s) worsen?: Yes-patient verbalized understanding    SIGNATURE Karena Addison, LPN [facility] Nurse Health Advisor Direct Dial [phone]

## 2022-09-09 LAB — ALDOSTERONE + RENIN ACTIVITY W/ RATIO

## 2022-09-25 ENCOUNTER — Telehealth: Payer: Medicaid Other | Admitting: Physician Assistant

## 2022-09-25 DIAGNOSIS — B3731 Acute candidiasis of vulva and vagina: Secondary | ICD-10-CM

## 2022-09-25 MED ORDER — FLUCONAZOLE 150 MG PO TABS
150.0000 mg | ORAL_TABLET | Freq: Once | ORAL | 0 refills | Status: AC
Start: 2022-09-25 — End: 2022-09-25

## 2022-09-25 NOTE — Progress Notes (Signed)
I have spent 5 minutes in review of e-visit questionnaire, review and updating patient chart, medical decision making and response to patient.   Maeci [employer]bfle[employer] Cody Paxtyn Boyar, PA-C    

## 2022-09-25 NOTE — Progress Notes (Signed)

## 2022-10-05 ENCOUNTER — Other Ambulatory Visit: Payer: Self-pay | Admitting: Family Medicine

## 2022-11-27 ENCOUNTER — Ambulatory Visit (INDEPENDENT_AMBULATORY_CARE_PROVIDER_SITE_OTHER): Payer: MEDICAID | Admitting: Family Medicine

## 2022-11-27 ENCOUNTER — Other Ambulatory Visit (HOSPITAL_COMMUNITY)
Admission: RE | Admit: 2022-11-27 | Discharge: 2022-11-27 | Disposition: A | Discharge status: Home / Self Care | Payer: MEDICAID | Source: Ambulatory Visit | Attending: Family Medicine | Admitting: Family Medicine

## 2022-11-27 ENCOUNTER — Encounter: Payer: Self-pay | Admitting: Family Medicine

## 2022-11-27 VITALS — BP 136/86 | HR 82 | Ht 66.0 in | Wt 221.0 lb

## 2022-11-27 DIAGNOSIS — I10 Essential (primary) hypertension: Secondary | ICD-10-CM | POA: Diagnosis not present

## 2022-11-27 DIAGNOSIS — Z23 Encounter for immunization: Secondary | ICD-10-CM

## 2022-11-27 DIAGNOSIS — Z124 Encounter for screening for malignant neoplasm of cervix: Secondary | ICD-10-CM | POA: Diagnosis not present

## 2022-11-27 DIAGNOSIS — Z13220 Encounter for screening for lipoid disorders: Secondary | ICD-10-CM

## 2022-11-27 DIAGNOSIS — G479 Sleep disorder, unspecified: Secondary | ICD-10-CM

## 2022-11-27 DIAGNOSIS — E785 Hyperlipidemia, unspecified: Secondary | ICD-10-CM | POA: Insufficient documentation

## 2022-11-27 DIAGNOSIS — M79605 Pain in left leg: Secondary | ICD-10-CM | POA: Diagnosis not present

## 2022-11-27 DIAGNOSIS — E782 Mixed hyperlipidemia: Secondary | ICD-10-CM

## 2022-11-27 DIAGNOSIS — Z01411 Encounter for gynecological examination (general) (routine) with abnormal findings: Secondary | ICD-10-CM | POA: Diagnosis not present

## 2022-11-27 MED ORDER — METOPROLOL TARTRATE 50 MG PO TABS: 50.0000 mg | ORAL_TABLET | Freq: Two times a day (BID) | ORAL | 3 refills | Status: DC

## 2022-11-27 MED ORDER — BLOOD PRESSURE KIT DEVI: 0 refills | Status: DC

## 2022-11-27 MED ORDER — ROSUVASTATIN CALCIUM 5 MG PO TABS
5.0000 mg | ORAL_TABLET | Freq: Every day | ORAL | 1 refills | Status: DC
Start: 2022-11-27 — End: 2023-05-21

## 2022-11-27 MED ORDER — CLONIDINE HCL 0.2 MG PO TABS: 0.2000 mg | ORAL_TABLET | Freq: Three times a day (TID) | ORAL | 3 refills | Status: DC

## 2022-11-27 MED ORDER — GABAPENTIN 100 MG PO CAPS
100.0000 mg | ORAL_CAPSULE | Freq: Three times a day (TID) | ORAL | 2 refills | Status: DC
Start: 2022-11-27 — End: 2023-03-20

## 2022-11-27 MED ORDER — HYDROXYZINE PAMOATE 25 MG PO CAPS
25.0000 mg | ORAL_CAPSULE | Freq: Every evening | ORAL | 0 refills | Status: DC
Start: 2022-11-27 — End: 2022-12-31

## 2022-11-27 MED ORDER — OLMESARTAN MEDOXOMIL-HCTZ 40-12.5 MG PO TABS
1.0000 | ORAL_TABLET | Freq: Every day | ORAL | 1 refills | Status: DC
Start: 2022-11-27 — End: 2023-06-10

## 2022-11-27 NOTE — Assessment & Plan Note (Addendum)
Associated with numbness and tingling Possible peripheral neuropathy pain Labs ordered - awaiting results will follow up. Gabapentin 100 mg 3 times daily as needed PRN

## 2022-11-27 NOTE — Assessment & Plan Note (Signed)
Pap smear done, Patient tolerated procedure well. Informed patient I will keep them updated on results. Breasts appear normal, no suspicious masses, no skin or nipple changes or axillary nodes, right breast normal without mass, skin or nipple changes or axillary nodes, left breast normal without mass, skin or nipple changes or axillary nodes.

## 2022-11-27 NOTE — Assessment & Plan Note (Signed)
Trial on Hydroxyzine 25 mg nightly  Explained to go to bed at the same time each night and get up at the same time each morning, including on the weekends. Make sure your bedroom is quiet, dark, relaxing, and at a comfortable temperature. Remove electronic devices, such as TVs, computers, and smart phones, from the bedroom.

## 2022-11-27 NOTE — Progress Notes (Signed)
Patient Office Visit   Subjective   Patient ID: Veronica Calhoun, female    DOB: 1985/06/18  Age: 37 y.o. MRN: [id number]  CC:  Chief Complaint  Patient presents with   Gynecologic Exam    Pap today   Leg Pain    Pt reports left leg pain/ numbness ongoing for about 3 months now.    Hypertension    Would like to discuss htn, bluriness in vision.     HPI Veronica Calhoun 37 year old female, presents to the clinic for pap smear, difficulty sleeping  and left leg pain She  has a past medical history of Acute pancreatitis (12/29/2021), Alcohol abuse (06/24/2022), Alcohol induced acute pancreatitis (06/10/2022), Alcohol use disorder, severe, dependence (HCC) (05/20/2018), Alcoholic hepatitis without ascites (06/19/2022), Anasarca (06/20/2022), Anxiety (2013), Generalized anxiety disorder (04/01/2013), History of hypertension (11/15/2021), Hypertension (2014), Pancreatitis, and Warts, genital (07/21/2019).    Left Leg Pain  There was no injury mechanism. The pain is present in the left leg. The quality of the pain is described as aching ,numbness. The pain is at a severity of 6/10. The pain has been constant since onset. Associated symptoms include numbness. Pertinent negatives include no inability to bear weight, loss of motion, loss of sensation, muscle weakness or tingling. She reports no foreign bodies present. The symptoms are aggravated by movement and weight bearing. She has tried acetaminophen for the symptoms. The treatment provided no relief.   Insomnia Primary symptoms: sleep disturbance, difficulty falling asleep, frequent awakening. The problem occurs nightly. The problem has been gradually worsening since onset. The symptoms are aggravated by anxiety. How many beverages per day that contain caffeine: 0 - 1.  Types of beverages you drink: soda and coffee. The symptoms are relieved by darkened room and quiet environment. Treatments tried: Tylenol PM and Melatonin. The treatment  provided no relief. Typical bedtime:  10-11 P.M..  How long after going to bed to you fall asleep: over an hour.   PMH includes: hypertension, family stress or anxiety.         Outpatient Encounter Medications as of 11/27/2022  Medication Sig   albuterol (VENTOLIN HFA) 108 (90 Base) MCG/ACT inhaler Inhale 2 puffs into the lungs every 6 (six) hours as needed for wheezing or shortness of breath.   Aspirin-Salicylamide-Caffeine (BC HEADACHE) 325-95-16 MG TABS Take 1 Package by mouth daily as needed (for pain).   Blood Pressure Monitoring (BLOOD PRESSURE KIT) DEVI Take blood pressure reading 2 times daily.   gabapentin (NEURONTIN) 100 MG capsule Take 1 capsule (100 mg total) by mouth 3 (three) times daily.   hydrOXYzine (VISTARIL) 25 MG capsule Take 1 capsule (25 mg total) by mouth at bedtime.   rosuvastatin (CRESTOR) 5 MG tablet Take 1 tablet (5 mg total) by mouth daily.   [DISCONTINUED] cloNIDine (CATAPRES) 0.2 MG tablet Take 1 tablet (0.2 mg total) by mouth 3 (three) times daily.   [DISCONTINUED] metoprolol tartrate (LOPRESSOR) 50 MG tablet Take 1 tablet (50 mg total) by mouth 2 (two) times daily.   [DISCONTINUED] olmesartan-hydrochlorothiazide (BENICAR HCT) 40-12.5 MG tablet Take 1 tablet by mouth daily.   cloNIDine (CATAPRES) 0.2 MG tablet Take 1 tablet (0.2 mg total) by mouth 3 (three) times daily.   metoprolol tartrate (LOPRESSOR) 50 MG tablet Take 1 tablet (50 mg total) by mouth 2 (two) times daily.   olmesartan-hydrochlorothiazide (BENICAR HCT) 40-12.5 MG tablet Take 1 tablet by mouth daily.   pantoprazole (PROTONIX) 40 MG tablet Take 1 tablet (40 mg  total) by mouth 2 (two) times daily.   No facility-administered encounter medications on file as of 11/27/2022.    Past Surgical History:  Procedure Laterality Date   CESAREAN SECTION  03/18/2007    Review of Systems  Constitutional:  Negative for chills and fever.  Eyes:  Positive for blurred vision.  Respiratory:  Negative for  shortness of breath.   Cardiovascular:  Negative for chest pain.  Gastrointestinal:  Negative for abdominal pain.  Genitourinary:  Negative for dysuria, flank pain, frequency, hematuria and urgency.  Musculoskeletal:  Positive for myalgias.  Neurological:  Negative for dizziness.  Psychiatric/Behavioral:  The patient has insomnia.       Objective    BP 136/86   Pulse 82   Ht 5\' 6"  (1.676 m)   Wt 221 lb 0.6 oz (100.3 kg)   SpO2 97%   BMI 35.68 kg/m   Physical Exam Vitals reviewed.  Constitutional:      General: She is not in acute distress.    Appearance: Normal appearance. She is not ill-appearing, toxic-appearing or diaphoretic.  HENT:     Head: Normocephalic.  Eyes:     General:        Right eye: No discharge.        Left eye: No discharge.     Conjunctiva/sclera: Conjunctivae normal.  Cardiovascular:     Rate and Rhythm: Normal rate.     Pulses: Normal pulses.     Heart sounds: Normal heart sounds.  Pulmonary:     Effort: Pulmonary effort is normal. No respiratory distress.     Breath sounds: Normal breath sounds.  Genitourinary:    General: Normal vulva.     Exam position: Lithotomy position.     Pubic Area: No rash or pubic lice.      Labia:        Right: No rash.        Left: No rash.      Urethra: No urethral pain.     Vagina: Normal.     Cervix: Normal.     Rectum: Normal.  Musculoskeletal:        General: No swelling. Normal range of motion.     Cervical back: Normal range of motion.     Right lower leg: No edema.     Left lower leg: No edema.  Skin:    General: Skin is warm and dry.     Capillary Refill: Capillary refill takes less than 2 seconds.  Neurological:     General: No focal deficit present.     Mental Status: She is alert and oriented to person, place, and time.     Coordination: Coordination normal.     Gait: Gait normal.  Psychiatric:        Mood and Affect: Mood normal.        Behavior: Behavior normal.       Assessment &  Plan:  Cervical cancer screening Assessment & Plan: Pap smear done, Patient tolerated procedure well. Informed patient I will keep them updated on results. Breasts appear normal, no suspicious masses, no skin or nipple changes or axillary nodes, right breast normal without mass, skin or nipple changes or axillary nodes, left breast normal without mass, skin or nipple changes or axillary nodes.  Orders: -     Cytology - PAP  Need for Tdap vaccination  Immunization due -     Tdap vaccine greater than or equal to 7yo IM  Left leg pain Assessment &  Plan: Associated with numbness and tingling Possible peripheral neuropathy pain Labs ordered - awaiting results will follow up. Gabapentin 100 mg 3 times daily as needed PRN  Orders: -     Vitamin B1  Pain of left lower extremity -     BMP8+eGFR -     VITAMIN D 25 Hydroxy (Vit-D Deficiency, Fractures) -     Vitamin B12 -     Iron, TIBC and Ferritin Panel -     Gabapentin; Take 1 capsule (100 mg total) by mouth 3 (three) times daily.  Dispense: 90 capsule; Refill: 2  Screening for lipid disorders -     Lipid panel -     CBC with Differential/Platelet  Primary hypertension -     Olmesartan Medoxomil-HCTZ; Take 1 tablet by mouth daily.  Dispense: 90 tablet; Refill: 1  Difficulty sleeping Assessment & Plan: Trial on Hydroxyzine 25 mg nightly  Explained to go to bed at the same time each night and get up at the same time each morning, including on the weekends. Make sure your bedroom is quiet, dark, relaxing, and at a comfortable temperature. Remove electronic devices, such as TVs, computers, and smart phones, from the bedroom.   Orders: -     hydrOXYzine Pamoate; Take 1 capsule (25 mg total) by mouth at bedtime.  Dispense: 30 capsule; Refill: 0  Mixed hyperlipidemia Assessment & Plan: Last LDL reading 149 on 07/2022 Lipid panel ordered today Started Crestor 5 mg once daily Discussed lifestyle modifications follow diet low in  saturated fat, reduce dietary salt intake, avoid fatty foods, maintain an exercise routine 3 to 5 days a week for a minimum total of 150 minutes.   Orders: -     Rosuvastatin Calcium; Take 1 tablet (5 mg total) by mouth daily.  Dispense: 90 tablet; Refill: 1  Essential hypertension Assessment & Plan: Vitals:   11/27/22 0809  BP: 136/86  Labs ordered in today's visit Refilled clonidine, olmesartan, HCTZ and metoprolol.  Continued discussion on DASH diet, low sodium diet and maintain a exercise routine for 150 minutes per week.    Other orders -     cloNIDine HCl; Take 1 tablet (0.2 mg total) by mouth 3 (three) times daily.  Dispense: 90 tablet; Refill: 3 -     Metoprolol Tartrate; Take 1 tablet (50 mg total) by mouth 2 (two) times daily.  Dispense: 90 tablet; Refill: 3 -     Blood Pressure Kit; Take blood pressure reading 2 times daily.  Dispense: 1 each; Refill: 0    Return in about 3 months (around 02/27/2023), or if symptoms worsen or fail to improve, for hypertension, hyperlipidemia.   Veronica Lederer Newman Nip, Veronica Calhoun

## 2022-11-27 NOTE — Assessment & Plan Note (Signed)
Last LDL reading 149 on 07/2022 Lipid panel ordered today Started Crestor 5 mg once daily Discussed lifestyle modifications follow diet low in saturated fat, reduce dietary salt intake, avoid fatty foods, maintain an exercise routine 3 to 5 days a week for a minimum total of 150 minutes.

## 2022-11-27 NOTE — Patient Instructions (Signed)

## 2022-11-27 NOTE — Assessment & Plan Note (Signed)
Vitals:   11/27/22 0809  BP: 136/86  Labs ordered in today's visit Refilled clonidine, olmesartan, HCTZ and metoprolol.  Continued discussion on DASH diet, low sodium diet and maintain a exercise routine for 150 minutes per week.

## 2022-12-01 ENCOUNTER — Encounter: Payer: Self-pay | Admitting: Family Medicine

## 2022-12-09 ENCOUNTER — Encounter: Payer: Self-pay | Admitting: Family Medicine

## 2022-12-23 ENCOUNTER — Telehealth: Payer: Self-pay | Admitting: Family Medicine

## 2022-12-23 ENCOUNTER — Encounter: Payer: Self-pay | Admitting: Family Medicine

## 2022-12-23 ENCOUNTER — Other Ambulatory Visit: Payer: Self-pay

## 2022-12-23 DIAGNOSIS — I10 Essential (primary) hypertension: Secondary | ICD-10-CM

## 2022-12-23 MED ORDER — BLOOD PRESSURE KIT DEVI
0 refills | Status: AC
Start: 2022-12-23 — End: ?

## 2022-12-23 NOTE — Telephone Encounter (Signed)
error 

## 2022-12-23 NOTE — Telephone Encounter (Signed)
Patient calling our office has not heard back from anyone from our office about the blood pressure cuff. Insurnace will not cover at her [facility] pharmacy asking to be sent to [facility] not received anything yet.   Also has questions on the medication that has been prescribe, could it make her periods stop. Ask for a nurse to return call at [phone].

## 2022-12-29 ENCOUNTER — Telehealth: Payer: Self-pay | Admitting: Family Medicine

## 2022-12-29 NOTE — Telephone Encounter (Signed)
Patient called in LVM in regard to missed period for the entire month. Wants a cll back in regard. Has questions. Wants to know if maybe her medicine is causing missed  period Patient has taken multiple pregnancy test with negative results   Wants to see if can do blood test to check for pregnancy. Wants a call back in regard at earliest convenience

## 2022-12-30 ENCOUNTER — Other Ambulatory Visit: Payer: Self-pay

## 2022-12-30 ENCOUNTER — Other Ambulatory Visit: Payer: Self-pay | Admitting: Family Medicine

## 2022-12-30 DIAGNOSIS — N926 Irregular menstruation, unspecified: Secondary | ICD-10-CM

## 2022-12-30 DIAGNOSIS — Z3200 Encounter for pregnancy test, result unknown: Secondary | ICD-10-CM

## 2022-12-30 NOTE — Telephone Encounter (Signed)
Order is in patient can come in for lab only

## 2022-12-30 NOTE — Telephone Encounter (Signed)
Lvm that she can come get the lab today and it is non fasting

## 2022-12-30 NOTE — Telephone Encounter (Signed)
Please advise. Do you want to order a blood HCG?

## 2022-12-31 ENCOUNTER — Telehealth: Payer: Self-pay | Admitting: Family Medicine

## 2022-12-31 ENCOUNTER — Other Ambulatory Visit: Payer: Self-pay | Admitting: Family Medicine

## 2022-12-31 DIAGNOSIS — G479 Sleep disorder, unspecified: Secondary | ICD-10-CM

## 2022-12-31 MED ORDER — HYDROXYZINE PAMOATE 25 MG PO CAPS
25.0000 mg | ORAL_CAPSULE | Freq: Every evening | ORAL | 0 refills | Status: AC
Start: 2022-12-31 — End: ?

## 2022-12-31 NOTE — Telephone Encounter (Signed)
Patient called does not see no test results on her pregnancy test in [place], asked for a nurse to return her call [phone]

## 2022-12-31 NOTE — Telephone Encounter (Signed)
Sent mychart message that poc preg test was negative

## 2023-01-02 ENCOUNTER — Encounter: Payer: Self-pay | Admitting: Family Medicine

## 2023-01-07 LAB — PREGNANCY, URINE: Preg Test, Ur: NEGATIVE

## 2023-02-01 ENCOUNTER — Other Ambulatory Visit: Payer: Self-pay | Admitting: Family Medicine

## 2023-02-01 DIAGNOSIS — G479 Sleep disorder, unspecified: Secondary | ICD-10-CM

## 2023-02-02 MED ORDER — HYDROXYZINE PAMOATE 25 MG PO CAPS
25.0000 mg | ORAL_CAPSULE | Freq: Every evening | ORAL | 0 refills | Status: DC
Start: 2023-02-02 — End: 2023-03-03

## 2023-02-26 ENCOUNTER — Ambulatory Visit: Payer: MEDICAID | Admitting: Family Medicine

## 2023-02-27 ENCOUNTER — Other Ambulatory Visit: Payer: Self-pay | Admitting: Family Medicine

## 2023-02-27 DIAGNOSIS — G479 Sleep disorder, unspecified: Secondary | ICD-10-CM

## 2023-03-02 ENCOUNTER — Other Ambulatory Visit: Payer: Self-pay | Admitting: Family Medicine

## 2023-03-02 DIAGNOSIS — G479 Sleep disorder, unspecified: Secondary | ICD-10-CM

## 2023-03-19 NOTE — Progress Notes (Signed)
Established Patient Office Visit   Subjective  Patient ID: Veronica Calhoun, female    DOB: [birth year]  Age: 37 y.o. MRN: [id number]  Chief Complaint  Patient presents with   Care Management    Pt reports bp readings reading have been 140's/90's, would like lab results. Would like to discuss weight medication.    Dental Pain    Pt reports tooth pain, thinks its infected is looking for a dentist.     She  has a past medical history of Acute pancreatitis (12/29/2021), Alcohol abuse (06/24/2022), Alcohol induced acute pancreatitis (06/10/2022), Alcohol use disorder, severe, dependence (HCC) (05/20/2018), Alcoholic hepatitis without ascites (06/19/2022), Anasarca (06/20/2022), Anxiety (2013), Generalized anxiety disorder (04/01/2013), History of hypertension (11/15/2021), Hypertension (2014), Pancreatitis, and Warts, genital (07/21/2019).  HPI Patient presents to the clinic for chronic follow up. For the details of today's visit, please refer to assessment and plan.   Review of Systems  Constitutional:  Negative for chills and fever.  Respiratory:  Negative for shortness of breath.   Cardiovascular:  Negative for chest pain.  Neurological:  Negative for dizziness and headaches.      Objective:     BP 132/86   Pulse 83   Ht 5\' 6"  (1.676 m)   Wt 242 lb 1.9 oz (109.8 kg)   SpO2 96%   BMI 39.08 kg/m  BP Readings from Last 3 Encounters:  03/20/23 132/86  11/27/22 136/86  09/02/22 (!) 162/112      Physical Exam Vitals reviewed.  Constitutional:      General: She is not in acute distress.    Appearance: Normal appearance. She is not ill-appearing, toxic-appearing or diaphoretic.  HENT:     Head: Normocephalic.  Eyes:     General:        Right eye: No discharge.        Left eye: No discharge.     Conjunctiva/sclera: Conjunctivae normal.  Cardiovascular:     Rate and Rhythm: Normal rate.     Pulses: Normal pulses.     Heart sounds: Normal heart sounds.  Pulmonary:      Effort: Pulmonary effort is normal. No respiratory distress.     Breath sounds: Normal breath sounds.  Abdominal:     General: Bowel sounds are normal.     Palpations: Abdomen is soft.     Tenderness: There is no abdominal tenderness. There is no guarding.  Musculoskeletal:        General: Normal range of motion.     Cervical back: Normal range of motion.  Skin:    General: Skin is warm and dry.     Capillary Refill: Capillary refill takes less than 2 seconds.  Neurological:     Mental Status: She is alert.     Coordination: Coordination normal.     Gait: Gait normal.  Psychiatric:        Mood and Affect: Mood normal.        Behavior: Behavior normal.      No results found for any visits on 03/20/23.  The ASCVD Risk score (Arnett DK, et al., 2019) failed to calculate for the following reasons:   The 2019 ASCVD risk score is only valid for ages 71 to 56    Assessment & Plan:  Primary hypertension -     CBC with Differential/Platelet -     Lipid panel -     Metoprolol Succinate ER; Take 1 tablet (50 mg total) by mouth daily. Take with or  immediately following a meal.  Dispense: 90 tablet; Refill: 3 -     CMP14+EGFR  Elevated alkaline phosphatase level -     Gamma GT  Essential hypertension Assessment & Plan: Vitals:   03/20/23 0817  BP: 132/86    Discontinued metoprolol tartrate and initiated metoprolol succinate for improved adherence; patient educated on once-daily dosing and potential side effects, with follow-up scheduled in 2-4 weeks to reassess at home blood pressure readings and heart rate via my chart  Continue Olmesartan-hydrochlorothiazide 40-12.5 mg and Clonidine 0.2 mg twice daily   Labs ordered. Discussed with  patient to monitor their blood pressure regularly and maintain a heart-healthy diet rich in fruits, vegetables, whole grains, and low-fat dairy, while reducing sodium intake to less than 2,300 mg per day. Regular physical activity, such as 30  minutes of moderate exercise most days of the week, will help lower blood pressure and improve overall cardiovascular health. Avoiding smoking, limiting alcohol consumption, and managing stress. Take  prescribed medication, & take it as directed and avoid skipping doses. Seek emergency care if your blood pressure is (over 180/100) or you experience chest pain, shortness of breath, or sudden vision changes.Patient verbalizes understanding regarding plan of care and all questions answered. .    Obesity, Class II, BMI 35-39.9 Assessment & Plan: Discussed the importance to start eating 3 meals a day including breakfast, drink 8 glasses of water a day ,reduce portion sizes. reduced carbohydrates limit saturated and trans fat, increase servings of vegetables and limit processed foods. Find an activity that you will enjoy and start to be active at least 5 days a week for 30 minutes each day. Keep a food journal or an activity journal to identify triggers that lead to emotional eating Follow up in 4 weeks for Weight loss management     Tooth pain Assessment & Plan: Patient presents with complaints of dental pain localized to the lower left molar. On examination, findings include localized swelling, erythema of the gingiva, tenderness to palpation. Treatment initiated with amoxicillin 500 mg TID for 7 days; patient advised to seek follow-up with a dentist for definitive care.   Other orders -     Amoxicillin; Take 1 capsule (500 mg total) by mouth 2 (two) times daily for 7 days.  Dispense: 14 capsule; Refill: 0 -     Gabapentin; Take 1 capsule (300 mg total) by mouth 3 (three) times daily.  Dispense: 90 capsule; Refill: 3    Return in about 5 months (around 08/18/2023), or if symptoms worsen or fail to improve.   Cruzita Lederer Newman Nip, FNP

## 2023-03-19 NOTE — Patient Instructions (Addendum)
        Great to see you today.  I have refilled the medication(s) we provide.    Current approved weight loss injections include:  Wegovy   Zepbound (tirzepatide)  Saxenda (liraglutide)  Plan B: Metformin 500 mg once daily for Obesity     If labs were collected, we will inform you of lab results once received either by echart message or telephone call.   - echart message- for normal results that have been seen by the patient already.   - telephone call: abnormal results or if patient has not viewed results in their echart.   - Please take medications as prescribed. - Follow up with your primary health provider if any health concerns arises. - If symptoms worsen please contact your primary care provider and/or visit the emergency department.

## 2023-03-20 ENCOUNTER — Encounter: Payer: Self-pay | Admitting: Family Medicine

## 2023-03-20 ENCOUNTER — Ambulatory Visit: Payer: MEDICAID | Admitting: Family Medicine

## 2023-03-20 VITALS — BP 132/86 | HR 83 | Ht 66.0 in | Wt 242.1 lb

## 2023-03-20 DIAGNOSIS — K0889 Other specified disorders of teeth and supporting structures: Secondary | ICD-10-CM

## 2023-03-20 DIAGNOSIS — R748 Abnormal levels of other serum enzymes: Secondary | ICD-10-CM

## 2023-03-20 DIAGNOSIS — E66812 Obesity, class 2: Secondary | ICD-10-CM

## 2023-03-20 DIAGNOSIS — I10 Essential (primary) hypertension: Secondary | ICD-10-CM | POA: Diagnosis not present

## 2023-03-20 DIAGNOSIS — Z23 Encounter for immunization: Secondary | ICD-10-CM | POA: Diagnosis not present

## 2023-03-20 MED ORDER — AMOXICILLIN 500 MG PO CAPS: 500.0000 mg | ORAL_CAPSULE | Freq: Two times a day (BID) | ORAL | 0 refills | Status: AC

## 2023-03-20 MED ORDER — GABAPENTIN 300 MG PO CAPS: 300.0000 mg | ORAL_CAPSULE | Freq: Three times a day (TID) | ORAL | 3 refills | Status: DC

## 2023-03-20 MED ORDER — METOPROLOL SUCCINATE ER 50 MG PO TB24
50.0000 mg | ORAL_TABLET | Freq: Every day | ORAL | 3 refills | Status: DC
Start: 2023-03-20 — End: 2024-03-15

## 2023-03-20 NOTE — Assessment & Plan Note (Signed)
Patient presents with complaints of dental pain localized to the lower left molar. On examination, findings include localized swelling, erythema of the gingiva, tenderness to palpation. Treatment initiated with amoxicillin 500 mg TID for 7 days; patient advised to seek follow-up with a dentist for definitive care.

## 2023-03-20 NOTE — Assessment & Plan Note (Signed)
Vitals:   03/20/23 0817  BP: 132/86    Discontinued metoprolol tartrate and initiated metoprolol succinate for improved adherence; patient educated on once-daily dosing and potential side effects, with follow-up scheduled in 2-4 weeks to reassess at home blood pressure readings and heart rate via my chart  Continue Olmesartan-hydrochlorothiazide 40-12.5 mg and Clonidine 0.2 mg twice daily   Labs ordered. Discussed with  patient to monitor their blood pressure regularly and maintain a heart-healthy diet rich in fruits, vegetables, whole grains, and low-fat dairy, while reducing sodium intake to less than 2,300 mg per day. Regular physical activity, such as 30 minutes of moderate exercise most days of the week, will help lower blood pressure and improve overall cardiovascular health. Avoiding smoking, limiting alcohol consumption, and managing stress. Take  prescribed medication, & take it as directed and avoid skipping doses. Seek emergency care if your blood pressure is (over 180/100) or you experience chest pain, shortness of breath, or sudden vision changes.Patient verbalizes understanding regarding plan of care and all questions answered. Marland Kitchen

## 2023-03-20 NOTE — Assessment & Plan Note (Signed)
Discussed the importance to start eating 3 meals a day including breakfast, drink 8 glasses of water a day ,reduce portion sizes. reduced carbohydrates limit saturated and trans fat, increase servings of vegetables and limit processed foods. Find an activity that you will enjoy and start to be active at least 5 days a week for 30 minutes each day. Keep a food journal or an activity journal to identify triggers that lead to emotional eating Follow up in 4 weeks for Weight loss management   

## 2023-03-21 ENCOUNTER — Encounter: Payer: Self-pay | Admitting: Family Medicine

## 2023-03-21 LAB — CBC WITH DIFFERENTIAL/PLATELET
Basophils Absolute: 0.1 10*3/uL (ref 0.0–0.2)
Basos: 1 %
EOS (ABSOLUTE): 0.3 10*3/uL (ref 0.0–0.4)
Eos: 4 %
Hematocrit: 37.3 % (ref 34.0–46.6)
Hemoglobin: 12.6 g/dL (ref 11.1–15.9)
Immature Grans (Abs): 0 10*3/uL (ref 0.0–0.1)
Immature Granulocytes: 0 %
Lymphocytes Absolute: 3 10*3/uL (ref 0.7–3.1)
Lymphs: 37 %
MCH: 30 pg (ref 26.6–33.0)
MCHC: 33.8 g/dL (ref 31.5–35.7)
MCV: 89 fL (ref 79–97)
Monocytes Absolute: 0.7 10*3/uL (ref 0.1–0.9)
Monocytes: 9 %
Neutrophils Absolute: 4 10*3/uL (ref 1.4–7.0)
Neutrophils: 49 %
Platelets: 281 10*3/uL (ref 150–450)
RBC: 4.2 x10E6/uL (ref 3.77–5.28)
RDW: 12.4 % (ref 11.7–15.4)
WBC: 8.1 10*3/uL (ref 3.4–10.8)

## 2023-03-21 LAB — CMP14+EGFR
ALT: 23 [IU]/L (ref 0–32)
AST: 21 [IU]/L (ref 0–40)
Albumin: 4.5 g/dL (ref 3.9–4.9)
Alkaline Phosphatase: 109 [IU]/L (ref 44–121)
BUN/Creatinine Ratio: 17 (ref 9–23)
BUN: 16 mg/dL (ref 6–20)
Bilirubin Total: 0.2 mg/dL (ref 0.0–1.2)
CO2: 20 mmol/L (ref 20–29)
Calcium: 9.6 mg/dL (ref 8.7–10.2)
Chloride: 104 mmol/L (ref 96–106)
Creatinine, Ser: 0.96 mg/dL (ref 0.57–1.00)
Globulin, Total: 3.1 g/dL (ref 1.5–4.5)
Glucose: 105 mg/dL — ABNORMAL HIGH (ref 70–99)
Potassium: 4.7 mmol/L (ref 3.5–5.2)
Sodium: 139 mmol/L (ref 134–144)
Total Protein: 7.6 g/dL (ref 6.0–8.5)
eGFR: 79 mL/min/{1.73_m2} (ref >59)

## 2023-03-21 LAB — LIPID PANEL
Chol/HDL Ratio: 3.7 ratio (ref 0.0–4.4)
Cholesterol, Total: 153 mg/dL (ref 100–199)
HDL: 41 mg/dL (ref >39)
LDL Chol Calc (NIH): 98 mg/dL (ref 0–99)
Triglycerides: 72 mg/dL (ref 0–149)
VLDL Cholesterol Cal: 14 mg/dL (ref 5–40)

## 2023-03-21 LAB — GAMMA GT: GGT: 188 [IU]/L — ABNORMAL HIGH (ref 0–60)

## 2023-03-22 ENCOUNTER — Other Ambulatory Visit: Payer: Self-pay | Admitting: Family Medicine

## 2023-03-22 DIAGNOSIS — R748 Abnormal levels of other serum enzymes: Secondary | ICD-10-CM

## 2023-03-23 NOTE — Telephone Encounter (Signed)
Take it 3 times daily

## 2023-03-24 NOTE — Telephone Encounter (Signed)
Needs a office visit for weight loss

## 2023-03-24 NOTE — Telephone Encounter (Signed)
Pt. Informed but is still worried her pressure is to low it has been running 110/65 since starting the three times daily. She will continue to monitor and inform us if it drops anymore.

## 2023-03-24 NOTE — Telephone Encounter (Signed)
Switch to two times daily and follow up in 2 weeks with blood pressure readings

## 2023-03-25 NOTE — Telephone Encounter (Signed)
Called no answer, lvm

## 2023-03-26 ENCOUNTER — Telehealth: Payer: Self-pay | Admitting: Family Medicine

## 2023-03-26 NOTE — Telephone Encounter (Signed)
Patient returning nurse call about labs.

## 2023-03-26 NOTE — Telephone Encounter (Signed)
Pt. Stated she is currently ranging 118/83 while on the medication 3 x daily. She will follow up in 2 weeks thru mychart for reading check.

## 2023-03-26 NOTE — Telephone Encounter (Signed)
Pt. Informed of needed appt.

## 2023-03-31 ENCOUNTER — Ambulatory Visit (HOSPITAL_COMMUNITY): Payer: MEDICAID

## 2023-04-01 ENCOUNTER — Encounter: Payer: Self-pay | Admitting: Family Medicine

## 2023-04-01 ENCOUNTER — Ambulatory Visit (HOSPITAL_COMMUNITY)
Admission: RE | Admit: 2023-04-01 | Discharge: 2023-04-01 | Disposition: A | Discharge status: Home / Self Care | Payer: MEDICAID | Source: Ambulatory Visit | Attending: Family Medicine | Admitting: Family Medicine

## 2023-04-01 ENCOUNTER — Other Ambulatory Visit: Payer: Self-pay | Admitting: Family Medicine

## 2023-04-01 DIAGNOSIS — R748 Abnormal levels of other serum enzymes: Secondary | ICD-10-CM | POA: Diagnosis present

## 2023-04-01 DIAGNOSIS — G479 Sleep disorder, unspecified: Secondary | ICD-10-CM

## 2023-04-01 MED ORDER — HYDROXYZINE PAMOATE 25 MG PO CAPS
25.0000 mg | ORAL_CAPSULE | Freq: Every day | ORAL | 0 refills | Status: DC
Start: 2023-04-01 — End: 2023-04-27

## 2023-04-01 NOTE — Telephone Encounter (Signed)
Is the PA pending ?    Please infom patient:  Diet to Lower Cholesterol Eat More: Oats, beans, and lentils: High in soluble fiber. Fatty fish: Salmon, tuna (rich in omega-3s). Nuts and seeds: Almonds, walnuts, flaxseeds. Fruits and vegetables: Apples, berries, leafy greens. Healthy fats: Olive oil, avocado. Limit: Saturated fats: Butter, cream, fatty meats. Trans fats: Fried foods, processed snacks. Sugar and refined carbs: Sweets, white bread. Focus on whole foods, healthy fats, and fiber to improve heart health! Maintain an exercise routine 3 to 5 days a week for a minimum total of 150 minutes.

## 2023-04-06 NOTE — Telephone Encounter (Signed)
Yes you have a fatty liver:  Fatty liver, also known as hepatic steatosis, means there is an excess buildup of fat in your liver cells. The liver normally contains a small amount of fat, but when the fat level increases beyond normal  it can interfere with the liver's ability to function properly.

## 2023-04-06 NOTE — Telephone Encounter (Signed)
0.25 mg weekly its wegovy

## 2023-04-06 NOTE — Telephone Encounter (Signed)
Ok. I will send it in. Also she has been informed of the diet restrictions.

## 2023-04-06 NOTE — Telephone Encounter (Signed)
PA has been started

## 2023-04-06 NOTE — Telephone Encounter (Signed)
Before PA can be started please prescribe what dosage of Ozempic you would like her to be on.

## 2023-04-14 ENCOUNTER — Ambulatory Visit: Payer: Self-pay | Admitting: Family Medicine

## 2023-04-14 NOTE — Telephone Encounter (Signed)
Copied from CRM [id number]. Topic: Clinical - Red Word Triage >> Apr 14, 2023 12:25 PM Danika B wrote: Red Word that prompted transfer to Nurse Triage: Blood pressure high, was told to call if elevated.    Chief Complaint: Low BP Symptoms: BP 100/85 and 105/85, felt light headed and dizzy yesterday Frequency: started running low yesterday Pertinent Negatives: Patient denies low HR Disposition: [] ED /[] Urgent Care (no appt availability in office) / [] Appointment(In office/virtual)/ []  [facility]/ [] Home Care/ [] Refused Recommended Disposition /[] [facility]/ [x]  Follow-up with PCP Additional Notes: Patient started on Metoprolol ER Succinate and was advised to call if SBP was lower than 110. Pt sts yesterday her BP was 100/85 and today it was 105/85. RN attempted to schedule pt for tomorrow at 0920, but pt unable to come in at that time, would prefer to speak with provider before scheduling. Called CAL for further triage and direction.   Reason for Disposition  [1] Systolic BP 90-110 AND [2] taking blood pressure medications AND [3] dizzy, lightheaded or weak  Answer Assessment - Initial Assessment Questions 1. BLOOD PRESSURE: "What is the blood pressure?" "Did you take at least two measurements 5 minutes apart?"     Yesterday BP was 100/85, today 105/85  2. ONSET: "When did you take your blood pressure?"     Took approx 10 before calling  3. HOW: "How did you obtain the blood pressure?" (e.g., visiting nurse, automatic home BP monitor)     Automatic cuff at home  4. HISTORY: "Do you have a history of low blood pressure?" "What is your blood pressure normally?"     Has history of HIGH bp, BP is normally 118-122/84 (while on meds) 135/95- BEFORE starting meds  5. MEDICINES: "Are you taking any medications for blood pressure?" If Yes, ask: "Have they been changed recently?"     Metoprolol ER, Clonidine, and Olmesartan  6. PULSE RATE: "Do you know what your pulse  rate is?"     Heart rate is 75   7. OTHER SYMPTOMS: "Have you been sick recently?" "Have you had a recent injury?"     Yesterday was light headed and tired, today she feels fine.   8. PREGNANCY: "Is there any chance you are pregnant?" "When was your last menstrual period?"     No, LMP-was last week  Protocols used: Blood Pressure - Low-A-AH

## 2023-04-15 NOTE — Telephone Encounter (Signed)
Office visit with at home blood pressure readings s he can stop taking clonidine medication and only take  Olmesartan-hydrochlorothiazide 40-12.5 mg and metoprolol succinate 50 mg - half a tablet

## 2023-04-16 NOTE — Telephone Encounter (Signed)
Pt understands and will check her schedule and make an appt to discuss and follow recomndations by [place]

## 2023-04-17 ENCOUNTER — Encounter: Payer: Self-pay | Admitting: Family Medicine

## 2023-04-20 NOTE — Telephone Encounter (Signed)
Please let her know I need to see her blood pressure readings to determine what's going on and if possible an office visit

## 2023-04-21 NOTE — Telephone Encounter (Signed)
These blood pressure readings are good and within desired limits

## 2023-04-24 ENCOUNTER — Telehealth: Payer: Self-pay

## 2023-04-26 ENCOUNTER — Other Ambulatory Visit: Payer: Self-pay | Admitting: Family Medicine

## 2023-04-26 DIAGNOSIS — G479 Sleep disorder, unspecified: Secondary | ICD-10-CM

## 2023-04-28 NOTE — Telephone Encounter (Signed)
Original order denied appeal sent to medicaid.

## 2023-05-02 ENCOUNTER — Other Ambulatory Visit: Payer: Self-pay | Admitting: Family Medicine

## 2023-05-19 ENCOUNTER — Encounter: Payer: Self-pay | Admitting: Family Medicine

## 2023-05-21 ENCOUNTER — Other Ambulatory Visit: Payer: Self-pay | Admitting: Family Medicine

## 2023-05-21 DIAGNOSIS — E782 Mixed hyperlipidemia: Secondary | ICD-10-CM

## 2023-05-21 MED ORDER — SEMAGLUTIDE-WEIGHT MANAGEMENT 0.25 MG/0.5ML ~~LOC~~ SOAJ
0.2500 mg | SUBCUTANEOUS | 0 refills | Status: AC
Start: 2023-05-21 — End: 2023-06-18

## 2023-05-21 NOTE — Telephone Encounter (Signed)
Please inform the patient : We discussed her weight loss during the office visit on 03/20/23. At that time, a follow-up appointment was required within 4 weeks to evaluate progress and determine the appropriateness of prescribing the medication. However, patient did not schedule or attend the follow-up visit, which is necessary for me to proceed with prescribing the medication. Kindly advise the patient to contact the office to schedule the follow-up appointment as soon as possible.

## 2023-06-01 ENCOUNTER — Other Ambulatory Visit: Payer: Self-pay | Admitting: Family Medicine

## 2023-06-01 DIAGNOSIS — G479 Sleep disorder, unspecified: Secondary | ICD-10-CM

## 2023-06-01 MED ORDER — HYDROXYZINE PAMOATE 25 MG PO CAPS
25.0000 mg | ORAL_CAPSULE | Freq: Every evening | ORAL | 0 refills | Status: DC
Start: 2023-06-01 — End: 2023-07-01

## 2023-06-02 ENCOUNTER — Encounter: Payer: Self-pay | Admitting: Family Medicine

## 2023-06-10 ENCOUNTER — Other Ambulatory Visit: Payer: Self-pay | Admitting: Family Medicine

## 2023-06-10 DIAGNOSIS — I10 Essential (primary) hypertension: Secondary | ICD-10-CM

## 2023-07-01 ENCOUNTER — Other Ambulatory Visit: Payer: Self-pay | Admitting: Family Medicine

## 2023-07-01 DIAGNOSIS — G479 Sleep disorder, unspecified: Secondary | ICD-10-CM

## 2023-07-02 MED ORDER — HYDROXYZINE PAMOATE 25 MG PO CAPS
25.0000 mg | ORAL_CAPSULE | Freq: Every evening | ORAL | 0 refills | Status: DC
Start: 2023-07-02 — End: 2023-07-27

## 2023-07-27 ENCOUNTER — Other Ambulatory Visit: Payer: Self-pay | Admitting: Family Medicine

## 2023-07-27 DIAGNOSIS — G479 Sleep disorder, unspecified: Secondary | ICD-10-CM

## 2023-08-05 ENCOUNTER — Encounter: Payer: Self-pay | Admitting: Family Medicine

## 2023-08-06 ENCOUNTER — Other Ambulatory Visit: Payer: Self-pay | Admitting: Family Medicine

## 2023-08-06 MED ORDER — FLUCONAZOLE 150 MG PO TABS: 150.0000 mg | ORAL_TABLET | Freq: Once | ORAL | 0 refills | Status: AC

## 2023-08-06 NOTE — Telephone Encounter (Signed)
 sent

## 2023-08-19 ENCOUNTER — Ambulatory Visit (INDEPENDENT_AMBULATORY_CARE_PROVIDER_SITE_OTHER): Payer: MEDICAID | Admitting: Family Medicine

## 2023-08-19 ENCOUNTER — Other Ambulatory Visit (HOSPITAL_COMMUNITY): Payer: Self-pay

## 2023-08-19 ENCOUNTER — Telehealth: Payer: Self-pay | Admitting: Pharmacy Technician

## 2023-08-19 ENCOUNTER — Encounter: Payer: Self-pay | Admitting: Family Medicine

## 2023-08-19 VITALS — BP 109/75 | HR 81 | Ht 66.0 in | Wt 249.1 lb

## 2023-08-19 DIAGNOSIS — G479 Sleep disorder, unspecified: Secondary | ICD-10-CM | POA: Diagnosis not present

## 2023-08-19 DIAGNOSIS — I10 Essential (primary) hypertension: Secondary | ICD-10-CM

## 2023-08-19 DIAGNOSIS — R7301 Impaired fasting glucose: Secondary | ICD-10-CM

## 2023-08-19 DIAGNOSIS — M2011 Hallux valgus (acquired), right foot: Secondary | ICD-10-CM

## 2023-08-19 DIAGNOSIS — E559 Vitamin D deficiency, unspecified: Secondary | ICD-10-CM | POA: Diagnosis not present

## 2023-08-19 DIAGNOSIS — M79671 Pain in right foot: Secondary | ICD-10-CM

## 2023-08-19 DIAGNOSIS — E66813 Obesity, class 3: Secondary | ICD-10-CM

## 2023-08-19 DIAGNOSIS — Z6841 Body Mass Index (BMI) 40.0 and over, adult: Secondary | ICD-10-CM

## 2023-08-19 MED ORDER — SEMAGLUTIDE-WEIGHT MANAGEMENT 0.25 MG/0.5ML ~~LOC~~ SOAJ: 0.2500 mg | SUBCUTANEOUS | 0 refills | Status: AC

## 2023-08-19 MED ORDER — TRAZODONE HCL 50 MG PO TABS: 50.0000 mg | ORAL_TABLET | Freq: Every evening | ORAL | 3 refills | Status: DC | PRN

## 2023-08-19 NOTE — Assessment & Plan Note (Signed)
 Advise to wear wide, supportive shoes with a soft toe box to reduce pressure on the big toe. Use padding or bunion splints to relieve pain and prevent further irritation. Ice and over-the-counter anti-inflammatory medications can help manage discomfort and swelling. Patient requesting referral to podiatry for steroid injections

## 2023-08-19 NOTE — Assessment & Plan Note (Signed)
 Trial on Wegovy 0.25 mg Labs ordered  comorbidities: hypertension, hyperlipidemia  Discussed Eat a Balanced Diet: Focus on whole, nutrient-dense foods like lean proteins, vegetables, fruits, whole grains, and healthy fats while avoiding processed and sugary foods. Stay Active: Incorporate at least 30 minutes of moderate physical activity most days of the week, such as walking, jogging, or strength training. Hydrate and Rest: Drink plenty of water throughout the day and ensure you get 7-9 hours of quality sleep each night to support metabolism and recovery. Practice Portion Control: Use smaller plates, measure portions, and eat mindfully to avoid overeating and manage calorie intake effectively.

## 2023-08-19 NOTE — Assessment & Plan Note (Signed)
 Trial on Trazodone 50 mg at bedtime Explained to go to bed at the same time each night and get up at the same time each morning, including on the weekends. Make sure your bedroom is quiet, dark, relaxing, and at a comfortable temperature. Remove electronic devices, such as TVs, computers, and smart phones, from the bedroom.

## 2023-08-19 NOTE — Progress Notes (Addendum)
 Established Patient Office Visit   Subjective  Patient ID: Veronica Calhoun DOBIS, female    DOB: April 17, 1986  Age: 38 y.o. MRN: [id number]  Chief Complaint  Patient presents with   Medical Management of Chronic Issues    5 month  chronic symptom f/u  Would like to discuss wt. Loss options  Would like to increase dosage of hydroxyzine     She  has a past medical history of Acute pancreatitis (12/29/2021), Alcohol abuse (06/24/2022), Alcohol induced acute pancreatitis (06/10/2022), Alcohol use disorder, severe, dependence (HCC) (05/20/2018), Alcoholic hepatitis without ascites (06/19/2022), Anasarca (06/20/2022), Anxiety (2013), Generalized anxiety disorder (04/01/2013), History of hypertension (11/15/2021), Hypertension (2014), Pancreatitis, and Warts, genital (07/21/2019).  Obesity: Patient complains of obesity. Patient cites health, increased physical ability, self-image as reasons for wanting to lose weight.  Obesity History Weight in late teens: 180 lb. Period of greatest weight gain: 30 lb during early adult years Lowest adult weight: 212 lb Highest adult weight: 249 lb Amount of time at present weight: 249  lb.   History of Weight Loss Efforts  Circumstances associated with regain of weight: inconsistent diet and excersie routine  Successful weight loss techniques attempted:  self-directed dieting Unsuccessful weight loss techniques attempted: very low calorie diet Current Exercise Habits walking daily 15-30 minutes per day  Current Eating Habits Number of regular meals per day: 2 Number of snacking episodes per day: 3 Who shops for food? patient Who prepares food? patient Who eats with patient? patient and daughter Binge behavior?: no Purge behavior? no Anorexic behavior? no Eating precipitated by stress? no Guilt feelings associated with eating? no  Other Potential Contributing Factors Use of alcohol: average 0 drinks/week Use of medications that may cause weight  gain none History of past abuse? none Psych History: anxiety, mood swings, and obesity Comorbidities: dyslipidemias and hypertension      Right foot pain Patient presents with pain localized to the right big toe, representing Hallux Valgus. There was no known injury or trauma leading to the onset. The pain is described as fluctuating and currently rated at 8/10 in severity. Pertinent negatives include no difficulty bearing weight, and no associated numbness, tingling, or presence of foreign bodies. The patient has attempted wearing loose-fitting shoes, which provided only mild relief.         Review of Systems  Constitutional:  Negative for chills and fever.  Eyes:  Negative for blurred vision and double vision.  Respiratory:  Negative for shortness of breath.   Cardiovascular:  Negative for chest pain.  Neurological:  Negative for dizziness.      Objective:     BP 109/75   Pulse 81   Ht 5\' 6"  (1.676 m)   Wt 249 lb 1.3 oz (113 kg)   LMP 08/11/2023 (Approximate)   SpO2 98%   BMI 40.20 kg/m  BP Readings from Last 3 Encounters:  08/19/23 109/75  03/20/23 132/86  11/27/22 136/86      Physical Exam Vitals reviewed.  Constitutional:      General: She is not in acute distress.    Appearance: Normal appearance. She is not ill-appearing, toxic-appearing or diaphoretic.  HENT:     Head: Normocephalic.  Eyes:     General:        Right eye: No discharge.        Left eye: No discharge.     Conjunctiva/sclera: Conjunctivae normal.  Cardiovascular:     Rate and Rhythm: Normal rate.     Pulses: Normal pulses.  Heart sounds: Normal heart sounds.  Pulmonary:     Effort: Pulmonary effort is normal. No respiratory distress.     Breath sounds: Normal breath sounds.  Abdominal:     General: Bowel sounds are normal.     Palpations: Abdomen is soft.     Tenderness: There is no abdominal tenderness. There is no guarding.  Musculoskeletal:        General: Deformity  present. Normal range of motion.     Cervical back: Normal range of motion.     Comments:  Right Hallux Valgus  Skin:    General: Skin is warm and dry.     Capillary Refill: Capillary refill takes less than 2 seconds.  Neurological:     Mental Status: She is alert.     Coordination: Coordination normal.     Gait: Gait normal.  Psychiatric:        Mood and Affect: Mood normal.        Behavior: Behavior normal.      No results found for any visits on 08/19/23.  The ASCVD Risk score (Arnett DK, et al., 2019) failed to calculate for the following reasons:   The 2019 ASCVD risk score is only valid for ages 66 to 52    Assessment & Plan:  Primary hypertension -     BMP8+eGFR -     CBC with Differential/Platelet -     Lipid panel  IFG (impaired fasting glucose) -     Hemoglobin A1c  Vitamin D deficiency -     VITAMIN D 25 Hydroxy (Vit-D Deficiency, Fractures)  Sleeping difficulty -     traZODone HCl; Take 1 tablet (50 mg total) by mouth at bedtime as needed for sleep.  Dispense: 30 tablet; Refill: 3  Class 3 severe obesity due to excess calories with serious comorbidity and body mass index (BMI) of 40.0 to 44.9 in adult [facility]) Assessment & Plan: Trial on Wegovy 0.25 mg Labs ordered  comorbidities: hypertension, hyperlipidemia  Discussed Eat a Balanced Diet: Focus on whole, nutrient-dense foods like lean proteins, vegetables, fruits, whole grains, and healthy fats while avoiding processed and sugary foods. Stay Active: Incorporate at least 30 minutes of moderate physical activity most days of the week, such as walking, jogging, or strength training. Hydrate and Rest: Drink plenty of water throughout the day and ensure you get 7-9 hours of quality sleep each night to support metabolism and recovery. Practice Portion Control: Use smaller plates, measure portions, and eat mindfully to avoid overeating and manage calorie intake effectively.   Orders: -     Semaglutide-Weight  Management; Inject 0.25 mg into the skin once a week for 28 days.  Dispense: 2 mL; Refill: 0  Right foot pain -     Ambulatory referral to Podiatry  Hallux valgus of right foot Assessment & Plan: Advise to wear wide, supportive shoes with a soft toe box to reduce pressure on the big toe. Use padding or bunion splints to relieve pain and prevent further irritation. Ice and over-the-counter anti-inflammatory medications can help manage discomfort and swelling. Patient requesting referral to podiatry for steroid injections   Difficulty sleeping Assessment & Plan: Trial on Trazodone 50 mg at bedtime  Explained to go to bed at the same time each night and get up at the same time each morning, including on the weekends. Make sure your bedroom is quiet, dark, relaxing, and at a comfortable temperature. Remove electronic devices, such as TVs, computers, and smart phones, from the  bedroom.      Return in 4 months (on 12/19/2023), or if symptoms worsen or fail to improve, for Weight Loss Mangment, chronic follow-up.   Avelino Lek Amber Bail, FNP

## 2023-08-19 NOTE — Patient Instructions (Addendum)
        Great to see you today.  I have refilled the medication(s) we provide.    Call insurance Current approved weight loss not for type 2 diabetes  injections include:  Wegovy   Zepbound (tirzepatide)  Saxenda (liraglutide)    If labs were collected, we will inform you of lab results once received either by echart message or telephone call.   - echart message- for normal results that have been seen by the patient already.   - telephone call: abnormal results or if patient has not viewed results in their echart.   - Please take medications as prescribed. - Follow up with your primary health provider if any health concerns arises. - If symptoms worsen please contact your primary care provider and/or visit the emergency department.      Wegovy (Semaglutide) Titration Schedule Month 1: 0.25 mg once a week. Month 2: 0.5 mg once a week. Month 3: 1 mg once a week. Month 4: 1.7 mg once a week. Month 5 and Beyond: 2.4 mg once a week (maintenance dose).  Common Side Effects of Wegovy Gastrointestinal (most common):Nausea, Vomiting, Diarrhea. Constipation. Stomach pain  OTC Medications for Wegovy Side Effects Nausea: Ginger supplements or Dramamine (dimenhydrinate). Diarrhea: Imodium (loperamide). Constipation: Colace (docusate) or fiber supplements like psyllium. Stomach Pain/Indigestion: Tums or Pepcid (famotidine). Headache: Tylenol (acetaminophen) or Advil (ibuprofen).

## 2023-08-19 NOTE — Telephone Encounter (Signed)
 Pharmacy Patient Advocate Encounter   Received notification from CoverMyMeds that prior authorization for Wegovy 0.25MG /0.5ML auto-injectors is required/requested.   Insurance verification completed.   The patient is insured through [facility] .   Per test claim: PA required; PA submitted to above mentioned insurance via CoverMyMeds Key/confirmation #/EOC [id number] Status is pending

## 2023-08-19 NOTE — Telephone Encounter (Signed)
 Pharmacy Patient Advocate Encounter  Received notification from [facility] that Prior Authorization for [facility] 0.25MG /0.5ML auto-injectors has been APPROVED from 08/19/2023 to 02/18/2024. Ran test claim, Copay is $4.00. This test claim was processed through [facility]- copay amounts may vary at other pharmacies due to pharmacy/plan contracts, or as the patient moves through the different stages of their insurance plan.   PA #/Case ID/Reference #: Key: [id number]

## 2023-08-20 ENCOUNTER — Encounter: Payer: Self-pay | Admitting: Family Medicine

## 2023-08-20 LAB — BMP8+EGFR
BUN/Creatinine Ratio: 16 (ref 9–23)
BUN: 14 mg/dL (ref 6–20)
CO2: 21 mmol/L (ref 20–29)
Calcium: 9.3 mg/dL (ref 8.7–10.2)
Chloride: 101 mmol/L (ref 96–106)
Creatinine, Ser: 0.86 mg/dL (ref 0.57–1.00)
Glucose: 93 mg/dL (ref 70–99)
Potassium: 4.3 mmol/L (ref 3.5–5.2)
Sodium: 137 mmol/L (ref 134–144)
eGFR: 90 mL/min/{1.73_m2} (ref >59)

## 2023-08-20 LAB — CBC WITH DIFFERENTIAL/PLATELET
Basophils Absolute: 0.1 10*3/uL (ref 0.0–0.2)
Basos: 1 %
EOS (ABSOLUTE): 0.5 10*3/uL — ABNORMAL HIGH (ref 0.0–0.4)
Eos: 6 %
Hematocrit: 36.8 % (ref 34.0–46.6)
Hemoglobin: 12.2 g/dL (ref 11.1–15.9)
Immature Grans (Abs): 0 10*3/uL (ref 0.0–0.1)
Immature Granulocytes: 0 %
Lymphocytes Absolute: 2.6 10*3/uL (ref 0.7–3.1)
Lymphs: 32 %
MCH: 29.5 pg (ref 26.6–33.0)
MCHC: 33.2 g/dL (ref 31.5–35.7)
MCV: 89 fL (ref 79–97)
Monocytes Absolute: 0.7 10*3/uL (ref 0.1–0.9)
Monocytes: 9 %
Neutrophils Absolute: 4.2 10*3/uL (ref 1.4–7.0)
Neutrophils: 52 %
Platelets: 306 10*3/uL (ref 150–450)
RBC: 4.14 x10E6/uL (ref 3.77–5.28)
RDW: 13.2 % (ref 11.7–15.4)
WBC: 8.2 10*3/uL (ref 3.4–10.8)

## 2023-08-20 LAB — LIPID PANEL
Chol/HDL Ratio: 3.8 ratio (ref 0.0–4.4)
Cholesterol, Total: 134 mg/dL (ref 100–199)
HDL: 35 mg/dL — ABNORMAL LOW (ref >39)
LDL Chol Calc (NIH): 80 mg/dL (ref 0–99)
Triglycerides: 103 mg/dL (ref 0–149)
VLDL Cholesterol Cal: 19 mg/dL (ref 5–40)

## 2023-08-20 LAB — HEMOGLOBIN A1C
Est. average glucose Bld gHb Est-mCnc: 108 mg/dL
Hgb A1c MFr Bld: 5.4 % (ref 4.8–5.6)

## 2023-08-20 LAB — VITAMIN D 25 HYDROXY (VIT D DEFICIENCY, FRACTURES): Vit D, 25-Hydroxy: 17.7 ng/mL — ABNORMAL LOW (ref 30.0–100.0)

## 2023-08-21 ENCOUNTER — Telehealth: Payer: Self-pay | Admitting: Family Medicine

## 2023-08-21 NOTE — Telephone Encounter (Signed)
 Copied from CRM [id number]. Topic: Clinical - Medication Question >> Aug 21, 2023  9:41 AM Jorie Newness J wrote: Reason for CRM: Pt states she needs help administering her wegovy  shot. Please advise Pt callback # [phone]  This nurse was able to answer questions regarding administration of sq injection.

## 2023-09-18 ENCOUNTER — Other Ambulatory Visit: Payer: Self-pay | Admitting: Family Medicine

## 2023-09-18 DIAGNOSIS — E66813 Obesity, class 3: Secondary | ICD-10-CM

## 2023-09-18 DIAGNOSIS — Z6841 Body Mass Index (BMI) 40.0 and over, adult: Secondary | ICD-10-CM

## 2023-09-18 MED ORDER — SEMAGLUTIDE-WEIGHT MANAGEMENT 0.5 MG/0.5ML ~~LOC~~ SOAJ: 0.5000 mg | SUBCUTANEOUS | 0 refills | Status: AC

## 2023-10-15 ENCOUNTER — Other Ambulatory Visit: Payer: Self-pay | Admitting: Family Medicine

## 2023-10-15 ENCOUNTER — Encounter: Payer: Self-pay | Admitting: Family Medicine

## 2023-10-15 DIAGNOSIS — E66813 Obesity, class 3: Secondary | ICD-10-CM

## 2023-10-15 DIAGNOSIS — A64 Unspecified sexually transmitted disease: Secondary | ICD-10-CM

## 2023-10-15 MED ORDER — SEMAGLUTIDE-WEIGHT MANAGEMENT 1 MG/0.5ML ~~LOC~~ SOAJ: 1.0000 mg | SUBCUTANEOUS | 0 refills | Status: AC

## 2023-10-15 NOTE — Telephone Encounter (Signed)
 Nu swab ordered - patient can walk in for this

## 2023-10-19 LAB — NUSWAB VAGINITIS PLUS (VG+)
Atopobium vaginae: HIGH {score} — AB
BVAB 2: HIGH {score} — AB
Candida albicans, NAA: NEGATIVE
Candida glabrata, NAA: NEGATIVE
Megasphaera 1: HIGH {score} — AB

## 2023-10-20 ENCOUNTER — Telehealth: Payer: Self-pay

## 2023-10-20 NOTE — Telephone Encounter (Signed)
 Copied from CRM [id number]. Topic: Clinical - Lab/Test Results >> Oct 20, 2023 10:29 AM El Gravely T wrote: Reason for CRM: Patient calling to discuss most recent lab results.  Please call patient at [phone] to discuss further.

## 2023-10-21 ENCOUNTER — Encounter: Payer: Self-pay | Admitting: Family Medicine

## 2023-10-27 ENCOUNTER — Ambulatory Visit: Payer: Self-pay | Admitting: Family Medicine

## 2023-10-27 ENCOUNTER — Other Ambulatory Visit: Payer: Self-pay | Admitting: Family Medicine

## 2023-10-27 ENCOUNTER — Telehealth: Payer: Self-pay

## 2023-10-27 DIAGNOSIS — Z114 Encounter for screening for human immunodeficiency virus [HIV]: Secondary | ICD-10-CM

## 2023-10-27 MED ORDER — METRONIDAZOLE 0.75 % VA GEL: 1.0000 | Freq: Every day | VAGINAL | 0 refills | Status: AC

## 2023-10-27 NOTE — Telephone Encounter (Signed)
 Order is in for hiv testing- patient can walk in for labs

## 2023-10-27 NOTE — Telephone Encounter (Signed)
 Results has not been resulted let pt know that It wasn't resulted yet on previous encounter, was sent to [place] to read since PCP is out of office

## 2023-10-27 NOTE — Telephone Encounter (Signed)
 Copied from CRM [id number]. Topic: Clinical - Lab/Test Results >> Oct 27, 2023  9:36 AM Selinda RAMAN wrote: Reason for CRM: The patient called in stating she had a vaginal swab on June 13th and sees that it is abnormal but no one has called her about it. She has questions and needs to speak with someone as soon as possible. Please assist patient further.

## 2023-10-28 ENCOUNTER — Other Ambulatory Visit: Payer: Self-pay | Admitting: Family Medicine

## 2023-10-30 LAB — HIV ANTIBODY (ROUTINE TESTING W REFLEX): HIV Screen 4th Generation wRfx: NONREACTIVE

## 2023-11-16 ENCOUNTER — Other Ambulatory Visit: Payer: Self-pay | Admitting: Family Medicine

## 2023-11-18 ENCOUNTER — Other Ambulatory Visit: Payer: Self-pay | Admitting: Family Medicine

## 2023-11-18 ENCOUNTER — Encounter: Payer: Self-pay | Admitting: Family Medicine

## 2023-11-18 MED ORDER — SEMAGLUTIDE-WEIGHT MANAGEMENT 1.7 MG/0.75ML ~~LOC~~ SOAJ: 1.7000 mg | SUBCUTANEOUS | 0 refills | Status: DC

## 2023-11-18 NOTE — Telephone Encounter (Signed)
I sent this already.

## 2023-12-01 ENCOUNTER — Ambulatory Visit (HOSPITAL_COMMUNITY)
Admission: RE | Admit: 2023-12-01 | Discharge: 2023-12-01 | Disposition: A | Discharge status: Home / Self Care | Payer: MEDICAID | Source: Ambulatory Visit | Attending: Nurse Practitioner | Admitting: Nurse Practitioner

## 2023-12-01 ENCOUNTER — Telehealth: Payer: Self-pay | Admitting: Nurse Practitioner

## 2023-12-01 ENCOUNTER — Ambulatory Visit
Admission: EM | Admit: 2023-12-01 | Discharge: 2023-12-01 | Disposition: A | Discharge status: Home / Self Care | Payer: MEDICAID | Attending: Nurse Practitioner | Admitting: Nurse Practitioner

## 2023-12-01 DIAGNOSIS — M25571 Pain in right ankle and joints of right foot: Secondary | ICD-10-CM

## 2023-12-01 DIAGNOSIS — S99911A Unspecified injury of right ankle, initial encounter: Secondary | ICD-10-CM | POA: Diagnosis not present

## 2023-12-01 DIAGNOSIS — S8264XA Nondisplaced fracture of lateral malleolus of right fibula, initial encounter for closed fracture: Secondary | ICD-10-CM | POA: Diagnosis not present

## 2023-12-01 DIAGNOSIS — W19XXXA Unspecified fall, initial encounter: Secondary | ICD-10-CM | POA: Diagnosis not present

## 2023-12-01 DIAGNOSIS — M25579 Pain in unspecified ankle and joints of unspecified foot: Secondary | ICD-10-CM | POA: Insufficient documentation

## 2023-12-01 MED ORDER — TRAMADOL HCL 50 MG PO TABS: 50.0000 mg | ORAL_TABLET | Freq: Two times a day (BID) | ORAL | 0 refills | Status: DC | PRN

## 2023-12-01 NOTE — Discharge Instructions (Signed)
 You will need to go to [facility] for an x-ray of the right ankle.  Please go to the main entrance of the hospital to get to the radiology department.  You will be contacted when the results of the x-ray are received.  You will also access to the results via MyChart. A cam boot has been provided to allow for additional compression and support.  This will also assist you with walking.  If the x-ray of the ankle is positive for fracture, continue use of the cam boot until you have been seen by orthopedics.  If the x-ray is negative, slowly wean yourself from use of the cam boot over the next several days. RICE therapy, rest, ice, compression, and elevation.  Apply ice for 20 minutes, remove for 1 hour, repeat as needed.  Recommend doing this for the next 24 to 48 hours to help reduce swelling and pain. You may take over-the-counter Tylenol  or ibuprofen  as needed for pain or discomfort. If the x-ray does show a fracture, you will need to follow-up with orthopedics within the next 24 to 48 hours for reevaluation.  If the x-ray is negative, and symptoms have not improved over the next 2 to 3 weeks, recommend following up with orthopedics for further evaluation. Follow-up as needed.

## 2023-12-01 NOTE — Telephone Encounter (Signed)
 Received the patient's x-ray results of the right ankle.  Called patient's primary number, unable to leave message.  Called patient's contact, her Newell Crimes, patient's mother, as patient's mother to have patient returned my phone call.   Patient returned phone call.  Spoke with patient, verified patient with 2 patient identifiers.  Patient was advised that she does have a nondisplaced fracture of her distal fibula.  Cam boot was provided while patient was in the clinic.  Patient advised to limit weightbearing until she has been seen by orthopedics.  Will send over prescription for tramadol  50 mg for pain.  Patient advised to continue RICE therapy.  Patient was given information for [facility] and Ortho care [place].  Patient verbalized understanding.  All questions were answered.  Unable to send prescription for tramadol  via e-prescribing.  Call patient to let her know that she can pick up the prescription at the front desk.  This is Bari again from urgent care I will send a prescription over for the

## 2023-12-01 NOTE — ED Provider Notes (Signed)
 [facility]    CSN: [id number] Arrival date & time: 12/01/23  1458      History   Chief Complaint No chief complaint on file.   HPI Veronica Calhoun is a 38 y.o. female.   The history is provided by the patient.   Patient presents for complaints of right ankle pain.  Patient states that she fell earlier today on a hill and twisted the right ankle.  Since that time, she has had pain, swelling, and difficulty weightbearing.  She denies numbness, tingling, or radiation of pain.  Patient states she has not tried any medication or applied ice for her symptoms.  She denies prior injury or trauma.  Past Medical History:  Diagnosis Date   Acute pancreatitis 12/29/2021   Alcohol abuse 06/24/2022   Alcohol induced acute pancreatitis 06/10/2022   Alcohol use disorder, severe, dependence (HCC) 05/20/2018   Alcoholic hepatitis without ascites 06/19/2022   Anasarca 06/20/2022   Anxiety 2013   Generalized anxiety disorder 04/01/2013   History of hypertension 11/15/2021   Hypertension 2014   Pancreatitis    Warts, genital 07/21/2019    Patient Active Problem List   Diagnosis Date Noted   Hallux valgus of right foot 08/19/2023   Class 3 severe obesity due to excess calories with body mass index (BMI) of 40.0 to 44.9 in adult 03/20/2023   Tooth pain 03/20/2023   Left leg pain 11/27/2022   Cervical cancer screening 11/27/2022   Difficulty sleeping 11/27/2022   Hyperlipidemia 11/27/2022   Acute alcoholic pancreatitis 08/30/2022   GERD without esophagitis 06/24/2022   Alcohol abuse 06/24/2022   Elevated LFTs 06/24/2022   Hypoalbuminemia 06/24/2022   Sepsis due to pneumonia (HCC) 06/23/2022   Anasarca 06/20/2022   Alcoholic hepatitis without ascites 06/19/2022   Alcoholic hepatitis with ascites 06/16/2022   Abdominal pain, epigastric 06/16/2022   Ileus (HCC) 06/16/2022   Tobacco use disorder 06/11/2022   Tachycardia 06/11/2022   Hypokalemia 06/11/2022   Hypertensive  crisis 06/10/2022   Transaminitis 06/10/2022   Obesity, Class III, BMI 40-49.9 (morbid obesity) 12/29/2021   Leukocytosis 12/28/2021   Abnormal cytology smear of cervix 11/15/2021   History of hypertension 11/15/2021   ASCUS with positive high risk HPV cervical 07/21/2019   Warts, genital 07/21/2019   Depression 04/15/2019   Alcohol use disorder, severe, dependence (HCC) 05/20/2018   Panic attacks 04/01/2013   Essential hypertension 05/05/2012   Anxiety 05/06/2011    Past Surgical History:  Procedure Laterality Date   CESAREAN SECTION  03/18/2007    OB History   No obstetric history on file.      Home Medications    Prior to Admission medications   Medication Sig Start Date End Date Taking? Authorizing Provider  albuterol  (VENTOLIN  HFA) 108 (90 Base) MCG/ACT inhaler Inhale 2 puffs into the lungs every 6 (six) hours as needed for wheezing or shortness of breath. Patient not taking: Reported on 08/19/2023 06/23/22   Maree Bracken D, DO  Aspirin-Salicylamide-Caffeine (BC HEADACHE) 325-95-16 MG TABS Take 1 Package by mouth daily as needed (for pain).    [provider]  Blood Pressure Monitoring (BLOOD PRESSURE KIT) DEVI Take blood pressure reading 2 times daily. 12/23/22   Del Orbe Polanco, Hilario, FNP  cloNIDine  (CATAPRES ) 0.2 MG tablet TAKE 1 TABLET(0.2 MG) BY MOUTH THREE TIMES DAILY 10/28/23   Del Wilhelmena Falter, Iliana, FNP  gabapentin  (NEURONTIN ) 300 MG capsule Take 1 capsule (300 mg total) by mouth 3 (three) times daily. 03/20/23  Del Wilhelmena Falter, [place], FNP  hydrOXYzine  (VISTARIL ) 25 MG capsule TAKE 1 CAPSULE(25 MG) BY MOUTH AT BEDTIME 07/27/23   Del Orbe Polanco, Iliana, FNP  metoprolol  succinate (TOPROL -XL) 50 MG 24 hr tablet Take 1 tablet (50 mg total) by mouth daily. Take with or immediately following a meal. 03/20/23   Del Wilhelmena Falter, Hilario, FNP  olmesartan -hydrochlorothiazide  (BENICAR  HCT) 40-12.5 MG tablet TAKE 1 TABLET BY MOUTH DAILY 06/10/23   Del Orbe Polanco,  Iliana, FNP  rosuvastatin  (CRESTOR ) 5 MG tablet TAKE 1 TABLET(5 MG) BY MOUTH DAILY 05/21/23   Del Wilhelmena Falter, Hilario, FNP  Semaglutide -Weight Management 1 MG/0.5ML SOAJ Inject 1 mg into the skin once a week for 28 days. 12/12/23 01/09/24  Del Wilhelmena Falter Hilario, FNP  Semaglutide -Weight Management 1.7 MG/0.75ML SOAJ Inject 1.7 mg into the skin once a week for 28 days. 02/13/24 03/12/24  Del Orbe Polanco, Iliana, FNP  traZODone  (DESYREL ) 50 MG tablet Take 1 tablet (50 mg total) by mouth at bedtime as needed for sleep. 08/19/23   Del Wilhelmena Falter Hilario, FNP    Family History Family History  Problem Relation Age of Onset   Heart murmur Father    AAA (abdominal aortic aneurysm) Sister    Urinary tract infection Daughter    Hypertension Maternal Grandmother     Social History Social History   Tobacco Use   Smoking status: Every Day    Current packs/day: 0.50    Average packs/day: 0.5 packs/day for 10.0 years (5.0 ttl pk-yrs)    Types: Cigarettes   Smokeless tobacco: Never  Vaping Use   Vaping status: Never Used  Substance Use Topics   Alcohol use: Not Currently    Alcohol/week: 10.0 standard drinks of alcohol    Types: 10 Cans of beer per week   Drug use: No     Allergies   Lisinopril and Amlodipine   Review of Systems Review of Systems Per HPI  Physical Exam Triage Vital Signs ED Triage Vitals  Encounter Vitals Group     BP 12/01/23 1508 103/74     Girls Systolic BP Percentile --      Girls Diastolic BP Percentile --      Boys Systolic BP Percentile --      Boys Diastolic BP Percentile --      Pulse Rate 12/01/23 1508 (!) 106     Resp 12/01/23 1508 20     Temp 12/01/23 1508 98.7 F (37.1 C)     Temp Source 12/01/23 1508 Oral     SpO2 12/01/23 1508 98 %     Weight --      Height --      Head Circumference --      Peak Flow --      Pain Score 12/01/23 1512 7     Pain Loc --      Pain Education --      Exclude from Growth Chart --    No data found.  Updated  Vital Signs BP 103/74 (BP Location: Right Arm)   Pulse (!) 106   Temp 98.7 F (37.1 C) (Oral)   Resp 20   LMP 11/03/2023 (Within Days)   SpO2 98%   Visual Acuity Right Eye Distance:   Left Eye Distance:   Bilateral Distance:    Right Eye Near:   Left Eye Near:    Bilateral Near:     Physical Exam Vitals and nursing note reviewed.  Constitutional:      General: She  is not in acute distress.    Appearance: Normal appearance.  HENT:     Head: Normocephalic.  Eyes:     Extraocular Movements: Extraocular movements intact.     Pupils: Pupils are equal, round, and reactive to light.  Pulmonary:     Effort: Pulmonary effort is normal.  Musculoskeletal:     Cervical back: Normal range of motion.     Right ankle: Swelling present. No ecchymosis. Tenderness present over the lateral malleolus. Decreased range of motion. Anterior drawer test negative. Normal pulse.     Comments: Swelling noted to the lateral malleolus of the right ankle.  Decreased range of motion is present.  +2 DP/PT pulses, neurovascularly intact.  Skin:    General: Skin is warm and dry.  Neurological:     General: No focal deficit present.     Mental Status: She is alert and oriented to person, place, and time.  Psychiatric:        Mood and Affect: Mood normal.        Behavior: Behavior normal.      UC Treatments / Results  Labs (all labs ordered are listed, but only abnormal results are displayed) Labs Reviewed - No data to display  EKG   Radiology No results found.  Procedures Procedures (including critical care time)  Medications Ordered in UC Medications - No data to display  Initial Impression / Assessment and Plan / UC Course  I have reviewed the triage vital signs and the nursing notes.  Pertinent labs & imaging results that were available during my care of the patient were reviewed by me and considered in my medical decision making (see chart for details).  X-ray of the right ankle  is pending.  If x-ray is negative, suspect a sprain of the right ankle.  Cam boot provided to allow for compression and support.  Supportive care recommendations were provided and discussed with the patient to include RICE therapy, and over-the-counter analgesics.  Patient given information for orthopedics in the event that follow-up is indicated.  Patient was in agreement with this plan of care and verbalizes understanding.  All questions were answered.  Patient stable for discharge.   Final Clinical Impressions(s) / UC Diagnoses   Final diagnoses:  None   Discharge Instructions   None    ED Prescriptions   None    PDMP not reviewed this encounter.   Gilmer Etta PARAS, NP 12/01/23 1529

## 2023-12-01 NOTE — ED Triage Notes (Addendum)
 Pt reports right ankle pain, some swelling present fell on a hill and felt a muscle pop. Pt states her leg went behind her when she fell. Popping since it occurred this morning at 10:00 am.

## 2023-12-02 ENCOUNTER — Ambulatory Visit (HOSPITAL_COMMUNITY): Payer: Self-pay

## 2023-12-02 ENCOUNTER — Other Ambulatory Visit: Payer: Self-pay | Admitting: Otolaryngology

## 2023-12-02 ENCOUNTER — Other Ambulatory Visit (HOSPITAL_COMMUNITY): Payer: Self-pay | Admitting: Otolaryngology

## 2023-12-02 DIAGNOSIS — M25571 Pain in right ankle and joints of right foot: Secondary | ICD-10-CM

## 2023-12-03 ENCOUNTER — Ambulatory Visit
Admission: RE | Admit: 2023-12-03 | Discharge: 2023-12-03 | Disposition: A | Discharge status: Home / Self Care | Payer: MEDICAID | Source: Ambulatory Visit | Attending: Otolaryngology | Admitting: Otolaryngology

## 2023-12-03 ENCOUNTER — Other Ambulatory Visit: Payer: Self-pay | Admitting: Family Medicine

## 2023-12-03 DIAGNOSIS — E782 Mixed hyperlipidemia: Secondary | ICD-10-CM

## 2023-12-03 DIAGNOSIS — S8263XA Displaced fracture of lateral malleolus of unspecified fibula, initial encounter for closed fracture: Secondary | ICD-10-CM | POA: Insufficient documentation

## 2023-12-03 DIAGNOSIS — M25571 Pain in right ankle and joints of right foot: Secondary | ICD-10-CM

## 2023-12-04 ENCOUNTER — Encounter (HOSPITAL_BASED_OUTPATIENT_CLINIC_OR_DEPARTMENT_OTHER): Payer: Self-pay | Admitting: Orthopaedic Surgery

## 2023-12-04 ENCOUNTER — Other Ambulatory Visit: Payer: Self-pay

## 2023-12-04 NOTE — Discharge Instructions (Addendum)
 Lillia Mountain, MD [facility]  Please read the following information regarding your care after surgery.  Medications  You only need a prescription for the narcotic pain medicine (ex. oxycodone , Percocet, Norco).  All of the other medicines listed below are available over the counter. ? Aleve 2 pills twice a day for the first 3 days after surgery. ? acetominophen (Tylenol ) 650 mg every 4-6 hours as you need for minor to moderate pain ? oxycodone  as prescribed for severe pain  ? To help prevent blood clots, take aspirin (81 mg) twice daily for at least 42 days after surgery.  You should also get up every hour while you are awake to move around.  Weight Bearing ? Do NOT bear any weight on the operated leg or foot. This means do NOT touch your surgical leg to the ground!  Cast / Splint / Dressing ? If you have a splint, do NOT remove this. Keep your splint, cast or dressing clean and dry.  Don't put anything (coat hanger, pencil, etc) down inside of it.  If it gets wet, call the office immediately to schedule an appointment for a cast change.  Swelling IMPORTANT: It is normal for you to have swelling where you had surgery. To reduce swelling and pain, keep at least 3 pillows under your leg so that your toes are above your nose and your heel is above the level of your hip.  It may be necessary to keep your foot or leg elevated for several weeks.  This is critical to helping your incisions heal and your pain to feel better.  Follow Up Call my office at [phone] when you are discharged from the hospital or surgery center to schedule an appointment to be seen 7-10 days after surgery.  Call my office at [phone] if you develop a fever >101.5 F, nausea, vomiting, bleeding from the surgical site or severe pain.     Post Anesthesia Home Care Instructions  Activity: Get plenty of rest for the remainder of the day. A responsible individual must stay with you for 24 hours following the  procedure.  For the next 24 hours, DO NOT: -Drive a car -Advertising copywriter -Drink alcoholic beverages -Take any medication unless instructed by your physician -Make any legal decisions or sign important papers.  Meals: Start with liquid foods such as gelatin or soup. Progress to regular foods as tolerated. Avoid greasy, spicy, heavy foods. If nausea and/or vomiting occur, drink only clear liquids until the nausea and/or vomiting subsides. Call your physician if vomiting continues.  Special Instructions/Symptoms: Your throat may feel dry or sore from the anesthesia or the breathing tube placed in your throat during surgery. If this causes discomfort, gargle with warm salt water . The discomfort should disappear within 24 hours.  If you had a scopolamine  patch placed behind your ear for the management of post- operative nausea and/or vomiting:  1. The medication in the patch is effective for 72 hours, after which it should be removed.  Wrap patch in a tissue and discard in the trash. Wash hands thoroughly with soap and water . 2. You may remove the patch earlier than 72 hours if you experience unpleasant side effects which may include dry mouth, dizziness or visual disturbances. 3. Avoid touching the patch. Wash your hands with soap and water  after contact with the patch.   Regional Anesthesia Blocks  1. You may not be able to move or feel the blocked extremity after a regional anesthetic block. This may last may  last from 3-48 hours after placement, but it will go away. The length of time depends on the medication injected and your individual response to the medication. As the nerves start to wake up, you may experience tingling as the movement and feeling returns to your extremity. If the numbness and inability to move your extremity has not gone away after 48 hours, please call your surgeon.   2. The extremity that is blocked will need to be protected until the numbness is gone and the  strength has returned. Because you cannot feel it, you will need to take extra care to avoid injury. Because it may be weak, you may have difficulty moving it or using it. You may not know what position it is in without looking at it while the block is in effect.  3. For blocks in the legs and feet, returning to weight bearing and walking needs to be done carefully. You will need to wait until the numbness is entirely gone and the strength has returned. You should be able to move your leg and foot normally before you try and bear weight or walk. You will need someone to be with you when you first try to ensure you do not fall and possibly risk injury.  4. Bruising and tenderness at the needle site are common side effects and will resolve in a few days.  5. Persistent numbness or new problems with movement should be communicated to the surgeon or the [facility] [phone] [facility] [phone]).

## 2023-12-04 NOTE — H&P (Signed)
 ORTHOPAEDIC SURGERY H&P  Subjective:  The patient presents for right ankle fx.   Past Medical History:  Diagnosis Date   Acute pancreatitis 12/29/2021   Alcohol abuse 06/24/2022   Alcohol induced acute pancreatitis 06/10/2022   Alcohol use disorder, severe, dependence (HCC) 05/20/2018   Alcoholic hepatitis without ascites 06/19/2022   Anasarca 06/20/2022   Anxiety 2013   Generalized anxiety disorder 04/01/2013   History of hypertension 11/15/2021   Hypertension 2014   Pancreatitis    Warts, genital 07/21/2019    Past Surgical History:  Procedure Laterality Date   CESAREAN SECTION  03/18/2007     (Not in an outpatient encounter)    Allergies  Allergen Reactions   Lisinopril Swelling    Reports Lip swelling   Amlodipine Other (See Comments)    like I was going to pass out    Social History   Socioeconomic History   Marital status: Single    Spouse name: Not on file   Number of children: 1   Years of education: Not on file   Highest education level: Some college, no degree  Occupational History   Not on file  Tobacco Use   Smoking status: Every Day    Current packs/day: 0.50    Average packs/day: 0.5 packs/day for 10.0 years (5.0 ttl pk-yrs)    Types: Cigarettes   Smokeless tobacco: Never  Vaping Use   Vaping status: Never Used  Substance and Sexual Activity   Alcohol use: Not Currently    Alcohol/week: 10.0 standard drinks of alcohol    Types: 10 Cans of beer per week   Drug use: No   Sexual activity: Yes    Partners: Male    Birth control/protection: None  Other Topics Concern   Not on file  Social History Narrative   Lives in [place] with daughter   Has long term boyfriend.   Social Drivers of Corporate investment banker Strain: Not on file  Food Insecurity: No Food Insecurity (06/24/2022)   Hunger Vital Sign    Worried About Running Out of Food in the Last Year: Never true    Ran Out of Food in the Last Year: Never true   Transportation Needs: No Transportation Needs (06/24/2022)   PRAPARE - Administrator, Civil Service (Medical): No    Lack of Transportation (Non-Medical): No  Physical Activity: Inactive (09/02/2017)   Exercise Vital Sign    Days of Exercise per Week: 0 days    Minutes of Exercise per Session: 0 min  Stress: Stress Concern Present (09/02/2017)   [employer] of Occupational Health - Occupational Stress Questionnaire    Feeling of Stress : To some extent  Social Connections: Not on file  Intimate Partner Violence: Not At Risk (06/24/2022)   Humiliation, Afraid, Rape, and Kick questionnaire    Fear of Current or Ex-Partner: No    Emotionally Abused: No    Physically Abused: No    Sexually Abused: No     History reviewed. No pertinent family history.   Review of Systems Pertinent items are noted in HPI.  Objective: Vital signs in last 24 hours:    12/04/2023   12:57 PM 12/01/2023    3:08 PM 08/19/2023    8:13 AM  Vitals with BMI  Height 5' 6  5' 6  Weight 229 lbs  249 lbs 1 oz  BMI 36.98  40.22  Systolic  103 109  Diastolic  74 75  Pulse  106 81  EXAM: General: Well nourished, well developed. Awake, alert and oriented to time, place, person. Normal mood and affect. No apparent distress. Breathing room air.  Operative Lower Extremity: Alignment - Neutral Deformity - None Skin intact Tenderness to palpation - right ankle 5/5 TA, PT, GS, Per, EHL, FHL Sensation intact to light touch throughout Palpable DP and PT pulses Special testing: None  The contralateral foot/ankle was examined for comparison and noted to be neurovascularly intact with no localized deformity, swelling, or tenderness.  Imaging Review All images taken were independently reviewed by me.  Assessment/Plan: The clinical and radiographic findings were reviewed and discussed at length with the patient.  The patient presents for right ankle fx.  We spoke at length about the  natural course of these findings. We discussed nonoperative and operative treatment options in detail.  The risks and benefits were presented and reviewed. The risks due to suture/hardware failure/irritation (or if removing hardware inability to remove part/all of hardware, recurrent instability), new/persistent/recurrent infection, stiffness, nerve/vessel/tendon injury, nonunion/malunion of any fracture, wound healing issues, allograft usage, development of arthritis, failure of this surgery, possibility of external fixation in certain situations, possibility of delayed definitive surgery, need for further surgery, prolonged wound care including further soft tissue coverage procedures, thromboembolic events, anesthesia/medical complications/events perioperatively and beyond, amputation, death among others were discussed. The patient acknowledged the explanation and agreed to proceed with the plan.  Lillia Mountain  Orthopaedic Surgery [facility]

## 2023-12-07 ENCOUNTER — Encounter (HOSPITAL_BASED_OUTPATIENT_CLINIC_OR_DEPARTMENT_OTHER)
Admission: RE | Admit: 2023-12-07 | Discharge: 2023-12-07 | Disposition: A | Discharge status: Home / Self Care | Payer: MEDICAID | Source: Ambulatory Visit | Attending: Orthopaedic Surgery

## 2023-12-07 DIAGNOSIS — Z01810 Encounter for preprocedural cardiovascular examination: Secondary | ICD-10-CM | POA: Diagnosis not present

## 2023-12-07 DIAGNOSIS — Z01818 Encounter for other preprocedural examination: Secondary | ICD-10-CM | POA: Diagnosis present

## 2023-12-07 MED ORDER — CHLORHEXIDINE GLUCONATE 4 % EX SOLN: 60.0000 mL | Freq: Once | CUTANEOUS | Status: DC

## 2023-12-07 NOTE — Progress Notes (Signed)
Surgical soap given with instructions, pt verbalized understanding.  

## 2023-12-08 ENCOUNTER — Encounter: Payer: Self-pay | Admitting: Family Medicine

## 2023-12-09 ENCOUNTER — Other Ambulatory Visit: Payer: Self-pay

## 2023-12-09 ENCOUNTER — Ambulatory Visit (HOSPITAL_BASED_OUTPATIENT_CLINIC_OR_DEPARTMENT_OTHER)
Admission: RE | Admit: 2023-12-09 | Discharge: 2023-12-09 | Disposition: A | Discharge status: Home / Self Care | Payer: MEDICAID | Attending: Orthopaedic Surgery | Admitting: Orthopaedic Surgery

## 2023-12-09 ENCOUNTER — Encounter (HOSPITAL_BASED_OUTPATIENT_CLINIC_OR_DEPARTMENT_OTHER): Payer: Self-pay | Admitting: Orthopaedic Surgery

## 2023-12-09 ENCOUNTER — Ambulatory Visit (HOSPITAL_BASED_OUTPATIENT_CLINIC_OR_DEPARTMENT_OTHER): Payer: MEDICAID | Admitting: Anesthesiology

## 2023-12-09 ENCOUNTER — Other Ambulatory Visit: Payer: Self-pay | Admitting: Family Medicine

## 2023-12-09 ENCOUNTER — Ambulatory Visit (HOSPITAL_COMMUNITY): Payer: MEDICAID

## 2023-12-09 ENCOUNTER — Encounter (HOSPITAL_BASED_OUTPATIENT_CLINIC_OR_DEPARTMENT_OTHER)
Admission: RE | Disposition: A | Discharge status: Home / Self Care | Payer: Self-pay | Source: Home / Self Care | Attending: Orthopaedic Surgery

## 2023-12-09 DIAGNOSIS — I10 Essential (primary) hypertension: Secondary | ICD-10-CM | POA: Diagnosis not present

## 2023-12-09 DIAGNOSIS — S8261XA Displaced fracture of lateral malleolus of right fibula, initial encounter for closed fracture: Secondary | ICD-10-CM | POA: Insufficient documentation

## 2023-12-09 DIAGNOSIS — X58XXXA Exposure to other specified factors, initial encounter: Secondary | ICD-10-CM | POA: Diagnosis not present

## 2023-12-09 DIAGNOSIS — Z01818 Encounter for other preprocedural examination: Secondary | ICD-10-CM

## 2023-12-09 DIAGNOSIS — F17210 Nicotine dependence, cigarettes, uncomplicated: Secondary | ICD-10-CM | POA: Insufficient documentation

## 2023-12-09 DIAGNOSIS — S8262XD Displaced fracture of lateral malleolus of left fibula, subsequent encounter for closed fracture with routine healing: Secondary | ICD-10-CM | POA: Diagnosis not present

## 2023-12-09 HISTORY — PX: ORIF ANKLE FRACTURE: SHX5408

## 2023-12-09 LAB — POCT PREGNANCY, URINE: Preg Test, Ur: NEGATIVE

## 2023-12-09 SURGERY — OPEN REDUCTION INTERNAL FIXATION (ORIF) ANKLE FRACTURE
Anesthesia: General | Site: Ankle | Laterality: Right

## 2023-12-09 MED ORDER — PROPOFOL 10 MG/ML IV BOLUS
INTRAVENOUS | Status: DC | PRN
  Administered 2023-12-09: 125 ug/kg/min via INTRAVENOUS
  Administered 2023-12-09: 200 ug via INTRAVENOUS

## 2023-12-09 MED ORDER — GLYCOPYRROLATE PF 0.2 MG/ML IJ SOSY
PREFILLED_SYRINGE | INTRAMUSCULAR | Status: AC
  Filled 2023-12-09: qty 1

## 2023-12-09 MED ORDER — ROPIVACAINE HCL 5 MG/ML IJ SOLN
INTRAMUSCULAR | Status: DC | PRN
Start: 2023-12-09 — End: 2023-12-09
  Administered 2023-12-09: 30 mL via PERINEURAL

## 2023-12-09 MED ORDER — 0.9 % SODIUM CHLORIDE (POUR BTL) OPTIME
TOPICAL | Status: DC | PRN
  Administered 2023-12-09: 1000 mL

## 2023-12-09 MED ORDER — MIDAZOLAM HCL 2 MG/2ML IJ SOLN
INTRAMUSCULAR | Status: AC
  Filled 2023-12-09: qty 2

## 2023-12-09 MED ORDER — ONDANSETRON HCL 4 MG/2ML IJ SOLN
INTRAMUSCULAR | Status: DC | PRN
  Administered 2023-12-09: 4 mg via INTRAVENOUS

## 2023-12-09 MED ORDER — GLYCOPYRROLATE 0.2 MG/ML IJ SOLN
INTRAMUSCULAR | Status: DC | PRN
  Administered 2023-12-09: .2 mg via INTRAVENOUS

## 2023-12-09 MED ORDER — POVIDONE-IODINE 10 % EX SOLN
CUTANEOUS | Status: DC | PRN
  Administered 2023-12-09: 1 via TOPICAL

## 2023-12-09 MED ORDER — DEXAMETHASONE SODIUM PHOSPHATE 10 MG/ML IJ SOLN
INTRAMUSCULAR | Status: AC
  Filled 2023-12-09: qty 1

## 2023-12-09 MED ORDER — KETOROLAC TROMETHAMINE 30 MG/ML IJ SOLN
INTRAMUSCULAR | Status: AC
  Filled 2023-12-09: qty 1

## 2023-12-09 MED ORDER — KETOROLAC TROMETHAMINE 30 MG/ML IJ SOLN
INTRAMUSCULAR | Status: DC | PRN
  Administered 2023-12-09: 30 mg via INTRAVENOUS

## 2023-12-09 MED ORDER — PHENYLEPHRINE HCL (PRESSORS) 10 MG/ML IV SOLN
INTRAVENOUS | Status: DC | PRN
  Administered 2023-12-09: 160 ug via INTRAVENOUS
  Administered 2023-12-09: 100 ug via INTRAVENOUS

## 2023-12-09 MED ORDER — PHENYLEPHRINE 80 MCG/ML (10ML) SYRINGE FOR IV PUSH (FOR BLOOD PRESSURE SUPPORT)
PREFILLED_SYRINGE | INTRAVENOUS | Status: AC
  Filled 2023-12-09: qty 10

## 2023-12-09 MED ORDER — ONDANSETRON HCL 4 MG/2ML IJ SOLN
INTRAMUSCULAR | Status: AC
  Filled 2023-12-09: qty 2

## 2023-12-09 MED ORDER — FENTANYL CITRATE (PF) 100 MCG/2ML IJ SOLN
INTRAMUSCULAR | Status: DC | PRN
  Administered 2023-12-09: 25 ug via INTRAVENOUS
  Administered 2023-12-09: 50 ug via INTRAVENOUS
  Administered 2023-12-09: 25 ug via INTRAVENOUS

## 2023-12-09 MED ORDER — VANCOMYCIN HCL 500 MG IV SOLR
INTRAVENOUS | Status: DC | PRN
  Administered 2023-12-09: 500 mg via TOPICAL

## 2023-12-09 MED ORDER — MEPERIDINE HCL 25 MG/ML IJ SOLN: 6.2500 mg | INTRAMUSCULAR | Status: DC | PRN

## 2023-12-09 MED ORDER — MIDAZOLAM HCL 5 MG/5ML IJ SOLN
INTRAMUSCULAR | Status: DC | PRN
  Administered 2023-12-09 (×2): 1 mg via INTRAVENOUS

## 2023-12-09 MED ORDER — LACTATED RINGERS IV SOLN: INTRAVENOUS | Status: DC

## 2023-12-09 MED ORDER — CEFAZOLIN SODIUM-DEXTROSE 2-4 GM/100ML-% IV SOLN
INTRAVENOUS | Status: AC
  Filled 2023-12-09: qty 100

## 2023-12-09 MED ORDER — FENTANYL CITRATE (PF) 100 MCG/2ML IJ SOLN
100.0000 ug | Freq: Once | INTRAMUSCULAR | Status: AC
  Administered 2023-12-09: 100 ug via INTRAVENOUS

## 2023-12-09 MED ORDER — LIDOCAINE 2% (20 MG/ML) 5 ML SYRINGE
INTRAMUSCULAR | Status: DC | PRN
  Administered 2023-12-09: 40 mg via INTRAVENOUS

## 2023-12-09 MED ORDER — SODIUM CHLORIDE 0.9 % IV SOLN
12.5000 mg | INTRAVENOUS | Status: DC | PRN
  Filled 2023-12-09: qty 0.5

## 2023-12-09 MED ORDER — DEXMEDETOMIDINE HCL IN NACL 80 MCG/20ML IV SOLN
INTRAVENOUS | Status: DC | PRN
  Administered 2023-12-09: 4 ug via INTRAVENOUS
  Administered 2023-12-09: 8 ug via INTRAVENOUS
  Administered 2023-12-09: 4 ug via INTRAVENOUS

## 2023-12-09 MED ORDER — MIDAZOLAM HCL 2 MG/2ML IJ SOLN
2.0000 mg | Freq: Once | INTRAMUSCULAR | Status: AC
  Administered 2023-12-09: 2 mg via INTRAVENOUS

## 2023-12-09 MED ORDER — AMISULPRIDE (ANTIEMETIC) 5 MG/2ML IV SOLN: 10.0000 mg | Freq: Once | INTRAVENOUS | Status: DC | PRN

## 2023-12-09 MED ORDER — SEMAGLUTIDE-WEIGHT MANAGEMENT 1 MG/0.5ML ~~LOC~~ SOAJ
1.0000 mg | SUBCUTANEOUS | 0 refills | Status: DC
Start: 2024-02-05 — End: 2024-02-18

## 2023-12-09 MED ORDER — LIDOCAINE 2% (20 MG/ML) 5 ML SYRINGE
INTRAMUSCULAR | Status: AC
  Filled 2023-12-09: qty 5

## 2023-12-09 MED ORDER — PHENYLEPHRINE HCL-NACL 20-0.9 MG/250ML-% IV SOLN
INTRAVENOUS | Status: DC | PRN
  Administered 2023-12-09: 40 ug/min via INTRAVENOUS

## 2023-12-09 MED ORDER — DEXAMETHASONE SODIUM PHOSPHATE 10 MG/ML IJ SOLN
INTRAMUSCULAR | Status: DC | PRN
  Administered 2023-12-09: 8 mg via INTRAVENOUS

## 2023-12-09 MED ORDER — FENTANYL CITRATE (PF) 100 MCG/2ML IJ SOLN
INTRAMUSCULAR | Status: AC
  Filled 2023-12-09: qty 2

## 2023-12-09 MED ORDER — CEFAZOLIN SODIUM-DEXTROSE 2-4 GM/100ML-% IV SOLN
2.0000 g | INTRAVENOUS | Status: AC
  Administered 2023-12-09: 2 g via INTRAVENOUS

## 2023-12-09 MED ORDER — FENTANYL CITRATE (PF) 100 MCG/2ML IJ SOLN
INTRAMUSCULAR | Status: AC
Start: 2023-12-09 — End: 2023-12-09
  Filled 2023-12-09: qty 2

## 2023-12-09 MED ORDER — HYDROMORPHONE HCL 1 MG/ML IJ SOLN: 0.2500 mg | INTRAMUSCULAR | Status: DC | PRN

## 2023-12-09 MED ORDER — OXYCODONE HCL 5 MG PO TABS: 5.0000 mg | ORAL_TABLET | Freq: Once | ORAL | Status: DC | PRN

## 2023-12-09 MED ORDER — OXYCODONE HCL 5 MG/5ML PO SOLN: 5.0000 mg | Freq: Once | ORAL | Status: DC | PRN

## 2023-12-09 SURGICAL SUPPLY — 58 items
BIT DRILL 2.5X2.75 QC CALB (BIT) IMPLANT
BLADE SURG 15 STRL LF DISP TIS (BLADE) ×8 IMPLANT
BNDG COHESIVE 4X5 TAN STRL LF (GAUZE/BANDAGES/DRESSINGS) IMPLANT
BNDG COMPR ESMARK 6X3 LF (GAUZE/BANDAGES/DRESSINGS) IMPLANT
BNDG ELASTIC 6X10 VLCR STRL LF (GAUZE/BANDAGES/DRESSINGS) ×2 IMPLANT
BNDG GAUZE DERMACEA FLUFF 4 (GAUZE/BANDAGES/DRESSINGS) ×2 IMPLANT
CANISTER SUCT 1200ML W/VALVE (MISCELLANEOUS) IMPLANT
CHLORAPREP W/TINT 26 (MISCELLANEOUS) ×2 IMPLANT
COVER BACK TABLE 60X90IN (DRAPES) ×2 IMPLANT
CUFF TRNQT CYL 34X4.125X (TOURNIQUET CUFF) ×2 IMPLANT
DRAPE C-ARM 42X72 X-RAY (DRAPES) ×2 IMPLANT
DRAPE C-ARMOR (DRAPES) ×2 IMPLANT
DRAPE EXTREMITY T 121X128X90 (DISPOSABLE) ×2 IMPLANT
DRAPE IMP U-DRAPE 54X76 (DRAPES) ×2 IMPLANT
DRAPE U-SHAPE 47X51 STRL (DRAPES) ×2 IMPLANT
DRIVER RETENTION T15 LONG (ORTHOPEDIC DISPOSABLE SUPPLIES) IMPLANT
DRSG MEPITEL 4X7.2 (GAUZE/BANDAGES/DRESSINGS) ×2 IMPLANT
ELECTRODE REM PT RTRN 9FT ADLT (ELECTROSURGICAL) ×2 IMPLANT
GAUZE PAD ABD 8X10 STRL (GAUZE/BANDAGES/DRESSINGS) ×2 IMPLANT
GAUZE SPONGE 4X4 12PLY STRL (GAUZE/BANDAGES/DRESSINGS) ×2 IMPLANT
GLOVE BIOGEL PI IND STRL 8 (GLOVE) ×2 IMPLANT
GLOVE SURG SS PI 7.5 STRL IVOR (GLOVE) ×4 IMPLANT
GOWN STRL REUS W/ TWL LRG LVL3 (GOWN DISPOSABLE) ×4 IMPLANT
KWIRE ALPS MXV 1.6X6 ZI (WIRE) IMPLANT
MARKER SKIN DUAL TIP RULER LAB (MISCELLANEOUS) IMPLANT
NDL HYPO 25X1 1.5 SAFETY (NEEDLE) IMPLANT
NEEDLE HYPO 25X1 1.5 SAFETY (NEEDLE) IMPLANT
NS IRRIG 1000ML POUR BTL (IV SOLUTION) ×2 IMPLANT
PACK BASIN DAY SURGERY FS (CUSTOM PROCEDURE TRAY) ×2 IMPLANT
PAD CAST 4YDX4 CTTN HI CHSV (CAST SUPPLIES) ×2 IMPLANT
PADDING CAST ABS COTTON 4X4 ST (CAST SUPPLIES) IMPLANT
PADDING CAST SYNTHETIC 4X4 STR (CAST SUPPLIES) ×6 IMPLANT
PADDING CAST SYNTHETIC 6X4 NS (CAST SUPPLIES) ×4 IMPLANT
PENCIL SMOKE EVACUATOR (MISCELLANEOUS) ×2 IMPLANT
PLATE 1/3 TUBULAR 6H (Plate) IMPLANT
SCREW LOCK MDS 3.5X14 (Screw) IMPLANT
SCREW NLOCK 4X60 (Plate) IMPLANT
SCREW NLOCK ALPS 3.5X14 (Screw) IMPLANT
SHEET MEDIUM DRAPE 40X70 STRL (DRAPES) ×2 IMPLANT
SLEEVE SCD COMPRESS KNEE MED (STOCKING) ×2 IMPLANT
SPIKE FLUID TRANSFER (MISCELLANEOUS) IMPLANT
SPLINT FIBERGLASS 4X30 (CAST SUPPLIES) ×4 IMPLANT
SPONGE T-LAP 18X18 ~~LOC~~+RFID (SPONGE) ×2 IMPLANT
STAPLER SKIN PROX WIDE 3.9 (STAPLE) ×2 IMPLANT
STOCKINETTE 6 STRL (DRAPES) IMPLANT
STOCKINETTE ORTHO 6X25 (MISCELLANEOUS) ×2 IMPLANT
SUCTION TUBE FRAZIER 10FR DISP (SUCTIONS) IMPLANT
SUT ETHILON 2 0 FS 18 (SUTURE) ×4 IMPLANT
SUT MNCRL AB 3-0 PS2 18 (SUTURE) IMPLANT
SUT PDS II 3-0 CT2 27 ABS (SUTURE) IMPLANT
SUT VIC AB 0 CT1 27XBRD ANBCTR (SUTURE) IMPLANT
SUT VIC AB 2-0 CT1 TAPERPNT 27 (SUTURE) ×2 IMPLANT
SUT VIC AB 3-0 SH 27X BRD (SUTURE) IMPLANT
SYR BULB IRRIG 60ML STRL (SYRINGE) ×2 IMPLANT
SYR CONTROL 10ML LL (SYRINGE) IMPLANT
TOWEL GREEN STERILE FF (TOWEL DISPOSABLE) ×4 IMPLANT
TUBE CONNECTING 20X1/4 (TUBING) IMPLANT
UNDERPAD 30X36 HEAVY ABSORB (UNDERPADS AND DIAPERS) ×2 IMPLANT

## 2023-12-09 NOTE — Anesthesia Preprocedure Evaluation (Signed)
 Anesthesia Evaluation  Patient identified by MRN, date of birth, ID band Patient awake    Reviewed: Allergy & Precautions, H&P , NPO status , Patient's Chart, lab work & pertinent test results  Airway Mallampati: II  TM Distance: >3 FB Neck ROM: Full    Dental no notable dental hx.    Pulmonary neg pulmonary ROS, Current Smoker and Patient abstained from smoking.   Pulmonary exam normal breath sounds clear to auscultation       Cardiovascular hypertension, Pt. on medications negative cardio ROS Normal cardiovascular exam Rhythm:Regular Rate:Normal     Neuro/Psych   Anxiety Depression    negative neurological ROS  negative psych ROS   GI/Hepatic Neg liver ROS,GERD  ,,  Endo/Other  negative endocrine ROS    Renal/GU negative Renal ROS  negative genitourinary   Musculoskeletal negative musculoskeletal ROS (+)    Abdominal  (+) + obese  Peds negative pediatric ROS (+)  Hematology negative hematology ROS (+)   Anesthesia Other Findings   Reproductive/Obstetrics negative OB ROS                              Anesthesia Physical Anesthesia Plan  ASA: 2  Anesthesia Plan: General   Post-op Pain Management: Regional block* and Minimal or no pain anticipated   Induction: Intravenous  PONV Risk Score and Plan: 2 and Ondansetron , Midazolam  and Treatment may vary due to age or medical condition  Airway Management Planned: LMA  Additional Equipment:   Intra-op Plan:   Post-operative Plan: Extubation in OR  Informed Consent: I have reviewed the patients History and Physical, chart, labs and discussed the procedure including the risks, benefits and alternatives for the proposed anesthesia with the patient or authorized representative who has indicated his/her understanding and acceptance.     Dental advisory given  Plan Discussed with: CRNA  Anesthesia Plan Comments:          Anesthesia Quick Evaluation

## 2023-12-09 NOTE — Op Note (Signed)
 12/09/2023  3:14 PM   PATIENT: Veronica Calhoun  38 y.o. female  MRN: [id number]   PRE-OPERATIVE DIAGNOSIS:   Closed displaced fracture of lateral malleolus of right fibula, initial encounter   POST-OPERATIVE DIAGNOSIS:   Closed displaced fracture of lateral malleolus of right fibula, initial encounter   PROCEDURE: 1] Right distal fibula ORIF 2] Right ankle syndesmosis ORIF   SURGEON:  Lillia Mountain, MD   ASSISTANT: None   ANESTHESIA: General, regional   EBL: Minimal   TOURNIQUET:    Total Tourniquet Time Documented: Thigh (Right) - 31 minutes Total: Thigh (Right) - 31 minutes    COMPLICATIONS: None apparent   DISPOSITION: Extubated, awake and stable to recovery.   INDICATION FOR PROCEDURE: The patient presented with above diagnosis.  We discussed the diagnosis, alternative treatment options, risks and benefits of the above surgical intervention, as well as alternative non-operative treatments. All questions/concerns were addressed and the patient/family demonstrated appropriate understanding of the diagnosis, the procedure, the postoperative course, and overall prognosis. The patient wished to proceed with surgical intervention and signed an informed surgical consent as such, in each others presence prior to surgery.   PROCEDURE IN DETAIL: After preoperative consent was obtained and the correct operative site was identified, the patient was brought to the operating room supine on stretcher and transferred onto operating table. General anesthesia was induced. Preoperative antibiotics were administered. Surgical timeout was taken. The patient was then positioned supine with an ipsilateral hip bump. The operative lower extremity was prepped and draped in standard sterile fashion with a tourniquet around the thigh. The extremity was exsanguinated and the tourniquet was inflated to 275 mmHg.  A standard lateral incision was made over the distal fibula.  Dissection was carried down to the level of the fibula and the fracture site identified. The superficial peroneal nerve was identified and protected throughout the procedure. The fibula was noted to be shortened with interposed periosteum. The fibula was brought out to length. The fibula fracture was debrided and the edges defined to achieve cortical read. Reduction maneuver was performed using pointed reduction forceps and lobster forceps. In this manner, the fibula length was restored and fracture reduced. A lag screw was not placed given the orientation of fracture lines and comminution. Due to poor bone quality and extensive comminution at the fracture site, it was decided to use a locking distal fibula plate. We then selected a Zimmer locking plate to match the anatomy of the distal fibula and placed it laterally. This was implanted under intraoperative fluoroscopy with a combination of distal locking screw and proximal cortical & locking screws.  A manual external rotation stress radiograph was obtained and demonstrated widening of the ankle mortise. Given this intraoperative finding as well as preoperative subluxation, it was decided to reduce and fix the syndesmosis. Therefore a nonlocking quadricortical hex head 4.0 mm screw was implanted through the fibula plate in appropriate fashion to fix the syndesmosis. Screw position was verified along anteromedial tibial cortex by fluoroscopy. A repeat stress radiograph showed complete stability of the ankle mortise to testing.  The surgical sites were thoroughly irrigated. The tourniquet was deflated and hemostasis achieved. Betadine  and vancomycin  powder were applied. The deep layers were closed using 2-0 vicryl. The skin was closed without tension.    The leg was cleaned with saline and sterile dressings with gauze were applied. A well padded bulky short leg splint was applied. The patient was awakened from anesthesia and transported to the recovery room in  stable  condition.    FOLLOW UP PLAN: -transfer to PACU, then home -strict NWB operative extremity, maximum elevation -maintain short leg splint until follow up -DVT ppx: Aspirin 81 mg twice daily while NWB -follow up as outpatient within 7-10 days for wound check with exchange of short leg splint to short leg cast -sutures out in 2-3 weeks in outpatient office   RADIOGRAPHS: AP, lateral, oblique and stress radiographs of the right ankle were obtained intraoperatively. These showed interval reduction and fixation of the fractures. Manual stress radiographs were taken and the joints were noted to be stable following fixation. All hardware is appropriately positioned and of the appropriate lengths. No other acute injuries are noted.   Lillia Mountain [facility]

## 2023-12-09 NOTE — Anesthesia Procedure Notes (Signed)
 Anesthesia Regional Block: Popliteal block   Pre-Anesthetic Checklist: , timeout performed,  Correct Patient, Correct Site, Correct Laterality,  Correct Procedure, Correct Position, site marked,  Risks and benefits discussed,  Surgical consent,  Pre-op evaluation,  At surgeon's request and post-op pain management  Laterality: Right  Prep: chloraprep       Needles:  Injection technique: Single-shot  Needle Type: Stimiplex     Needle Length: 9cm  Needle Gauge: 21     Additional Needles:   Procedures:,,,, ultrasound used (permanent image in chart),,    Narrative:  Start time: 12/09/2023 10:21 AM End time: 12/09/2023 10:26 AM Injection made incrementally with aspirations every 5 mL.  Performed by: Personally  Anesthesiologist: Cleotilde Butler Dade, MD

## 2023-12-09 NOTE — H&P (Signed)

## 2023-12-09 NOTE — Telephone Encounter (Signed)
 Hello Veronica Calhoun,  Since you've been off Wegovy  for about three weeks, restarting at the full 1.7 mg dose may increase your risk for side effects, such as nausea or vomiting.  To minimize discomfort and safely resume treatment, in your case, I will restart at 1.0 mg week for 4 weeks. Medication sent to your pharmacy.

## 2023-12-09 NOTE — Progress Notes (Signed)
 Assisted Dr. Hyacinth Meeker with right, popliteal, ultrasound guided block. Side rails up, monitors on throughout procedure. See vital signs in flow sheet. Tolerated Procedure well. ?

## 2023-12-09 NOTE — Transfer of Care (Signed)
 Immediate Anesthesia Transfer of Care Note  Patient: Veronica Calhoun  Procedure(s) Performed: OPEN REDUCTION INTERNAL FIXATION (ORIF) ANKLE FRACTURE (Right: Ankle)  Patient Location: PACU  Anesthesia Type:GA combined with regional for post-op pain  Level of Consciousness: drowsy and responds to stimulation  Airway & Oxygen Therapy: Patient Spontanous Breathing and Patient connected to face mask oxygen  Post-op Assessment: Report given to RN and Post -op Vital signs reviewed and stable  Post vital signs: Reviewed and stable  Last Vitals:  Vitals Value Taken Time  BP 96/57 12/09/23 12:48  Temp    Pulse 79 12/09/23 12:50  Resp 13 12/09/23 12:50  SpO2 97 % 12/09/23 12:50  Vitals shown include unfiled device data.  Last Pain:  Vitals:   12/09/23 1248  TempSrc:   PainSc: Asleep      Patients Stated Pain Goal: 4 (12/09/23 9047)  Complications: No notable events documented.

## 2023-12-09 NOTE — Anesthesia Procedure Notes (Signed)
 Procedure Name: LMA Insertion Date/Time: 12/09/2023 11:49 AM  Performed by: Denton Niels CROME, CRNAPre-anesthesia Checklist: Patient identified, Emergency Drugs available, Suction available, Patient being monitored and Timeout performed Patient Re-evaluated:Patient Re-evaluated prior to induction Oxygen Delivery Method: Circle system utilized Preoxygenation: Pre-oxygenation with 100% oxygen Induction Type: IV induction Ventilation: Mask ventilation without difficulty LMA: LMA inserted LMA Size: 4.0 Number of attempts: 1 Placement Confirmation: positive ETCO2 Dental Injury: Teeth and Oropharynx as per pre-operative assessment

## 2023-12-10 ENCOUNTER — Encounter (HOSPITAL_BASED_OUTPATIENT_CLINIC_OR_DEPARTMENT_OTHER): Payer: Self-pay | Admitting: Orthopaedic Surgery

## 2023-12-10 ENCOUNTER — Other Ambulatory Visit: Payer: Self-pay | Admitting: Family Medicine

## 2023-12-10 DIAGNOSIS — I10 Essential (primary) hypertension: Secondary | ICD-10-CM

## 2023-12-10 NOTE — Anesthesia Postprocedure Evaluation (Signed)
 Anesthesia Post Note  Patient: Veronica Calhoun  Procedure(s) Performed: OPEN REDUCTION INTERNAL FIXATION (ORIF) ANKLE FRACTURE (Right: Ankle)     Patient location during evaluation: PACU Anesthesia Type: General Level of consciousness: awake and alert Pain management: pain level controlled Vital Signs Assessment: post-procedure vital signs reviewed and stable Respiratory status: spontaneous breathing, nonlabored ventilation and respiratory function stable Cardiovascular status: blood pressure returned to baseline and stable Postop Assessment: no apparent nausea or vomiting Anesthetic complications: no   No notable events documented.  Last Vitals:  Vitals:   12/09/23 1308 12/09/23 1347  BP: 117/76 94/77  Pulse: 87 72  Resp: 20 20  Temp:  (!) 36.2 C  SpO2: 100% 97%    Last Pain:  Vitals:   12/09/23 1347  TempSrc: Temporal  PainSc:                  Butler Levander Pinal

## 2023-12-11 ENCOUNTER — Other Ambulatory Visit (HOSPITAL_COMMUNITY): Payer: Self-pay

## 2023-12-23 ENCOUNTER — Telehealth (INDEPENDENT_AMBULATORY_CARE_PROVIDER_SITE_OTHER): Payer: MEDICAID | Admitting: Family Medicine

## 2023-12-23 VITALS — Ht 66.0 in | Wt 229.0 lb

## 2023-12-23 DIAGNOSIS — I10 Essential (primary) hypertension: Secondary | ICD-10-CM | POA: Diagnosis not present

## 2023-12-23 DIAGNOSIS — G479 Sleep disorder, unspecified: Secondary | ICD-10-CM | POA: Diagnosis not present

## 2023-12-23 DIAGNOSIS — E038 Other specified hypothyroidism: Secondary | ICD-10-CM

## 2023-12-23 DIAGNOSIS — E039 Hypothyroidism, unspecified: Secondary | ICD-10-CM

## 2023-12-23 MED ORDER — TRAZODONE HCL 50 MG PO TABS: 50.0000 mg | ORAL_TABLET | Freq: Every evening | ORAL | 3 refills | Status: DC | PRN

## 2023-12-23 NOTE — Progress Notes (Signed)
 Virtual Visit via Video Note  I connected with Veronica Calhoun on 12/23/23 at 10:40 AM EDT by a video enabled telemedicine application and verified that I am speaking with the correct person using two identifiers.  Patient Location: Home Provider Location: Office/Clinic  I discussed the limitations, risks, security, and privacy concerns of performing an evaluation and management service by video and the availability of in person appointments. I also discussed with the patient that there may be a patient responsible charge related to this service. The patient expressed understanding and agreed to proceed.  Subjective: PCP: Veronica Wilhelmena Lloyd Hilario, FNP  Chief Complaint  Patient presents with   Hypertension    4 month follow up. Has concerns about low bp, has ankle surgery 2 weeks ago and bp was in low 80s systolic    At-Home Blood Pressure:  Left arm: 149/85 mmHg  Right arm: 132/97 mmHg  Hypertension: The patient has a chronic history of hypertension, with blood pressure readings that have been variable since onset. Pertinent negatives include no chest pain or headaches; the patient does report occasional dizziness. Risk factors for coronary artery disease include obesity and exposure to tobacco. Previous treatments have included beta blockers and angiotensin receptor blockers, which have provided mild-moderate improvement. The patient is compliant with the current regimen. There is no history of kidney disease.      ROS: Per HPI  Current Outpatient Medications:    acetaminophen  (TYLENOL ) 500 MG tablet, Take 500 mg by mouth every 6 (six) hours as needed., Disp: , Rfl:    aspirin EC 81 MG tablet, Take 81 mg by mouth daily., Disp: , Rfl:    Aspirin-Salicylamide-Caffeine (BC HEADACHE) 325-95-16 MG TABS, Take 1 Package by mouth daily as needed (for pain)., Disp: , Rfl:    Blood Pressure Monitoring (BLOOD PRESSURE KIT) DEVI, Take blood pressure reading 2 times daily., Disp: 1  each, Rfl: 0   cloNIDine  (CATAPRES ) 0.2 MG tablet, TAKE 1 TABLET(0.2 MG) BY MOUTH THREE TIMES DAILY, Disp: 90 tablet, Rfl: 3   gabapentin  (NEURONTIN ) 300 MG capsule, Take 1 capsule (300 mg total) by mouth 3 (three) times daily., Disp: 90 capsule, Rfl: 3   hydrOXYzine  (VISTARIL ) 25 MG capsule, TAKE 1 CAPSULE(25 MG) BY MOUTH AT BEDTIME, Disp: 30 capsule, Rfl: 0   metoprolol  succinate (TOPROL -XL) 50 MG 24 hr tablet, Take 1 tablet (50 mg total) by mouth daily. Take with or immediately following a meal., Disp: 90 tablet, Rfl: 3   olmesartan -hydrochlorothiazide  (BENICAR  HCT) 40-12.5 MG tablet, TAKE 1 TABLET BY MOUTH DAILY, Disp: 90 tablet, Rfl: 1   oxyCODONE  (OXY IR/ROXICODONE ) 5 MG immediate release tablet, Take 5 mg by mouth every 6 (six) hours as needed., Disp: , Rfl:    rosuvastatin  (CRESTOR ) 5 MG tablet, TAKE 1 TABLET(5 MG) BY MOUTH DAILY, Disp: 90 tablet, Rfl: 1   Semaglutide -Weight Management 1 MG/0.5ML SOAJ, Inject 1 mg into the skin once a week for 28 days., Disp: 2 mL, Rfl: 0   traMADol  (ULTRAM ) 50 MG tablet, Take 1 tablet (50 mg total) by mouth every 12 (twelve) hours as needed., Disp: 8 tablet, Rfl: 0   albuterol  (VENTOLIN  HFA) 108 (90 Base) MCG/ACT inhaler, Inhale 2 puffs into the lungs every 6 (six) hours as needed for wheezing or shortness of breath. (Patient not taking: Reported on 12/23/2023), Disp: 8 g, Rfl: 2   [START ON 02/05/2024] semaglutide -weight management (WEGOVY ) 1 MG/0.5ML SOAJ SQ injection, Inject 1 mg into the skin once a week for 28  days., Disp: 2 mL, Rfl: 0   traZODone  (DESYREL ) 50 MG tablet, Take 1 tablet (50 mg total) by mouth at bedtime as needed for sleep., Disp: 30 tablet, Rfl: 3  Observations/Objective: Today's Vitals   12/23/23 1047  Weight: 229 lb (103.9 kg)  Height: 5' 6 (1.676 m)   Physical Exam Patient is alert and no acute distress noted.   Assessment and Plan: Primary hypertension -     CMP14+EGFR  TSH (thyroid -stimulating hormone deficiency) -     TSH  + free T4  Sleeping difficulty -     traZODone  HCl; Take 1 tablet (50 mg total) by mouth at bedtime as needed for sleep.  Dispense: 30 tablet; Refill: 3  Essential hypertension Assessment & Plan: Continue Metoprolol  50 mg once daily Olmesartan -hydrochlorothiazide  40-12.5 mg and Clonidine  0.2 mg twice daily  Follow up in 1 week via my with at home blood pressure to monitor trends     Follow Up Instructions: No follow-ups on file.   I discussed the assessment and treatment plan with the patient. The patient was provided an opportunity to ask questions, and all were answered. The patient agreed with the plan and demonstrated an understanding of the instructions.   The patient was advised to call back or seek an in-person evaluation if the symptoms worsen or if the condition fails to improve as anticipated.  The above assessment and management plan was discussed with the patient. The patient verbalized understanding of and has agreed to the management plan.   Calhoun Kidd Wilhelmena Falter, FNP

## 2023-12-23 NOTE — Assessment & Plan Note (Signed)
 Continue Metoprolol  50 mg once daily Olmesartan -hydrochlorothiazide  40-12.5 mg and Clonidine  0.2 mg twice daily  Follow up in 1 week via my with at home blood pressure to monitor trends

## 2024-01-18 ENCOUNTER — Encounter: Payer: Self-pay | Admitting: Family Medicine

## 2024-01-19 ENCOUNTER — Other Ambulatory Visit: Payer: Self-pay

## 2024-01-19 ENCOUNTER — Telehealth: Payer: Self-pay

## 2024-01-19 ENCOUNTER — Other Ambulatory Visit: Payer: Self-pay | Admitting: Family Medicine

## 2024-01-19 MED ORDER — LEVONORGESTREL 1.5 MG PO TABS: 1.5000 mg | ORAL_TABLET | Freq: Once | ORAL | 0 refills | Status: AC

## 2024-01-19 NOTE — Telephone Encounter (Signed)
 Copied from CRM [id number]. Topic: General - Other >> Jan 19, 2024  2:53 PM Tobias L wrote: Reason for CRM: Patient following up on mychart message sent yesterday to provider. Patient inquiring if the plan b  could be sent in, today would be her last day to be able to take it.   Requesting callback as soon as possible: [phone]

## 2024-01-20 NOTE — Telephone Encounter (Signed)
 Your lab orders are still in the system. You may walk in for labs between 8-11 AM or 1-4 PM

## 2024-01-31 ENCOUNTER — Encounter (HOSPITAL_BASED_OUTPATIENT_CLINIC_OR_DEPARTMENT_OTHER): Payer: Self-pay | Admitting: Emergency Medicine

## 2024-01-31 ENCOUNTER — Emergency Department (HOSPITAL_BASED_OUTPATIENT_CLINIC_OR_DEPARTMENT_OTHER)
Admission: EM | Admit: 2024-01-31 | Discharge: 2024-01-31 | Disposition: A | Discharge status: Home / Self Care | Payer: MEDICAID | Attending: Emergency Medicine | Admitting: Emergency Medicine

## 2024-01-31 ENCOUNTER — Other Ambulatory Visit: Payer: Self-pay

## 2024-01-31 ENCOUNTER — Emergency Department (HOSPITAL_BASED_OUTPATIENT_CLINIC_OR_DEPARTMENT_OTHER): Payer: MEDICAID

## 2024-01-31 DIAGNOSIS — I10 Essential (primary) hypertension: Secondary | ICD-10-CM | POA: Insufficient documentation

## 2024-01-31 DIAGNOSIS — Z794 Long term (current) use of insulin: Secondary | ICD-10-CM | POA: Insufficient documentation

## 2024-01-31 DIAGNOSIS — N12 Tubulo-interstitial nephritis, not specified as acute or chronic: Secondary | ICD-10-CM | POA: Insufficient documentation

## 2024-01-31 DIAGNOSIS — R000 Tachycardia, unspecified: Secondary | ICD-10-CM | POA: Diagnosis not present

## 2024-01-31 DIAGNOSIS — Z79899 Other long term (current) drug therapy: Secondary | ICD-10-CM | POA: Insufficient documentation

## 2024-01-31 DIAGNOSIS — R109 Unspecified abdominal pain: Secondary | ICD-10-CM | POA: Diagnosis present

## 2024-01-31 DIAGNOSIS — Z7982 Long term (current) use of aspirin: Secondary | ICD-10-CM | POA: Diagnosis not present

## 2024-01-31 LAB — CBC
HCT: 33.9 % — ABNORMAL LOW (ref 36.0–46.0)
Hemoglobin: 11.9 g/dL — ABNORMAL LOW (ref 12.0–15.0)
MCH: 30.4 pg (ref 26.0–34.0)
MCHC: 35.1 g/dL (ref 30.0–36.0)
MCV: 86.7 fL (ref 80.0–100.0)
Platelets: 227 K/uL (ref 150–400)
RBC: 3.91 MIL/uL (ref 3.87–5.11)
RDW: 14 % (ref 11.5–15.5)
WBC: 13.5 K/uL — ABNORMAL HIGH (ref 4.0–10.5)
nRBC: 0 % (ref 0.0–0.2)

## 2024-01-31 LAB — HEPATIC FUNCTION PANEL
ALT: 24 U/L (ref 0–44)
AST: 25 U/L (ref 15–41)
Albumin: 4.3 g/dL (ref 3.5–5.0)
Alkaline Phosphatase: 101 U/L (ref 38–126)
Bilirubin, Direct: 0.3 mg/dL — ABNORMAL HIGH (ref 0.0–0.2)
Indirect Bilirubin: 0.4 mg/dL (ref 0.3–0.9)
Total Bilirubin: 0.7 mg/dL (ref 0.0–1.2)
Total Protein: 8.9 g/dL — ABNORMAL HIGH (ref 6.5–8.1)

## 2024-01-31 LAB — BASIC METABOLIC PANEL WITH GFR
Anion gap: 18 — ABNORMAL HIGH (ref 5–15)
Anion gap: 16 — ABNORMAL HIGH (ref 5–15)
BUN: 15 mg/dL (ref 6–20)
BUN: 13 mg/dL (ref 6–20)
CO2: 18 mmol/L — ABNORMAL LOW (ref 22–32)
CO2: 20 mmol/L — ABNORMAL LOW (ref 22–32)
Calcium: 9.9 mg/dL (ref 8.9–10.3)
Calcium: 10 mg/dL (ref 8.9–10.3)
Chloride: 96 mmol/L — ABNORMAL LOW (ref 98–111)
Chloride: 97 mmol/L — ABNORMAL LOW (ref 98–111)
Creatinine, Ser: 1.39 mg/dL — ABNORMAL HIGH (ref 0.44–1.00)
Creatinine, Ser: 1.19 mg/dL — ABNORMAL HIGH (ref 0.44–1.00)
GFR, Estimated: 51 mL/min — ABNORMAL LOW (ref >60)
GFR, Estimated: >60 mL/min (ref >60)
Glucose, Bld: 100 mg/dL — ABNORMAL HIGH (ref 70–99)
Glucose, Bld: 96 mg/dL (ref 70–99)
Potassium: 4.1 mmol/L (ref 3.5–5.1)
Potassium: 4 mmol/L (ref 3.5–5.1)
Sodium: 132 mmol/L — ABNORMAL LOW (ref 135–145)
Sodium: 134 mmol/L — ABNORMAL LOW (ref 135–145)

## 2024-01-31 LAB — LIPASE, BLOOD: Lipase: 18 U/L (ref 11–51)

## 2024-01-31 LAB — URINALYSIS, ROUTINE W REFLEX MICROSCOPIC
Bilirubin Urine: NEGATIVE
Glucose, UA: NEGATIVE mg/dL
Hgb urine dipstick: NEGATIVE
Ketones, ur: NEGATIVE mg/dL
Nitrite: POSITIVE — AB
Specific Gravity, Urine: 1.008 (ref 1.005–1.030)
pH: 5.5 (ref 5.0–8.0)

## 2024-01-31 LAB — PREGNANCY, URINE: Preg Test, Ur: NEGATIVE

## 2024-01-31 MED ORDER — IOHEXOL 300 MG/ML  SOLN
100.0000 mL | Freq: Once | INTRAMUSCULAR | Status: AC | PRN
  Administered 2024-01-31: 100 mL via INTRAVENOUS

## 2024-01-31 MED ORDER — ONDANSETRON HCL 4 MG PO TABS: 4.0000 mg | ORAL_TABLET | Freq: Four times a day (QID) | ORAL | 0 refills | Status: AC

## 2024-01-31 MED ORDER — ONDANSETRON HCL 4 MG/2ML IJ SOLN
4.0000 mg | Freq: Once | INTRAMUSCULAR | Status: AC
  Administered 2024-01-31: 4 mg via INTRAVENOUS
  Filled 2024-01-31: qty 2

## 2024-01-31 MED ORDER — MORPHINE SULFATE (PF) 4 MG/ML IV SOLN
4.0000 mg | Freq: Once | INTRAVENOUS | Status: AC
  Administered 2024-01-31: 4 mg via INTRAVENOUS
  Filled 2024-01-31: qty 1

## 2024-01-31 MED ORDER — LACTATED RINGERS IV BOLUS
1000.0000 mL | Freq: Once | INTRAVENOUS | Status: AC
  Administered 2024-01-31: 1000 mL via INTRAVENOUS

## 2024-01-31 MED ORDER — SODIUM CHLORIDE 0.9 % IV SOLN
2.0000 g | Freq: Once | INTRAVENOUS | Status: AC
  Administered 2024-01-31: 2 g via INTRAVENOUS
  Filled 2024-01-31: qty 20

## 2024-01-31 MED ORDER — NAPROXEN 500 MG PO TABS: 500.0000 mg | ORAL_TABLET | Freq: Two times a day (BID) | ORAL | 0 refills | Status: AC

## 2024-01-31 MED ORDER — CEFPODOXIME PROXETIL 200 MG PO TABS: 200.0000 mg | ORAL_TABLET | Freq: Two times a day (BID) | ORAL | 0 refills | Status: AC

## 2024-01-31 MED ORDER — OXYCODONE-ACETAMINOPHEN 5-325 MG PO TABS: 1.0000 | ORAL_TABLET | Freq: Four times a day (QID) | ORAL | 0 refills | Status: DC | PRN

## 2024-01-31 MED ORDER — SULFAMETHOXAZOLE-TRIMETHOPRIM 800-160 MG PO TABS: 1.0000 | ORAL_TABLET | Freq: Two times a day (BID) | ORAL | 0 refills | Status: AC

## 2024-01-31 NOTE — ED Notes (Signed)
 LR stopped for the Antibiotic to run complete. LR and med not compatible.

## 2024-01-31 NOTE — ED Provider Notes (Cosign Needed Addendum)
 [place] EMERGENCY DEPARTMENT AT [facility] Provider Note   CSN: [id number] Arrival date & time: 01/31/24  1641     Patient presents with: Flank Pain   Veronica Calhoun is a 38 y.o. female.  Flank Pain Associated symptoms include abdominal pain.  Patient is a 38 year old female presenting ED today for concerns for right-sided flank pain and epigastric/right upper quadrant abdominal pain that started since yesterday.  Noted to have felt similarly with her previous pancreatitis in 2024.  Reporting that she did have 2 glasses of wine 5 days ago when she had previously been sober for a year.   Endorses nausea.  Recently underwent ORIF 1 month ago to right ankle.  Denies fever, headache, vision changes, chest pain, shortness of breath, cough, congestion, vomiting, diarrhea, hematochezia, melena, dysuria, hematuria, vaginal bleeding, vaginal discharge, vaginal pain, lower leg swelling.     Prior to Admission medications   Medication Sig Start Date End Date Taking? Authorizing Provider  cefpodoxime (VANTIN) 200 MG tablet Take 1 tablet (200 mg total) by mouth 2 (two) times daily for 14 days. 01/31/24 02/14/24 Yes Beola Terrall RAMAN, PA-C  naproxen (NAPROSYN) 500 MG tablet Take 1 tablet (500 mg total) by mouth 2 (two) times daily. 01/31/24  Yes Lauria Depoy S, PA-C  ondansetron  (ZOFRAN ) 4 MG tablet Take 1 tablet (4 mg total) by mouth every 6 (six) hours. 01/31/24  Yes Quindarius Cabello S, PA-C  oxyCODONE -acetaminophen  (PERCOCET/ROXICET) 5-325 MG tablet Take 1 tablet by mouth every 6 (six) hours as needed for severe pain (pain score 7-10). 01/31/24  Yes Welden Hausmann S, PA-C  acetaminophen  (TYLENOL ) 500 MG tablet Take 500 mg by mouth every 6 (six) hours as needed.    [provider]  aspirin EC 81 MG tablet Take 81 mg by mouth daily. 12/04/23   [provider]  Aspirin-Salicylamide-Caffeine (BC HEADACHE) 325-95-16 MG TABS Take 1 Package by mouth daily as needed (for pain).     [provider]  Blood Pressure Monitoring (BLOOD PRESSURE KIT) DEVI Take blood pressure reading 2 times daily. 12/23/22   Del Wilhelmena Falter, Hilario, FNP  cloNIDine  (CATAPRES ) 0.2 MG tablet TAKE 1 TABLET(0.2 MG) BY MOUTH THREE TIMES DAILY 10/28/23   Del Orbe Polanco, Iliana, FNP  gabapentin  (NEURONTIN ) 300 MG capsule Take 1 capsule (300 mg total) by mouth 3 (three) times daily. 03/20/23   Del Orbe Polanco, Hilario, FNP  hydrOXYzine  (VISTARIL ) 25 MG capsule TAKE 1 CAPSULE(25 MG) BY MOUTH AT BEDTIME 07/27/23   Del Orbe Polanco, Iliana, FNP  metoprolol  succinate (TOPROL -XL) 50 MG 24 hr tablet Take 1 tablet (50 mg total) by mouth daily. Take with or immediately following a meal. 03/20/23   Del Wilhelmena Falter, Hilario, FNP  olmesartan -hydrochlorothiazide  (BENICAR  HCT) 40-12.5 MG tablet TAKE 1 TABLET BY MOUTH DAILY 12/10/23   Del Orbe Polanco, Iliana, FNP  oxyCODONE  (OXY IR/ROXICODONE ) 5 MG immediate release tablet Take 5 mg by mouth every 6 (six) hours as needed. 12/04/23   [provider]  rosuvastatin  (CRESTOR ) 5 MG tablet TAKE 1 TABLET(5 MG) BY MOUTH DAILY 12/03/23   Del Orbe Polanco, Hilario, FNP  semaglutide -weight management (WEGOVY ) 1 MG/0.5ML SOAJ SQ injection Inject 1 mg into the skin once a week for 28 days. 02/05/24 03/04/24  Del Orbe Polanco, Iliana, FNP  traMADol  (ULTRAM ) 50 MG tablet Take 1 tablet (50 mg total) by mouth every 12 (twelve) hours as needed. 12/01/23   Leath-Warren, Etta PARAS, NP  traZODone  (DESYREL ) 50 MG tablet Take 1 tablet (  50 mg total) by mouth at bedtime as needed for sleep. 12/23/23   Del Wilhelmena Lloyd Sola, FNP    Allergies: Lisinopril and Amlodipine    Review of Systems  Gastrointestinal:  Positive for abdominal pain and nausea.  Genitourinary:  Positive for flank pain.  All other systems reviewed and are negative.   Updated Vital Signs BP 121/68 (BP Location: Right Arm)   Pulse 82   Temp 98.8 F (37.1 C) (Oral)   Resp 14   Ht 5' 6 (1.676 m)   Wt 99.8  kg   LMP 01/08/2024 (Approximate)   SpO2 100%   BMI 35.51 kg/m   Physical Exam Vitals and nursing note reviewed.  Constitutional:      General: She is not in acute distress.    Appearance: Normal appearance. She is not ill-appearing or diaphoretic.  HENT:     Head: Normocephalic and atraumatic.  Eyes:     General: No scleral icterus.       Right eye: No discharge.        Left eye: No discharge.     Extraocular Movements: Extraocular movements intact.     Conjunctiva/sclera: Conjunctivae normal.  Cardiovascular:     Rate and Rhythm: Regular rhythm. Tachycardia present.     Pulses: Normal pulses.     Heart sounds: Normal heart sounds. No murmur heard.    No friction rub. No gallop.  Pulmonary:     Effort: Pulmonary effort is normal. No respiratory distress.     Breath sounds: No stridor. No wheezing, rhonchi or rales.  Chest:     Chest wall: No tenderness.  Abdominal:     General: Abdomen is flat. There is no distension.     Palpations: Abdomen is soft.     Tenderness: There is abdominal tenderness in the right upper quadrant and epigastric area. There is right CVA tenderness. There is no left CVA tenderness, guarding or rebound. Negative signs include Murphy's sign, Rovsing's sign and McBurney's sign.  Musculoskeletal:        General: No swelling, deformity or signs of injury.     Cervical back: Normal range of motion. No rigidity.     Right lower leg: No edema.     Left lower leg: No edema.  Skin:    General: Skin is warm and dry.     Findings: No bruising, erythema or lesion.  Neurological:     General: No focal deficit present.     Mental Status: She is alert and oriented to person, place, and time. Mental status is at baseline.     Sensory: No sensory deficit.     Motor: No weakness.  Psychiatric:        Mood and Affect: Mood normal.     (all labs ordered are listed, but only abnormal results are displayed) Labs Reviewed  URINALYSIS, ROUTINE W REFLEX  MICROSCOPIC - Abnormal; Notable for the following components:      Result Value   APPearance HAZY (*)    Protein, ur TRACE (*)    Nitrite POSITIVE (*)    Leukocytes,Ua MODERATE (*)    Bacteria, UA MANY (*)    All other components within normal limits  BASIC METABOLIC PANEL WITH GFR - Abnormal; Notable for the following components:   Sodium 132 (*)    Chloride 96 (*)    CO2 18 (*)    Glucose, Bld 100 (*)    Creatinine, Ser 1.39 (*)    GFR, Estimated 51 (*)  Anion gap 18 (*)    All other components within normal limits  CBC - Abnormal; Notable for the following components:   WBC 13.5 (*)    Hemoglobin 11.9 (*)    HCT 33.9 (*)    All other components within normal limits  HEPATIC FUNCTION PANEL - Abnormal; Notable for the following components:   Total Protein 8.9 (*)    Bilirubin, Direct 0.3 (*)    All other components within normal limits  BASIC METABOLIC PANEL WITH GFR - Abnormal; Notable for the following components:   Sodium 134 (*)    Chloride 97 (*)    CO2 20 (*)    Creatinine, Ser 1.19 (*)    Anion gap 16 (*)    All other components within normal limits  URINE CULTURE  PREGNANCY, URINE  LIPASE, BLOOD    EKG: None  Radiology: CT ABDOMEN PELVIS W CONTRAST Result Date: 01/31/2024 CLINICAL DATA:  Right flank/right upper quadrant abdominal pain with nausea for 2 days. EXAM: CT ABDOMEN AND PELVIS WITH CONTRAST TECHNIQUE: Multidetector CT imaging of the abdomen and pelvis was performed using the standard protocol following bolus administration of intravenous contrast. RADIATION DOSE REDUCTION: This exam was performed according to the departmental dose-optimization program which includes automated exposure control, adjustment of the mA and/or kV according to patient size and/or use of iterative reconstruction technique. CONTRAST:  OMNIPAQUE  IOHEXOL  300 MG/ML  SOLN COMPARISON:  August 30, 2022 FINDINGS: Lower chest: No acute abnormality. Hepatobiliary: No focal liver  abnormality is seen. No gallstones, gallbladder wall thickening, or biliary dilatation. Pancreas: Unremarkable. No pancreatic ductal dilatation or surrounding inflammatory changes. Spleen: Normal in size without focal abnormality. Adrenals/Urinary Tract: Adrenal glands are unremarkable. Kidneys are normal, without renal calculi, focal lesion, or hydronephrosis. Bladder is unremarkable. Stomach/Bowel: Stomach is within normal limits. Appendix appears normal. No evidence of bowel wall thickening, distention, or inflammatory changes. Vascular/Lymphatic: No significant vascular findings are present. No enlarged abdominal or pelvic lymph nodes. Reproductive: Uterus and bilateral adnexa are unremarkable. Other: A very small, stable fat containing umbilical hernia is noted. No abdominopelvic ascites. Musculoskeletal: No acute or significant osseous findings. IMPRESSION: No acute or active process within the abdomen or pelvis. Electronically Signed   By: Suzen Dials M.D.   On: 01/31/2024 19:52    Procedures   Medications Ordered in the ED  morphine  (PF) 4 MG/ML injection 4 mg (4 mg Intravenous Given 01/31/24 1946)  iohexol  (OMNIPAQUE ) 300 MG/ML solution 100 mL (100 mLs Intravenous Contrast Given 01/31/24 1935)  lactated ringers  bolus 1,000 mL (0 mLs Intravenous Stopped 01/31/24 2130)  ondansetron  (ZOFRAN ) injection 4 mg (4 mg Intravenous Given 01/31/24 1948)  cefTRIAXone  (ROCEPHIN ) 2 g in sodium chloride  0.9 % 100 mL IVPB (0 g Intravenous Stopped 01/31/24 2117)  morphine  (PF) 4 MG/ML injection 4 mg (4 mg Intravenous Given 01/31/24 2133)                                    Medical Decision Making Amount and/or Complexity of Data Reviewed Labs: ordered. Radiology: ordered.  Risk Prescription drug management.   This patient is a 38 year old female who presents to the ED for concern of right flank pain and right upper quadrant abdominal pain/epigastric pain that began yesterday.  Noted that she had had  similar symptoms when she pancreatitis in the past.  Denies any urinary symptoms or vaginal symptoms.  On physical exam, patient is  in no acute distress, afebrile, alert and orient x 4, speaking in full sentences, nontachypneic,.  Noted to be tachycardic with heart rate of 120s.  Notably does have some mild epigastric, tenderness to palpation and right CVA tenderness.  Lower abdominal is nontender.  LCTAB, no lower edema.  Unremarkable exam otherwise.  Noted to be positive for UTI as well as having a mildly elevated creatinine at 1.1 by an anion gap of 16 with a white count 13.5.  CT scan was unremarkable, lipase is unremarkable.  Urine culture pending.  Patient is wishing to go home at this time.  Believe is reasonable as with having given fluid and on reevaluation noted that her tachycardia had resolved.  Noting that she had not had much to drink today or yesterday.  Provided Rocephin  for her today and will send home on cefpodoxime.  Also providing pain control and nausea control.  Provided strict return ER precaution.  Patient vital signs have remained stable throughout the course of patient's time in the ED. Low suspicion for any other emergent pathology at this time. I believe this patient is safe to be discharged. Provided strict return to ER precautions. Patient expressed agreement and understanding of plan. All questions were answered.  Poke with pharmacy after the fact who said that they cannot afford the cefotaxime, prescribed Bactrim instead.  Differential diagnoses prior to evaluation: The emergent differential diagnosis includes, but is not limited to,  Cholecystitis, hepatitis, appendicitis, PUD, pancreatitis, malignancy, right lower lobe pneumonia, pyelonephritis, urinary calculi, UTI, intestinal ischemia, IBD,  Fitz-Hugh-Curtis syndrome (with pelvic inflammatory disease), ectopic pregnancy, IUP, endometriosis, ovarian cyst  . This is not an exhaustive differential.   Past Medical  History / Co-morbidities / Social History: Pancreatitis, HTN, anxiety, alcohol abuse, anasarca, hepatitis alcoholic  Additional history: Chart reviewed. Pertinent results include:   Underwent open reduction internal fixation of ankle fracture on 12/09/2023 with Dr. Barton.  Noted previous admissions in 2024 for acute pancreatitis and sepsis secondary to pneumonia  Lab Tests/Imaging studies: I personally interpreted labs/imaging and the pertinent results include:   CBC notes a elevated white count 13.5 and anemia with hemoglobin 1.9 BMP notes an elevated creatinine of 1.19 and anion gap of 16. UA positive for UTI Hepatic function panel and lipase and urine pregnancy test were unremarkable. .  CT scan of abdomen was unremarkable for any acute abnormality.  I agree with the radiologist interpretation.   Medications: I ordered medication including morphine , LR, Rocephin , Zofran , morphine , naproxen, Percocet, cefpodoxime.  I have reviewed the patients home medicines and have made adjustments as needed.  Critical Interventions: None  Social Determinants of Health: Has good follow-up with PCP  Disposition: After consideration of the diagnostic results and the patients response to treatment, I feel that the patient would benefit from discharge and treat as above.   emergency department workup does not suggest an emergent condition requiring admission or immediate intervention beyond what has been performed at this time. The plan is: Antibiotics at home, symptomatic management at home, follow-up with PCP, return for new or worsening symptoms. The patient is safe for discharge and has been instructed to return immediately for worsening symptoms, change in symptoms or any other concerns.  Final diagnoses:  Pyelonephritis    ED Discharge Orders          Ordered    cefpodoxime (VANTIN) 200 MG tablet  2 times daily        01/31/24 2223    ondansetron  (ZOFRAN ) 4 MG tablet  Every 6 hours         01/31/24 2223    naproxen (NAPROSYN) 500 MG tablet  2 times daily        01/31/24 2223    oxyCODONE -acetaminophen  (PERCOCET/ROXICET) 5-325 MG tablet  Every 6 hours PRN        01/31/24 2223               Beola Terrall RAMAN, PA-C 01/31/24 2329    Beola Terrall RAMAN, [place] 01/31/24 2353    Cottie Donnice PARAS, MD 02/04/24 [phone]

## 2024-01-31 NOTE — Discharge Instructions (Addendum)
 You are seen today for pyelonephritis.  I am sending you home with antibiotics, anti-inflammatories, nausea medication and pain control.  If you begin to have any new or worsening symptoms which include uncontrolled fever, uncontrollable pain, persistent vomiting, shortness of breath, lethargy, chest pain, please return to the ED for further evaluation as you will need to be evaluated that time and likely need IV antibiotics.  In the interim, present you home with antibiotics which you can go and pick up, taking them twice a day for next 14 days.  You will need to follow-up with your primary care within the next week to evaluate for your kidney function to ensure that they are improving.  Please take Naprosyn, 500mg  by mouth twice daily as needed for pain - this in an antiinflammatory medicine (NSAID) and is similar to ibuprofen  - many people feel that it is stronger than ibuprofen  and it is easier to take since it is a smaller pill.  Please use this only for 1 week - if your pain persists, you will need to follow up with your doctor in the office for ongoing guidance and pain control.

## 2024-01-31 NOTE — ED Triage Notes (Signed)
 Pt c/o 2 days of right flank/RUQ abdominal pain with nausea. Denies urinary symptoms. Pain rated 9/10 constant.

## 2024-01-31 NOTE — ED Notes (Signed)
Urine obtained in triage

## 2024-02-02 LAB — URINE CULTURE: Culture: >=100000 — AB

## 2024-02-03 ENCOUNTER — Telehealth (HOSPITAL_BASED_OUTPATIENT_CLINIC_OR_DEPARTMENT_OTHER): Payer: Self-pay | Admitting: *Deleted

## 2024-02-03 NOTE — Telephone Encounter (Signed)
 Post ED Visit - Positive Culture Follow-up: Unsuccessful Patient Follow-up  Culture assessed and recommendations reviewed by:  []  Rankin Dee, Pharm.D. [x]  Venetia Gully, Pharm.D., BCPS AQ-ID []  Garrel Crews, Pharm.D., BCPS []  Almarie Lunger, Pharm.D., BCPS []  [place], [place].D., BCPS, AAHIVP []  Rosaline Bihari, Pharm.D., BCPS, AAHIVP []  Massie Rigg, PharmD []  Jodie Rower, PharmD, BCPS  Positive urine culture  []  Patient discharged without antimicrobial prescription and treatment is now indicated [x]  Organism is resistant to prescribed ED discharge antimicrobial []  Patient with positive blood cultures  Plan per Dr. Rolan Quale, EDP: Stop Bactrim; Start Levofloxacin 750mg  po daily x 5 days. Unable to contact patient after 3 attempts, letter will be sent to address on file  Lorita Barnie Pereyra 02/03/2024, 12:53 PM

## 2024-02-03 NOTE — Progress Notes (Signed)
 ED Antimicrobial Stewardship Positive Culture Follow Up   Veronica Calhoun is an 38 y.o. female who presented to [facility] on 01/31/2024 with a chief complaint of  Chief Complaint  Patient presents with   Flank Pain    Recent Results (from the past 720 hours)  Urine Culture     Status: Abnormal   Collection Time: 01/31/24  5:37 PM   Specimen: Urine, Clean Catch  Result Value Ref Range Status   Specimen Description   Final    URINE, CLEAN CATCH Performed at [facility], [address] [place], [place] [address]    Special Requests   Final    NONE Performed at [facility], [address] [place], [place] [address]    Culture >=100,000 COLONIES/mL KLEBSIELLA PNEUMONIAE (A)  Final   Report Status 02/02/2024 FINAL  Final   Organism ID, Bacteria KLEBSIELLA PNEUMONIAE (A)  Final      Susceptibility   Klebsiella pneumoniae - MIC*    AMPICILLIN >=32 RESISTANT Resistant     CEFAZOLIN  (URINE) Value in next row Resistant      >=32 RESISTANTThis is a modified FDA-approved test that has been validated and its performance characteristics determined by the reporting laboratory.  This laboratory is certified under the Clinical Laboratory Improvement Amendments CLIA as qualified to perform high complexity clinical laboratory testing.    CEFEPIME  Value in next row Sensitive      >=32 RESISTANTThis is a modified FDA-approved test that has been validated and its performance characteristics determined by the reporting laboratory.  This laboratory is certified under the Clinical Laboratory Improvement Amendments CLIA as qualified to perform high complexity clinical laboratory testing.    ERTAPENEM Value in next row Sensitive      >=32 RESISTANTThis is a modified FDA-approved test that has been validated and its performance characteristics determined by the reporting laboratory.  This laboratory is certified under the Clinical Laboratory Improvement Amendments  CLIA as qualified to perform high complexity clinical laboratory testing.    CEFTRIAXONE  Value in next row Sensitive      >=32 RESISTANTThis is a modified FDA-approved test that has been validated and its performance characteristics determined by the reporting laboratory.  This laboratory is certified under the Clinical Laboratory Improvement Amendments CLIA as qualified to perform high complexity clinical laboratory testing.    CIPROFLOXACIN Value in next row Sensitive      >=32 RESISTANTThis is a modified FDA-approved test that has been validated and its performance characteristics determined by the reporting laboratory.  This laboratory is certified under the Clinical Laboratory Improvement Amendments CLIA as qualified to perform high complexity clinical laboratory testing.    GENTAMICIN Value in next row Sensitive      >=32 RESISTANTThis is a modified FDA-approved test that has been validated and its performance characteristics determined by the reporting laboratory.  This laboratory is certified under the Clinical Laboratory Improvement Amendments CLIA as qualified to perform high complexity clinical laboratory testing.    NITROFURANTOIN Value in next row Intermediate      >=32 RESISTANTThis is a modified FDA-approved test that has been validated and its performance characteristics determined by the reporting laboratory.  This laboratory is certified under the Clinical Laboratory Improvement Amendments CLIA as qualified to perform high complexity clinical laboratory testing.    TRIMETH/SULFA Value in next row Resistant      >=32 RESISTANTThis is a modified FDA-approved test that has been validated and its performance characteristics determined by the reporting laboratory.  This  laboratory is certified under the Clinical Laboratory Improvement Amendments CLIA as qualified to perform high complexity clinical laboratory testing.    AMPICILLIN/SULBACTAM Value in next row Resistant      >=32  RESISTANTThis is a modified FDA-approved test that has been validated and its performance characteristics determined by the reporting laboratory.  This laboratory is certified under the Clinical Laboratory Improvement Amendments CLIA as qualified to perform high complexity clinical laboratory testing.    PIP/TAZO Value in next row Intermediate      64 INTERMEDIATEThis is a modified FDA-approved test that has been validated and its performance characteristics determined by the reporting laboratory.  This laboratory is certified under the Clinical Laboratory Improvement Amendments CLIA as qualified to perform high complexity clinical laboratory testing.    MEROPENEM Value in next row Sensitive      64 INTERMEDIATEThis is a modified FDA-approved test that has been validated and its performance characteristics determined by the reporting laboratory.  This laboratory is certified under the Clinical Laboratory Improvement Amendments CLIA as qualified to perform high complexity clinical laboratory testing.    * >=100,000 COLONIES/mL KLEBSIELLA PNEUMONIAE    [x]  Treated with Bactrim, organism resistant to prescribed antimicrobial   New antibiotic prescription: Levofloxacin 750mg  po daily x 5 days  ED Provider: Rolan Quale, MD   Celestia Venetia Rush 02/03/2024, 9:32 AM Clinical Pharmacist Monday - Friday phone -  [phone] Saturday - Sunday phone - [phone]

## 2024-02-17 ENCOUNTER — Telehealth: Payer: Self-pay

## 2024-02-17 ENCOUNTER — Encounter: Payer: Self-pay | Admitting: Family Medicine

## 2024-02-17 NOTE — Telephone Encounter (Signed)
 Copied from CRM [id number]. Topic: Clinical - Medication Question >> Feb 17, 2024  2:36 PM Everette C wrote: Reason for CRM: The patient has called to discuss with a member of staff if they'll need an increase in their prescription for semaglutide -weight management (WEGOVY ) 1 MG/0.5ML SOAJ SQ injection prior to requesting a refill of the medication. Please contact further when possible

## 2024-02-18 ENCOUNTER — Other Ambulatory Visit: Payer: Self-pay | Admitting: Family Medicine

## 2024-02-18 ENCOUNTER — Telehealth: Payer: Self-pay | Admitting: Family Medicine

## 2024-02-18 DIAGNOSIS — E66813 Obesity, class 3: Secondary | ICD-10-CM

## 2024-02-18 MED ORDER — WEGOVY 1.7 MG/0.75ML ~~LOC~~ SOAJ: 1.7000 mg | SUBCUTANEOUS | 0 refills | Status: DC

## 2024-02-18 NOTE — Telephone Encounter (Signed)
 Copied from CRM [id number]. Topic: Clinical - Medication Question >> Feb 17, 2024  2:36 PM Everette C wrote: Reason for CRM: The patient has called to discuss with a member of staff if they'll need an increase in their prescription for semaglutide -weight management (WEGOVY ) 1 MG/0.5ML SOAJ SQ injection prior to requesting a refill of the medication. Please contact further when possible >> Feb 18, 2024 12:17 PM Avram MATSU wrote: Patient is due for her shot today and was wondering when will the rx be sent out

## 2024-02-18 NOTE — Telephone Encounter (Signed)
 Rx sent.

## 2024-02-18 NOTE — Telephone Encounter (Signed)
 Duplicate encounter

## 2024-02-22 ENCOUNTER — Other Ambulatory Visit (HOSPITAL_COMMUNITY): Payer: Self-pay

## 2024-02-22 ENCOUNTER — Telehealth: Payer: Self-pay | Admitting: Pharmacy Technician

## 2024-02-22 NOTE — Telephone Encounter (Signed)
 Pharmacy Patient Advocate Encounter   Received notification from CoverMyMeds that prior authorization for Wegovy  1.7MG /0.75ML auto-injectors is required/requested.   Insurance verification completed.   The patient is insured through [employer].   Per test claim: Effective October 1st, Medicaid will discontinue coverage of GLP1 medications for weight loss (such as Wegovy  and Zepbound), unless the patient has a documented history of a heart attack or stroke and is over the age of 57 . Zepbound will continue to be covered only for patients with moderate to severe sleep apnea (AHI 15-30) and a BMI greater than 40.

## 2024-02-23 ENCOUNTER — Encounter: Payer: Self-pay | Admitting: Family Medicine

## 2024-02-29 NOTE — Telephone Encounter (Signed)
 Scheduled 11/14 at 11 am with [facility]

## 2024-03-15 ENCOUNTER — Other Ambulatory Visit: Payer: Self-pay | Admitting: Family Medicine

## 2024-03-15 DIAGNOSIS — I10 Essential (primary) hypertension: Secondary | ICD-10-CM

## 2024-03-16 ENCOUNTER — Encounter: Payer: Self-pay | Admitting: Family Medicine

## 2024-03-16 LAB — CMP14+EGFR
ALT: 15 IU/L (ref 0–32)
AST: 17 IU/L (ref 0–40)
Albumin: 4.2 g/dL (ref 3.9–4.9)
Alkaline Phosphatase: 109 IU/L (ref 41–116)
BUN/Creatinine Ratio: 16 (ref 9–23)
BUN: 16 mg/dL (ref 6–20)
Bilirubin Total: 0.4 mg/dL (ref 0.0–1.2)
CO2: 22 mmol/L (ref 20–29)
Calcium: 9.4 mg/dL (ref 8.7–10.2)
Chloride: 101 mmol/L (ref 96–106)
Creatinine, Ser: 0.98 mg/dL (ref 0.57–1.00)
Globulin, Total: 3.2 g/dL (ref 1.5–4.5)
Glucose: 76 mg/dL (ref 70–99)
Potassium: 4.4 mmol/L (ref 3.5–5.2)
Sodium: 137 mmol/L (ref 134–144)
Total Protein: 7.4 g/dL (ref 6.0–8.5)
eGFR: 77 mL/min/1.73 (ref >59)

## 2024-03-16 LAB — TSH+FREE T4
Free T4: 1.05 ng/dL (ref 0.82–1.77)
TSH: 0.708 u[IU]/mL (ref 0.450–4.500)

## 2024-03-18 ENCOUNTER — Ambulatory Visit: Payer: MEDICAID | Admitting: Family Medicine

## 2024-05-11 ENCOUNTER — Encounter: Payer: Self-pay | Admitting: Family Medicine

## 2024-05-12 ENCOUNTER — Other Ambulatory Visit: Payer: Self-pay | Admitting: Family Medicine

## 2024-05-13 ENCOUNTER — Other Ambulatory Visit: Payer: Self-pay | Admitting: Family Medicine

## 2024-05-14 ENCOUNTER — Other Ambulatory Visit: Payer: Self-pay | Admitting: Family Medicine

## 2024-05-14 DIAGNOSIS — G479 Sleep disorder, unspecified: Secondary | ICD-10-CM

## 2024-05-14 MED ORDER — TRAZODONE HCL 50 MG PO TABS: 50.0000 mg | ORAL_TABLET | Freq: Every evening | ORAL | 3 refills | Status: AC | PRN

## 2024-05-14 NOTE — Telephone Encounter (Signed)
 Rx sent

## 2024-05-16 NOTE — Telephone Encounter (Signed)
 Can you please verify the patients preferred pharmacy?

## 2024-05-16 NOTE — Telephone Encounter (Signed)
 A prescription for naltrexone  50 mg daily to support alcohol cessation was sent to her pharmacy on 05/13/2024. Kindly contact the pharmacy to confirm receipt of the medication, and if received, please notify the patient

## 2024-05-17 ENCOUNTER — Other Ambulatory Visit: Payer: Self-pay

## 2024-05-17 ENCOUNTER — Encounter (HOSPITAL_BASED_OUTPATIENT_CLINIC_OR_DEPARTMENT_OTHER): Payer: Self-pay | Admitting: Orthopaedic Surgery

## 2024-05-18 ENCOUNTER — Other Ambulatory Visit: Payer: Self-pay | Admitting: Family Medicine

## 2024-05-18 DIAGNOSIS — F1020 Alcohol dependence, uncomplicated: Secondary | ICD-10-CM

## 2024-05-18 MED ORDER — NALTREXONE HCL 50 MG PO TABS: 50.0000 mg | ORAL_TABLET | Freq: Every day | ORAL | 0 refills | Status: DC

## 2024-05-18 NOTE — Telephone Encounter (Signed)
 Rx sent

## 2024-05-19 ENCOUNTER — Emergency Department (HOSPITAL_COMMUNITY)
Admission: EM | Admit: 2024-05-19 | Discharge: 2024-05-19 | Disposition: A | Discharge status: Home / Self Care | Payer: MEDICAID | Source: Home / Self Care | Attending: Emergency Medicine | Admitting: Emergency Medicine

## 2024-05-19 ENCOUNTER — Encounter (HOSPITAL_COMMUNITY): Payer: Self-pay

## 2024-05-19 ENCOUNTER — Emergency Department (HOSPITAL_COMMUNITY)
Admission: EM | Admit: 2024-05-19 | Discharge: 2024-05-19 | Disposition: A | Discharge status: Home / Self Care | Payer: MEDICAID | Attending: Emergency Medicine | Admitting: Emergency Medicine

## 2024-05-19 ENCOUNTER — Other Ambulatory Visit: Payer: Self-pay

## 2024-05-19 DIAGNOSIS — Z794 Long term (current) use of insulin: Secondary | ICD-10-CM | POA: Diagnosis not present

## 2024-05-19 DIAGNOSIS — K2920 Alcoholic gastritis without bleeding: Secondary | ICD-10-CM | POA: Insufficient documentation

## 2024-05-19 DIAGNOSIS — R1013 Epigastric pain: Secondary | ICD-10-CM | POA: Insufficient documentation

## 2024-05-19 DIAGNOSIS — Z7982 Long term (current) use of aspirin: Secondary | ICD-10-CM | POA: Insufficient documentation

## 2024-05-19 DIAGNOSIS — R109 Unspecified abdominal pain: Secondary | ICD-10-CM | POA: Diagnosis present

## 2024-05-19 DIAGNOSIS — R110 Nausea: Secondary | ICD-10-CM | POA: Diagnosis not present

## 2024-05-19 DIAGNOSIS — Z79899 Other long term (current) drug therapy: Secondary | ICD-10-CM | POA: Insufficient documentation

## 2024-05-19 DIAGNOSIS — I10 Essential (primary) hypertension: Secondary | ICD-10-CM | POA: Insufficient documentation

## 2024-05-19 LAB — COMPREHENSIVE METABOLIC PANEL WITH GFR
ALT: 39 U/L (ref 0–44)
ALT: 41 U/L (ref 0–44)
AST: 54 U/L — ABNORMAL HIGH (ref 15–41)
AST: 66 U/L — ABNORMAL HIGH (ref 15–41)
Albumin: 4.2 g/dL (ref 3.5–5.0)
Albumin: 4.2 g/dL (ref 3.5–5.0)
Alkaline Phosphatase: 106 U/L (ref 38–126)
Alkaline Phosphatase: 108 U/L (ref 38–126)
Anion gap: 14 (ref 5–15)
Anion gap: 15 (ref 5–15)
BUN: 7 mg/dL (ref 6–20)
BUN: 9 mg/dL (ref 6–20)
CO2: 23 mmol/L (ref 22–32)
CO2: 21 mmol/L — ABNORMAL LOW (ref 22–32)
Calcium: 9 mg/dL (ref 8.9–10.3)
Calcium: 9.2 mg/dL (ref 8.9–10.3)
Chloride: 97 mmol/L — ABNORMAL LOW (ref 98–111)
Chloride: 98 mmol/L (ref 98–111)
Creatinine, Ser: 0.73 mg/dL (ref 0.44–1.00)
Creatinine, Ser: 0.77 mg/dL (ref 0.44–1.00)
GFR, Estimated: >60 mL/min
GFR, Estimated: >60 mL/min
Glucose, Bld: 103 mg/dL — ABNORMAL HIGH (ref 70–99)
Glucose, Bld: 99 mg/dL (ref 70–99)
Potassium: 3.3 mmol/L — ABNORMAL LOW (ref 3.5–5.1)
Potassium: 3.7 mmol/L (ref 3.5–5.1)
Sodium: 134 mmol/L — ABNORMAL LOW (ref 135–145)
Sodium: 134 mmol/L — ABNORMAL LOW (ref 135–145)
Total Bilirubin: 1 mg/dL (ref 0.0–1.2)
Total Bilirubin: 1.3 mg/dL — ABNORMAL HIGH (ref 0.0–1.2)
Total Protein: 7.8 g/dL (ref 6.5–8.1)
Total Protein: 7.9 g/dL (ref 6.5–8.1)

## 2024-05-19 LAB — PREGNANCY, URINE: Preg Test, Ur: NEGATIVE

## 2024-05-19 LAB — CBC WITH DIFFERENTIAL/PLATELET
Abs Immature Granulocytes: 0.02 K/uL (ref 0.00–0.07)
Abs Immature Granulocytes: 0.04 K/uL (ref 0.00–0.07)
Basophils Absolute: 0.1 K/uL (ref 0.0–0.1)
Basophils Absolute: 0.1 K/uL (ref 0.0–0.1)
Basophils Relative: 1 %
Basophils Relative: 1 %
Eosinophils Absolute: 0.1 K/uL (ref 0.0–0.5)
Eosinophils Absolute: 0.1 K/uL (ref 0.0–0.5)
Eosinophils Relative: 1 %
Eosinophils Relative: 1 %
HCT: 41.4 % (ref 36.0–46.0)
HCT: 40.2 % (ref 36.0–46.0)
Hemoglobin: 14.9 g/dL (ref 12.0–15.0)
Hemoglobin: 14.4 g/dL (ref 12.0–15.0)
Immature Granulocytes: 0 %
Immature Granulocytes: 1 %
Lymphocytes Relative: 24 %
Lymphocytes Relative: 17 %
Lymphs Abs: 2.2 K/uL (ref 0.7–4.0)
Lymphs Abs: 1.5 K/uL (ref 0.7–4.0)
MCH: 33.6 pg (ref 26.0–34.0)
MCH: 34.1 pg — ABNORMAL HIGH (ref 26.0–34.0)
MCHC: 36 g/dL (ref 30.0–36.0)
MCHC: 35.8 g/dL (ref 30.0–36.0)
MCV: 93.2 fL (ref 80.0–100.0)
MCV: 95.3 fL (ref 80.0–100.0)
Monocytes Absolute: 0.7 K/uL (ref 0.1–1.0)
Monocytes Absolute: 0.7 K/uL (ref 0.1–1.0)
Monocytes Relative: 8 %
Monocytes Relative: 8 %
Neutro Abs: 5.9 K/uL (ref 1.7–7.7)
Neutro Abs: 6.4 K/uL (ref 1.7–7.7)
Neutrophils Relative %: 66 %
Neutrophils Relative %: 72 %
Platelets: 317 K/uL (ref 150–400)
Platelets: 302 K/uL (ref 150–400)
RBC: 4.44 MIL/uL (ref 3.87–5.11)
RBC: 4.22 MIL/uL (ref 3.87–5.11)
RDW: 14.1 % (ref 11.5–15.5)
RDW: 14.4 % (ref 11.5–15.5)
WBC: 9 K/uL (ref 4.0–10.5)
WBC: 8.8 K/uL (ref 4.0–10.5)
nRBC: 0 % (ref 0.0–0.2)
nRBC: 0 % (ref 0.0–0.2)

## 2024-05-19 LAB — URINALYSIS, ROUTINE W REFLEX MICROSCOPIC
Bilirubin Urine: NEGATIVE
Glucose, UA: NEGATIVE mg/dL
Ketones, ur: NEGATIVE mg/dL
Nitrite: NEGATIVE
Protein, ur: NEGATIVE mg/dL
Specific Gravity, Urine: 1.005 (ref 1.005–1.030)
pH: 6 (ref 5.0–8.0)

## 2024-05-19 LAB — LIPASE, BLOOD
Lipase: 30 U/L (ref 11–51)
Lipase: 31 U/L (ref 11–51)

## 2024-05-19 MED ORDER — ONDANSETRON HCL 4 MG/2ML IJ SOLN
4.0000 mg | Freq: Once | INTRAMUSCULAR | Status: AC
  Administered 2024-05-19: 4 mg via INTRAVENOUS
  Filled 2024-05-19: qty 2

## 2024-05-19 MED ORDER — OXYCODONE-ACETAMINOPHEN 5-325 MG PO TABS
1.0000 | ORAL_TABLET | Freq: Once | ORAL | Status: AC
  Administered 2024-05-19: 1 via ORAL
  Filled 2024-05-19: qty 1

## 2024-05-19 MED ORDER — FENTANYL CITRATE (PF) 100 MCG/2ML IJ SOLN
50.0000 ug | Freq: Once | INTRAMUSCULAR | Status: AC
  Administered 2024-05-19: 50 ug via INTRAVENOUS
  Filled 2024-05-19: qty 2

## 2024-05-19 MED ORDER — SODIUM CHLORIDE 0.9 % IV BOLUS
1000.0000 mL | Freq: Once | INTRAVENOUS | Status: AC
  Administered 2024-05-19: 1000 mL via INTRAVENOUS

## 2024-05-19 MED ORDER — SUCRALFATE 1 GM/10ML PO SUSP: 1.0000 g | Freq: Three times a day (TID) | ORAL | Status: DC

## 2024-05-19 MED ORDER — PANTOPRAZOLE SODIUM 40 MG PO TBEC
40.0000 mg | DELAYED_RELEASE_TABLET | Freq: Every day | ORAL | Status: DC
  Administered 2024-05-19: 40 mg via ORAL
  Filled 2024-05-19: qty 1

## 2024-05-19 MED ORDER — SUCRALFATE 1 G PO TABS: 1.0000 g | ORAL_TABLET | Freq: Three times a day (TID) | ORAL | 0 refills | Status: DC

## 2024-05-19 MED ORDER — OMEPRAZOLE 20 MG PO CPDR: 20.0000 mg | DELAYED_RELEASE_CAPSULE | Freq: Every day | ORAL | 0 refills | Status: DC

## 2024-05-19 MED ORDER — MORPHINE SULFATE (PF) 4 MG/ML IV SOLN
4.0000 mg | Freq: Once | INTRAVENOUS | Status: AC
  Administered 2024-05-19: 4 mg via INTRAVENOUS
  Filled 2024-05-19: qty 1

## 2024-05-19 NOTE — ED Notes (Signed)
 Pt endorsees having 6 shots and a mike's hard lemonade prior to arrival.

## 2024-05-19 NOTE — ED Notes (Addendum)
 Pt has been drinking water and tolerating well

## 2024-05-19 NOTE — ED Notes (Signed)
Pt given ginger ale per request

## 2024-05-19 NOTE — ED Notes (Signed)
 Patient confirmed she has a ride to go home. D/C patient at this time. No complaints or concern.

## 2024-05-19 NOTE — ED Notes (Signed)
 Pt drinking water provided by sig other at bedside. Pt tolerating well- denies nausea

## 2024-05-19 NOTE — ED Notes (Signed)
 pt asked if we could give something for her shaking a little bit CIWA is a 1 Dr Bari made aware of pt request

## 2024-05-19 NOTE — Discharge Instructions (Signed)
 Take the medicines that you are prescribed earlier.  The Prilosec/omeprazole  can be taken over-the-counter if they do not have the prescription form.

## 2024-05-19 NOTE — ED Provider Notes (Signed)
 " [place] [facility] Provider Note   CSN: [id number] Arrival date & time: 05/19/24  9952     Patient presents with: No chief complaint on file.   Veronica Calhoun is a 39 y.o. female.   HPI    This is a 39 year old female who presents with abdominal pain.  Patient reports 24-hour history of worsening abdominal pain.  She thinks it may be related to her pancreas.  Has a known history of alcohol induced pancreatitis.  Reports that she had been abstinent from alcohol for about 18 months but relapsed several months ago.  Over the last 24 hours she has had at least 6 shots and a mikes hard lemonade.  Reports nausea without vomiting.  Normal bowel movements.  No fevers. Prior to Admission medications  Medication Sig Start Date End Date Taking? Authorizing Provider  omeprazole  (PRILOSEC) 20 MG capsule Take 1 capsule (20 mg total) by mouth daily. 05/19/24  Yes Gaye Scorza, Charmaine FALCON, MD  sucralfate  (CARAFATE ) 1 g tablet Take 1 tablet (1 g total) by mouth 4 (four) times daily -  with meals and at bedtime. 05/19/24  Yes Yazleen Molock, Charmaine FALCON, MD  acetaminophen  (TYLENOL ) 500 MG tablet Take 500 mg by mouth every 6 (six) hours as needed.    [provider]  aspirin  EC 81 MG tablet Take 81 mg by mouth daily. 12/04/23   [provider]  Aspirin -Salicylamide-Caffeine (BC HEADACHE) 325-95-16 MG TABS Take 1 Package by mouth daily as needed (for pain).    [provider]  Blood Pressure Monitoring (BLOOD PRESSURE KIT) DEVI Take blood pressure reading 2 times daily. 12/23/22   Del Orbe Polanco, Hilario, FNP  cloNIDine  (CATAPRES ) 0.2 MG tablet TAKE 1 TABLET(0.2 MG) BY MOUTH THREE TIMES DAILY 10/28/23   Del Wilhelmena Falter, Hilario, FNP  hydrOXYzine  (VISTARIL ) 25 MG capsule TAKE 1 CAPSULE(25 MG) BY MOUTH AT BEDTIME 07/27/23   Del Orbe Polanco, Iliana, FNP  metoprolol  succinate (TOPROL -XL) 50 MG 24 hr tablet TAKE 1 TABLET(50 MG) BY MOUTH DAILY WITH OR IMMEDIATELY  FOLLOWING A MEAL 03/15/24   Del Wilhelmena Falter, Hilario, FNP  naltrexone  (DEPADE) 50 MG tablet Take 1 tablet (50 mg total) by mouth daily. 05/18/24   Bacchus, Meade PEDLAR, FNP  naproxen  (NAPROSYN ) 500 MG tablet Take 1 tablet (500 mg total) by mouth 2 (two) times daily. 01/31/24   Bauer, Collin S, PA-C  olmesartan -hydrochlorothiazide  (BENICAR  HCT) 40-12.5 MG tablet TAKE 1 TABLET BY MOUTH DAILY 12/10/23   Del Orbe Polanco, Hilario, FNP  ondansetron  (ZOFRAN ) 4 MG tablet Take 1 tablet (4 mg total) by mouth every 6 (six) hours. 01/31/24   Beola Terrall RAMAN, PA-C  rosuvastatin  (CRESTOR ) 5 MG tablet TAKE 1 TABLET(5 MG) BY MOUTH DAILY 12/03/23   Del Wilhelmena Falter, Hilario, FNP  semaglutide -weight management (WEGOVY ) 1.7 MG/0.75ML SOAJ SQ injection Inject 1.7 mg into the skin once a week. Patient not taking: Reported on 05/17/2024 02/18/24   Edman Meade PEDLAR, FNP  traZODone  (DESYREL ) 50 MG tablet Take 1 tablet (50 mg total) by mouth at bedtime as needed for sleep. 05/14/24   Bacchus, Gloria Z, FNP    Allergies: Lisinopril and Amlodipine    Review of Systems  Constitutional:  Negative for fever.  Respiratory:  Negative for shortness of breath.   Cardiovascular:  Negative for chest pain.  Gastrointestinal:  Positive for abdominal pain and nausea. Negative for vomiting.  All other systems reviewed and are negative.   Updated Vital Signs BP  105/69   Pulse 89   Temp 98.2 F (36.8 C) (Oral)   Resp 16   LMP 05/17/2024 (Exact Date)   SpO2 97%   Physical Exam Vitals and nursing note reviewed.  Constitutional:      Appearance: She is well-developed. She is not ill-appearing.  HENT:     Head: Normocephalic and atraumatic.  Eyes:     Pupils: Pupils are equal, round, and reactive to light.  Cardiovascular:     Rate and Rhythm: Normal rate and regular rhythm.     Heart sounds: Normal heart sounds.  Pulmonary:     Effort: Pulmonary effort is normal. No respiratory distress.     Breath sounds: No wheezing.   Abdominal:     General: Bowel sounds are normal.     Palpations: Abdomen is soft.     Tenderness: There is abdominal tenderness.     Comments: Epigastric tenderness to palpation without rebound or guarding  Musculoskeletal:     Cervical back: Neck supple.  Skin:    General: Skin is warm and dry.  Neurological:     Mental Status: She is alert and oriented to person, place, and time.  Psychiatric:        Mood and Affect: Mood normal.     (all labs ordered are listed, but only abnormal results are displayed) Labs Reviewed  URINE CULTURE - Abnormal; Notable for the following components:      Result Value   Culture   (*)    Value: >=100,000 COLONIES/mL KLEBSIELLA PNEUMONIAE SUSCEPTIBILITIES TO FOLLOW Performed at [facility], [address] [place], [place] [address]    All other components within normal limits  COMPREHENSIVE METABOLIC PANEL WITH GFR - Abnormal; Notable for the following components:   Sodium 134 (*)    Potassium 3.3 (*)    Chloride 97 (*)    Glucose, Bld 103 (*)    AST 54 (*)    All other components within normal limits  URINALYSIS, ROUTINE W REFLEX MICROSCOPIC - Abnormal; Notable for the following components:   APPearance HAZY (*)    Hgb urine dipstick LARGE (*)    Leukocytes,Ua MODERATE (*)    Bacteria, UA FEW (*)    All other components within normal limits  CBC WITH DIFFERENTIAL/PLATELET  LIPASE, BLOOD  PREGNANCY, URINE    EKG: None  Radiology: No results found.   Procedures   Medications Ordered in the ED  morphine  (PF) 4 MG/ML injection 4 mg (4 mg Intravenous Given 05/19/24 0124)  ondansetron  (ZOFRAN ) injection 4 mg (4 mg Intravenous Given 05/19/24 0124)  sodium chloride  0.9 % bolus 1,000 mL (0 mLs Intravenous Stopped 05/19/24 0321)                                    Medical Decision Making Amount and/or Complexity of Data Reviewed Labs: ordered.  Risk Prescription drug management.   This patient presents to the ED for  concern of abdominal pain, this involves an extensive number of treatment options, and is a complaint that carries with it a high risk of complications and morbidity.  I considered the following differential and admission for this acute, potentially life threatening condition.  The differential diagnosis includes gastritis, gastroenteritis, pancreatitis, cholecystitis peptic ulcer  MDM:    This is a 38 year old female who presents with abdominal pain.  Reports that is consistent with her pancreatitis.  Has relapsed into  drinking recently.  She is nontoxic and vital signs are notable for mild tachycardia.  Patient given fluids, pain, nausea medication.  Labs obtained.  Labs without any significant abnormalities including normal lipase.  Suspect gastritis or peptic ulcer.  Discussed this with the patient.  This can be related to alcohol use.  Recommend Carafate  and omeprazole .  (Labs, imaging, consults)  Labs: I Ordered, and personally interpreted labs.  The pertinent results include: CBC, CMP, lipase  Imaging Studies ordered: I ordered imaging studies including none I independently visualized and interpreted imaging. I agree with the radiologist interpretation  Additional history obtained from chart review.  External records from outside source obtained and reviewed including prior evaluations  Cardiac Monitoring: The patient was maintained on a cardiac monitor.  If on the cardiac monitor, I personally viewed and interpreted the cardiac monitored which showed an underlying rhythm of: Sinus  Reevaluation: After the interventions noted above, I reevaluated the patient and found that they have :improved  Social Determinants of Health:  lives independently  Disposition: Discharge  Co morbidities that complicate the patient evaluation  Past Medical History:  Diagnosis Date   Acute pancreatitis 12/29/2021   Alcohol abuse 06/24/2022   Alcohol induced acute pancreatitis 06/10/2022    Alcohol use disorder, severe, dependence (HCC) 05/20/2018   Alcoholic hepatitis without ascites (HCC) 06/19/2022   Anasarca 06/20/2022   Anxiety 2013   Generalized anxiety disorder 04/01/2013   History of hypertension 11/15/2021   Hypertension 2014   Pancreatitis    Warts, 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    Dispense:  90 tablet    Refill:  0   omeprazole  (PRILOSEC) 20 MG capsule    Sig: Take 1 capsule (20 mg total) by mouth daily.    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

## 2024-05-19 NOTE — ED Triage Notes (Signed)
 Pov from home. Cc of abdominal pain. Thinks it is her pancreas. Says that she was sober appx a year but relapsed recently. Had 6 shots and a mikes hard lemonade tonight

## 2024-05-19 NOTE — ED Provider Notes (Signed)
 " Veronica Calhoun is a 39 y.o. female.    Abdominal Pain Patient presents with abdominal pain.  History of pancreatitis.  History of alcohol use.  Seen last night for the same.  Workup reassuring at that time.  Did not have the Carafate  or omeprazole  filled.  Reportedly did not have it at the pharmacy.  Continued pain.  Had some nausea.  Has been drinking alcohol again.     Prior to Admission medications  Medication Sig Start Date End Date Taking? Authorizing Provider  acetaminophen  (TYLENOL ) 500 MG tablet Take 500 mg by mouth every 6 (six) hours as needed.    [provider]  aspirin EC 81 MG tablet Take 81 mg by mouth daily. 12/04/23   [provider]  Aspirin-Salicylamide-Caffeine (BC HEADACHE) 325-95-16 MG TABS Take 1 Package by mouth daily as needed (for pain).    [provider]  Blood Pressure Monitoring (BLOOD PRESSURE KIT) DEVI Take blood pressure reading 2 times daily. 12/23/22   Del Wilhelmena Falter, Hilario, FNP  cloNIDine  (CATAPRES ) 0.2 MG tablet TAKE 1 TABLET(0.2 MG) BY MOUTH THREE TIMES DAILY 10/28/23   Del Wilhelmena Falter, Hilario, FNP  hydrOXYzine  (VISTARIL ) 25 MG capsule TAKE 1 CAPSULE(25 MG) BY MOUTH AT BEDTIME 07/27/23   Del Orbe Polanco, Iliana, FNP  metoprolol  succinate (TOPROL -XL) 50 MG 24 hr tablet TAKE 1 TABLET(50 MG) BY MOUTH DAILY WITH OR IMMEDIATELY FOLLOWING A MEAL 03/15/24   Del Wilhelmena Falter, Hilario, FNP  naltrexone  (DEPADE) 50 MG tablet Take 1 tablet (50 mg total) by mouth daily. 05/18/24   Bacchus, Meade PEDLAR, FNP  naproxen  (NAPROSYN ) 500 MG tablet Take 1 tablet (500 mg total) by mouth 2 (two) times daily. 01/31/24   Bauer, Collin S, PA-C  olmesartan -hydrochlorothiazide  (BENICAR  HCT) 40-12.5 MG tablet TAKE 1 TABLET BY MOUTH DAILY 12/10/23   Del Orbe Polanco, Hilario, FNP   omeprazole  (PRILOSEC) 20 MG capsule Take 1 capsule (20 mg total) by mouth daily. 05/19/24   Horton, Charmaine FALCON, MD  ondansetron  (ZOFRAN ) 4 MG tablet Take 1 tablet (4 mg total) by mouth every 6 (six) hours. 01/31/24   Beola Terrall RAMAN, PA-C  rosuvastatin  (CRESTOR ) 5 MG tablet TAKE 1 TABLET(5 MG) BY MOUTH DAILY 12/03/23   Del Orbe Polanco, Hilario, FNP  semaglutide -weight management (WEGOVY ) 1.7 MG/0.75ML SOAJ SQ injection Inject 1.7 mg into the skin once a week. Patient not taking: Reported on 05/17/2024 02/18/24   Edman Meade PEDLAR, FNP  sucralfate  (CARAFATE ) 1 g tablet Take 1 tablet (1 g total) by mouth 4 (four) times daily -  with meals and at bedtime. 05/19/24   Horton, Charmaine FALCON, MD  traZODone  (DESYREL ) 50 MG tablet Take 1 tablet (50 mg total) by mouth at bedtime as needed for sleep. 05/14/24   Bacchus, Gloria Z, FNP    Allergies: Lisinopril and Amlodipine    Review of Systems  Gastrointestinal:  Positive for abdominal pain.    Updated Vital Signs BP (!) 149/103 (BP Location: Right Arm)   Pulse 75   Temp 97.8 F (36.6 C) (Oral)   Resp 20   Wt 104.3 kg   LMP 05/17/2024 (Exact Date)   SpO2 100%   BMI 37.12 kg/m   Physical Exam Vitals and nursing note reviewed.  Cardiovascular:     Rate and Rhythm: Normal rate.  Abdominal:     Tenderness: There is abdominal tenderness.     Comments: Mild upper abdominal tenderness without rebound or guarding.  No hernia palpated.  Neurological:     Mental Status: She is alert.     (all labs ordered are listed, but only abnormal results are displayed) Labs Reviewed  CBC WITH DIFFERENTIAL/PLATELET - Abnormal; Notable for the following components:      Result Value   MCH 34.1 (*)    All other components within normal limits  COMPREHENSIVE METABOLIC PANEL WITH GFR - Abnormal; Notable for the following components:   Sodium 134 (*)    CO2 21 (*)    AST 66 (*)    Total Bilirubin 1.3 (*)    All other components within normal limits  LIPASE,  BLOOD    EKG: None  Radiology: No results found.   Procedures   Medications Ordered in the ED  pantoprazole  (PROTONIX ) EC tablet 40 mg (40 mg Oral Given 05/19/24 2054)  sodium chloride  0.9 % bolus 1,000 mL (0 mLs Intravenous Stopped 05/19/24 2055)  ondansetron  (ZOFRAN ) injection 4 mg (4 mg Intravenous Given 05/19/24 1900)  fentaNYL  (SUBLIMAZE ) injection 50 mcg (50 mcg Intravenous Given 05/19/24 1859)  oxyCODONE -acetaminophen  (PERCOCET/ROXICET) 5-325 MG per tablet 1 tablet (1 tablet Oral Given 05/19/24 2054)                                    Medical Decision Making Amount and/or Complexity of Data Reviewed Labs: ordered.  Risk Prescription drug management.   Patient upper abdominal pain.  Recently seen for same.  Does have a history of alcohol use.  Also previous pancreatitis.  Blood work reassuring.  AST mildly elevated.  Has normal lipase.  Do not think we need CT scan.  Will treat symptomatically.  Discharge home.  Outpatient follow-up.      Final diagnoses:  Epigastric pain    ED Discharge Orders     None          Patsey Lot, MD 05/19/24 2327  "

## 2024-05-19 NOTE — ED Notes (Signed)
Pt ambulatory to bathroom. attempting to provide urine sample.

## 2024-05-19 NOTE — ED Triage Notes (Signed)
 Pt seen last night and diagnosed with alcoholic gastritis.  Pt reports she did not get the Carafate  and omeprazole  that was prescribed and is still having pain and nausea.

## 2024-05-19 NOTE — Discharge Instructions (Signed)
 You are seen today for abdominal pain.  There is no evidence of pancreatitis at this time.  You may have developed some gastritis related to alcohol use.  Try to abstain from alcohol use.  Take Carafate  and omeprazole .

## 2024-05-19 NOTE — ED Notes (Signed)
 ED Provider at bedside.

## 2024-05-21 LAB — URINE CULTURE: Culture: >=100000 — AB

## 2024-05-22 ENCOUNTER — Telehealth (HOSPITAL_BASED_OUTPATIENT_CLINIC_OR_DEPARTMENT_OTHER): Payer: Self-pay | Admitting: *Deleted

## 2024-05-22 NOTE — Progress Notes (Signed)
 ED Antimicrobial Stewardship Positive Culture Follow Up   Veronica Calhoun is an 39 y.o. female who presented to [facility] on @ADMITDT @ with a chief complaint of No chief complaint on file.   Recent Results (from the past 720 hours)  Urine Culture     Status: Abnormal   Collection Time: 05/19/24  2:33 AM   Specimen: Urine, Clean Catch  Result Value Ref Range Status   Specimen Description   Final    URINE, CLEAN CATCH Performed at [facility], [address] [place], [place] [address]    Special Requests   Final    NONE Performed at [facility], [address], [place], [place] [address]    Culture >=100,000 COLONIES/mL KLEBSIELLA PNEUMONIAE (A)  Final   Report Status 05/21/2024 FINAL  Final   Organism ID, Bacteria KLEBSIELLA PNEUMONIAE (A)  Final      Susceptibility   Klebsiella pneumoniae - MIC*    AMPICILLIN >=32 RESISTANT Resistant     CEFAZOLIN  (URINE) Value in next row Sensitive      4 SENSITIVEThis is a modified FDA-approved test that has been validated and its performance characteristics determined by the reporting laboratory.  This laboratory is certified under the Clinical Laboratory Improvement Amendments CLIA as qualified to perform high complexity clinical laboratory testing.    CEFEPIME  Value in next row Sensitive      4 SENSITIVEThis is a modified FDA-approved test that has been validated and its performance characteristics determined by the reporting laboratory.  This laboratory is certified under the Clinical Laboratory Improvement Amendments CLIA as qualified to perform high complexity clinical laboratory testing.    ERTAPENEM Value in next row Sensitive      4 SENSITIVEThis is a modified FDA-approved test that has been validated and its performance characteristics determined by the reporting laboratory.  This laboratory is certified under the Clinical Laboratory Improvement Amendments CLIA as qualified to perform high complexity clinical laboratory testing.     CEFTRIAXONE  Value in next row Sensitive      4 SENSITIVEThis is a modified FDA-approved test that has been validated and its performance characteristics determined by the reporting laboratory.  This laboratory is certified under the Clinical Laboratory Improvement Amendments CLIA as qualified to perform high complexity clinical laboratory testing.    CIPROFLOXACIN Value in next row Sensitive      4 SENSITIVEThis is a modified FDA-approved test that has been validated and its performance characteristics determined by the reporting laboratory.  This laboratory is certified under the Clinical Laboratory Improvement Amendments CLIA as qualified to perform high complexity clinical laboratory testing.    GENTAMICIN Value in next row Sensitive      4 SENSITIVEThis is a modified FDA-approved test that has been validated and its performance characteristics determined by the reporting laboratory.  This laboratory is certified under the Clinical Laboratory Improvement Amendments CLIA as qualified to perform high complexity clinical laboratory testing.    NITROFURANTOIN Value in next row Intermediate      4 SENSITIVEThis is a modified FDA-approved test that has been validated and its performance characteristics determined by the reporting laboratory.  This laboratory is certified under the Clinical Laboratory Improvement Amendments CLIA as qualified to perform high complexity clinical laboratory testing.    TRIMETH /SULFA  Value in next row Resistant      4 SENSITIVEThis is a modified FDA-approved test that has been validated and its performance characteristics determined by the reporting laboratory.  This laboratory is certified under the Clinical Laboratory Improvement Amendments  CLIA as qualified to perform high complexity clinical laboratory testing.    AMPICILLIN/SULBACTAM Value in next row Resistant      4 SENSITIVEThis is a modified FDA-approved test that has been validated and its performance characteristics  determined by the reporting laboratory.  This laboratory is certified under the Clinical Laboratory Improvement Amendments CLIA as qualified to perform high complexity clinical laboratory testing.    PIP/TAZO Value in next row Sensitive      <=4 SENSITIVEThis is a modified FDA-approved test that has been validated and its performance characteristics determined by the reporting laboratory.  This laboratory is certified under the Clinical Laboratory Improvement Amendments CLIA as qualified to perform high complexity clinical laboratory testing.    MEROPENEM Value in next row Sensitive      <=4 SENSITIVEThis is a modified FDA-approved test that has been validated and its performance characteristics determined by the reporting laboratory.  This laboratory is certified under the Clinical Laboratory Improvement Amendments CLIA as qualified to perform high complexity clinical laboratory testing.    * >=100,000 COLONIES/mL KLEBSIELLA PNEUMONIAE    [x]  No specific urinary symptoms noted while in ED. Likely asymptomatic bacteriuria. Please call for symptom check for UTI.  No symptoms >> no abx needed Yes symptoms >> Keflex  500 mg BID x 5 days (Qty 10; Refills 0)  ED Provider: Charlyn Dorn Buttner, PharmD, BCPS 05/22/2024 11:23 AM ED Clinical Pharmacist -  [phone]

## 2024-05-22 NOTE — Telephone Encounter (Signed)
 Post ED Visit - Positive Culture Follow-up: Unsuccessful Patient Follow-up  Culture assessed and recommendations reviewed by:  [x]  Dorn Buttner, Pharm.D. []  Venetia Gully, Pharm.D., BCPS AQ-ID []  Garrel Crews, Pharm.D., BCPS []  Almarie Lunger, Pharm.D., BCPS []  [place], [place].D., BCPS, AAHIVP []  Rosaline Bihari, Pharm.D., BCPS, AAHIVP []  Massie Rigg, PharmD []  Jodie Rower, PharmD, BCPS  Positive urine culture  Plan: Call for symptom check: If no symptoms- No treatment needed If having symptoms- Call in Keflex  500mg  BID x 5 days (qty 10 refills 0) per Dr. Charlyn  Unable to contact patient letter will be sent to address on file  Veronica Calhoun 05/22/2024, 12:20 PM

## 2024-05-23 ENCOUNTER — Ambulatory Visit: Payer: Self-pay

## 2024-05-23 NOTE — Telephone Encounter (Signed)
 FYI Only or Action Required?: Action required by provider: update on patient condition.  Patient was last seen in primary care on 12/23/2023 by Terry Wilhelmena Lloyd Hilario, FNP.  Called Nurse Triage reporting Medication Problem.  Symptoms began today.  Interventions attempted: Nothing.  Symptoms are: stable.  Triage Disposition: Call PCP Now  Patient/caregiver understands and will follow disposition?: Reason for Triage: Accidentally took two olmesartan  pills, no symptoms currently since this just happened 10 minutes ago. However she is very concerned.    Reason for Disposition  [1] DOUBLE DOSE (an extra dose or lesser amount) of prescription drug AND [2] NO symptoms  (Exception: A double dose of antibiotics.)  Answer Assessment - Initial Assessment Questions 1. NAME of MEDICINE: What medicine(s) are you calling about?     Olmesartan   2. QUESTION: What is your question? (e.g., double dose of medicine, side effect)     Took a second dose unintentionally 3. PRESCRIBER: Who prescribed the medicine? Reason: if prescribed by specialist, call should be referred to that group.     PCP 4. SYMPTOMS: Do you have any symptoms? If Yes, ask: What symptoms are you having?  How bad are the symptoms (e.g., mild, moderate, severe)     Denies s/s at this time 5. PREGNANCY:  Is there any chance that you are pregnant? When was your last menstrual period?     Denies  Pt was advised of s/s to watch for, pt was unable to take a BP during the NT. Pt advised to take BP when can and advised to call back if low BP or s/s.  Protocols used: Medication Question Call-A-AH

## 2024-05-24 ENCOUNTER — Encounter (HOSPITAL_BASED_OUTPATIENT_CLINIC_OR_DEPARTMENT_OTHER): Payer: Self-pay | Admitting: Orthopaedic Surgery

## 2024-05-24 NOTE — Anesthesia Preprocedure Evaluation (Signed)
 "                                  Anesthesia Evaluation  Patient identified by MRN, date of birth, ID band Patient awake    Reviewed: Allergy & Precautions, NPO status , Patient's Chart, lab work & pertinent test results  Airway Mallampati: II  TM Distance: >3 FB Neck ROM: Full    Dental no notable dental hx. (+) Teeth Intact, Dental Advisory Given   Pulmonary pneumonia, resolved, Current SmokerPatient did not abstain from smoking.   breath sounds clear to auscultation       Cardiovascular hypertension, Pt. on medications and Pt. on home beta blockers Normal cardiovascular exam Rhythm:Regular Rate:Normal     Neuro/Psych  PSYCHIATRIC DISORDERS Anxiety Depression    negative neurological ROS     GI/Hepatic ,GERD  Medicated,,(+) Hepatitis -, Toxin Related  Endo/Other  Obesity HLD  Renal/GU Lab Results      Component                Value               Date                      NA                       134 (L)             05/19/2024                CL                       98                  05/19/2024                K                        3.7                 05/19/2024                CO2                      21 (L)              05/19/2024                BUN                      9                   05/19/2024                CREATININE               0.77                05/19/2024                GFRNONAA                 >60                 05/19/2024  CALCIUM                   9.2                 05/19/2024                ALBUMIN                  4.2                 05/19/2024                GLUCOSE                  99                  05/19/2024             negative genitourinary   Musculoskeletal Retained hardware right ankle - Lateral   Abdominal  (+) + obese  Peds  Hematology negative hematology ROS (+) Lab Results      Component                Value               Date                      WBC                      8.8                  05/19/2024                HGB                      14.4                05/19/2024                HCT                      40.2                05/19/2024                MCV                      95.3                05/19/2024                PLT                      302                 05/19/2024              Anesthesia Other Findings   Reproductive/Obstetrics negative OB ROS                              Anesthesia Physical Anesthesia Plan  ASA: 2  Anesthesia Plan: General   Post-op Pain Management: Minimal or no pain anticipated   Induction: Intravenous  PONV Risk Score and Plan: 3 and Treatment may vary due to age or medical condition  Airway Management Planned: LMA  Additional Equipment: None  Intra-op Plan:  Post-operative Plan: Extubation in OR  Informed Consent: I have reviewed the patients History and Physical, chart, labs and discussed the procedure including the risks, benefits and alternatives for the proposed anesthesia with the patient or authorized representative who has indicated his/her understanding and acceptance.     Dental advisory given  Plan Discussed with: CRNA and Anesthesiologist  Anesthesia Plan Comments:          Anesthesia Quick Evaluation  "

## 2024-05-24 NOTE — H&P (Signed)
 ORTHOPAEDIC SURGERY H&P  Subjective:  The patient presents for right ankle hardware.   Past Medical History:  Diagnosis Date   Acute pancreatitis 12/29/2021   Alcohol abuse 06/24/2022   Alcohol induced acute pancreatitis 06/10/2022   Alcohol use disorder, severe, dependence (HCC) 05/20/2018   Alcoholic hepatitis without ascites (HCC) 06/19/2022   Anasarca 06/20/2022   Anxiety 2013   Generalized anxiety disorder 04/01/2013   History of hypertension 11/15/2021   Hypertension 2014   Pancreatitis    Warts, genital 07/21/2019    Past Surgical History:  Procedure Laterality Date   CESAREAN SECTION  03/18/2007   ORIF ANKLE FRACTURE Right 12/09/2023   Procedure: OPEN REDUCTION INTERNAL FIXATION (ORIF) ANKLE FRACTURE;  Surgeon: Barton Drape, MD;  Location: [facility];  Service: Orthopedics;  Laterality: Right;  right ankle Open reduction internal fixation  possible syndesmosis and/or deltoid fixation     Show/hide medication list[1]   Allergies[2]  Social History   Socioeconomic History   Marital status: Single    Spouse name: Not on file   Number of children: 1   Years of education: Not on file   Highest education level: Some college, no degree  Occupational History   Not on file  Tobacco Use   Smoking status: Every Day    Current packs/day: 0.50    Average packs/day: 0.5 packs/day for 10.0 years (5.0 ttl pk-yrs)    Types: Cigarettes   Smokeless tobacco: Never  Vaping Use   Vaping status: Never Used  Substance and Sexual Activity   Alcohol use: Not Currently    Alcohol/week: 7.0 standard drinks of alcohol    Types: 1 Cans of beer, 6 Shots of liquor per week    Comment: relapsed as of 05/19/2024   Drug use: No   Sexual activity: Yes    Partners: Male    Birth control/protection: None  Other Topics Concern   Not on file  Social History Narrative   Lives in [place] with daughter   Has long term boyfriend.   Social Drivers of Health    Tobacco Use: High Risk (05/19/2024)   Patient History    Smoking Tobacco Use: Every Day    Smokeless Tobacco Use: Never    Passive Exposure: Not on file  Financial Resource Strain: Not on file  Food Insecurity: No Food Insecurity (06/24/2022)   Hunger Vital Sign    Worried About Running Out of Food in the Last Year: Never true    Ran Out of Food in the Last Year: Never true  Transportation Needs: No Transportation Needs (06/24/2022)   PRAPARE - Administrator, Civil Service (Medical): No    Lack of Transportation (Non-Medical): No  Physical Activity: Not on file  Stress: Not on file  Social Connections: Not on file  Intimate Partner Violence: Not At Risk (06/24/2022)   Humiliation, Afraid, Rape, and Kick questionnaire    Fear of Current or Ex-Partner: No    Emotionally Abused: No    Physically Abused: No    Sexually Abused: No  Depression (PHQ2-9): Low Risk (12/23/2023)   Depression (PHQ2-9)    PHQ-2 Score: 0  Alcohol Screen: Not on file  Housing: Low Risk (06/24/2022)   Housing    Last Housing Risk Score: 0  Utilities: Not At Risk (06/24/2022)   AHC Utilities    Threatened with loss of utilities: No  Health Literacy: Not on file     History reviewed. No pertinent family history.   Review  of Systems Pertinent items are noted in HPI.  Objective: Vital signs in last 24 hours:    05/19/2024    8:16 PM 05/19/2024    3:22 PM 05/19/2024    3:19 PM  Vitals with BMI  Weight  230 lbs   BMI  37.14   Systolic 149  157  Diastolic 103  112  Pulse 75  89      EXAM: General: Well nourished, well developed. Awake, alert and oriented to time, place, person. Normal mood and affect. No apparent distress. Breathing room air.  Operative Lower Extremity: Alignment - Neutral Deformity - None Skin intact Tenderness to palpation - none 5/5 TA, PT, GS, Per, EHL, FHL Sensation intact to light touch throughout Palpable DP and PT pulses Special testing: None  The  contralateral foot/ankle was examined for comparison and noted to be neurovascularly intact with no localized deformity, swelling, or tenderness.  Imaging Review All images taken were independently reviewed by me.  Assessment/Plan: The clinical and radiographic findings were reviewed and discussed at length with the patient.  The patient presents for right ankle removal of deep ankle hardware (syndesmosis fixation), possible revision syndesmosis fixation.  We spoke at length about the natural course of these findings. We discussed nonoperative and operative treatment options in detail.  The risks and benefits were presented and reviewed. The risks due to hardware/suture failure and/or irritation (if removing hardware: inability to remove part/all of hardware, recurrent instability), new/persistent infection, stiffness, nerve/vessel/tendon injury or rerupture of repaired tendon, nonunion/malunion, allograft usage, wound healing issues, development of arthritis, failure of this surgery, possibility of external fixation with delayed definitive surgery, need for further surgery, thromboembolic events, anesthesia/medical complications, amputation, death among others were discussed.  Veronica Calhoun  Orthopaedic Surgery [facility]     [1] (Not in an outpatient encounter) [2]  Allergies Allergen Reactions   Lisinopril Swelling    Reports Lip swelling   Amlodipine Other (See Comments)    like I was going to pass out

## 2024-05-24 NOTE — Discharge Instructions (Signed)
 Lillia Mountain, MD [facility]  Please read the following information regarding your care after surgery.  Medications   - Oxycodone  5 mg every 6 hours as needed for pain - Aspirin  81 mg twice daily as scheduled to prevent blood clots - Colace 100 mg twice daily as needed for constipation - Zofran  4 mg every 8 hours as needed for nausea/vomiting  We send above prescriptions to your pharmacy on file.  In addition you may also use: ? acetaminophen  (Tylenol ) 500 mg every 4-6 hours as you need for minor to moderate pain  Resume all other routine medications per usual or as directed by your PCP/other specialists.  ? To help prevent blood clots, you should also get up every hour while you are awake to move around.  Weight Bearing ? OK to walk on the operative leg only AFTER the nerve block has completely worn off.  Cast / Splint / Dressing ? Keep your dressing clean and dry.  Dont put anything (coat hanger, pencil, etc) down inside of it.  If it gets wet, please notify the office immediately.  Swelling IMPORTANT: It is normal for you to have swelling where you had surgery. To reduce swelling and pain, keep at least 3 pillows under your leg so that your toes are above your nose and your heel is above the level of your hip.  It may be necessary to keep your foot or leg elevated for several weeks.  This is critical to helping your incisions heal and your pain to feel better.  Follow Up Call my office at [phone] when you are discharged from the hospital or surgery center to schedule an appointment to be seen within 7-10 days after surgery.  Call my office at [phone] if you develop a fever >101.5 F, nausea, vomiting, bleeding from the surgical site or severe pain.     Post Anesthesia Home Care Instructions  Activity: Get plenty of rest for the remainder of the day. A responsible individual must stay with you for 24 hours following the procedure.  For the next 24 hours, DO  NOT: -Drive a car -Advertising copywriter -Drink alcoholic beverages -Take any medication unless instructed by your physician -Make any legal decisions or sign important papers.  Meals: Start with liquid foods such as gelatin or soup. Progress to regular foods as tolerated. Avoid greasy, spicy, heavy foods. If nausea and/or vomiting occur, drink only clear liquids until the nausea and/or vomiting subsides. Call your physician if vomiting continues.  Special Instructions/Symptoms: Your throat may feel dry or sore from the anesthesia or the breathing tube placed in your throat during surgery. If this causes discomfort, gargle with warm salt water. The discomfort should disappear within 24 hours.  If you had a scopolamine patch placed behind your ear for the management of post- operative nausea and/or vomiting:  1. The medication in the patch is effective for 72 hours, after which it should be removed.  Wrap patch in a tissue and discard in the trash. Wash hands thoroughly with soap and water. 2. You may remove the patch earlier than 72 hours if you experience unpleasant side effects which may include dry mouth, dizziness or visual disturbances. 3. Avoid touching the patch. Wash your hands with soap and water after contact with the patch.

## 2024-05-25 ENCOUNTER — Ambulatory Visit (HOSPITAL_BASED_OUTPATIENT_CLINIC_OR_DEPARTMENT_OTHER): Payer: MEDICAID | Admitting: Anesthesiology

## 2024-05-25 ENCOUNTER — Encounter (HOSPITAL_BASED_OUTPATIENT_CLINIC_OR_DEPARTMENT_OTHER)
Admission: RE | Disposition: A | Discharge status: Home / Self Care | Payer: Self-pay | Source: Home / Self Care | Attending: Orthopaedic Surgery

## 2024-05-25 ENCOUNTER — Encounter (HOSPITAL_BASED_OUTPATIENT_CLINIC_OR_DEPARTMENT_OTHER): Payer: Self-pay | Admitting: Orthopaedic Surgery

## 2024-05-25 ENCOUNTER — Other Ambulatory Visit: Payer: Self-pay

## 2024-05-25 ENCOUNTER — Ambulatory Visit (HOSPITAL_BASED_OUTPATIENT_CLINIC_OR_DEPARTMENT_OTHER)
Admission: RE | Admit: 2024-05-25 | Discharge: 2024-05-25 | Disposition: A | Discharge status: Home / Self Care | Payer: MEDICAID | Attending: Orthopaedic Surgery | Admitting: Orthopaedic Surgery

## 2024-05-25 ENCOUNTER — Ambulatory Visit (HOSPITAL_BASED_OUTPATIENT_CLINIC_OR_DEPARTMENT_OTHER): Payer: MEDICAID

## 2024-05-25 ENCOUNTER — Encounter (HOSPITAL_BASED_OUTPATIENT_CLINIC_OR_DEPARTMENT_OTHER): Payer: MEDICAID | Admitting: Anesthesiology

## 2024-05-25 DIAGNOSIS — Z01818 Encounter for other preprocedural examination: Secondary | ICD-10-CM

## 2024-05-25 DIAGNOSIS — F32A Depression, unspecified: Secondary | ICD-10-CM | POA: Insufficient documentation

## 2024-05-25 DIAGNOSIS — S8261XA Displaced fracture of lateral malleolus of right fibula, initial encounter for closed fracture: Secondary | ICD-10-CM | POA: Insufficient documentation

## 2024-05-25 DIAGNOSIS — Z79899 Other long term (current) drug therapy: Secondary | ICD-10-CM | POA: Diagnosis not present

## 2024-05-25 DIAGNOSIS — X58XXXA Exposure to other specified factors, initial encounter: Secondary | ICD-10-CM | POA: Diagnosis not present

## 2024-05-25 DIAGNOSIS — E785 Hyperlipidemia, unspecified: Secondary | ICD-10-CM | POA: Diagnosis not present

## 2024-05-25 DIAGNOSIS — F419 Anxiety disorder, unspecified: Secondary | ICD-10-CM | POA: Diagnosis not present

## 2024-05-25 DIAGNOSIS — K219 Gastro-esophageal reflux disease without esophagitis: Secondary | ICD-10-CM | POA: Insufficient documentation

## 2024-05-25 DIAGNOSIS — I10 Essential (primary) hypertension: Secondary | ICD-10-CM | POA: Diagnosis not present

## 2024-05-25 DIAGNOSIS — Z472 Encounter for removal of internal fixation device: Secondary | ICD-10-CM

## 2024-05-25 DIAGNOSIS — F17210 Nicotine dependence, cigarettes, uncomplicated: Secondary | ICD-10-CM | POA: Insufficient documentation

## 2024-05-25 HISTORY — PX: HARDWARE REMOVAL: SHX979

## 2024-05-25 LAB — POCT PREGNANCY, URINE: Preg Test, Ur: NEGATIVE

## 2024-05-25 SURGERY — REMOVAL, HARDWARE
Anesthesia: General | Laterality: Right

## 2024-05-25 MED ORDER — CEFAZOLIN SODIUM-DEXTROSE 2-4 GM/100ML-% IV SOLN: 2.0000 g | INTRAVENOUS | Status: DC

## 2024-05-25 MED ORDER — ONDANSETRON HCL 4 MG/2ML IJ SOLN
INTRAMUSCULAR | Status: DC | PRN
  Administered 2024-05-25: 4 mg via INTRAVENOUS

## 2024-05-25 MED ORDER — MIDAZOLAM HCL (PF) 2 MG/2ML IJ SOLN
INTRAMUSCULAR | Status: DC | PRN
  Administered 2024-05-25: 2 mg via INTRAVENOUS

## 2024-05-25 MED ORDER — MIDAZOLAM HCL 2 MG/2ML IJ SOLN
INTRAMUSCULAR | Status: AC
  Filled 2024-05-25: qty 2

## 2024-05-25 MED ORDER — VANCOMYCIN HCL 500 MG IV SOLR
INTRAVENOUS | Status: AC
  Filled 2024-05-25: qty 20

## 2024-05-25 MED ORDER — LIDOCAINE 2% (20 MG/ML) 5 ML SYRINGE
INTRAMUSCULAR | Status: AC
  Filled 2024-05-25: qty 5

## 2024-05-25 MED ORDER — ONDANSETRON HCL 4 MG/2ML IJ SOLN
INTRAMUSCULAR | Status: AC
  Filled 2024-05-25: qty 2

## 2024-05-25 MED ORDER — FENTANYL CITRATE (PF) 100 MCG/2ML IJ SOLN
INTRAMUSCULAR | Status: AC
  Filled 2024-05-25: qty 2

## 2024-05-25 MED ORDER — OXYCODONE HCL 5 MG PO TABS: 5.0000 mg | ORAL_TABLET | Freq: Once | ORAL | Status: DC | PRN

## 2024-05-25 MED ORDER — FENTANYL CITRATE (PF) 100 MCG/2ML IJ SOLN: 25.0000 ug | INTRAMUSCULAR | Status: DC | PRN

## 2024-05-25 MED ORDER — ASPIRIN 81 MG PO TBEC: 81.0000 mg | DELAYED_RELEASE_TABLET | Freq: Two times a day (BID) | ORAL | 0 refills | Status: DC

## 2024-05-25 MED ORDER — FENTANYL CITRATE (PF) 100 MCG/2ML IJ SOLN
25.0000 ug | INTRAMUSCULAR | Status: DC | PRN
  Administered 2024-05-25: 25 ug via INTRAVENOUS
  Administered 2024-05-25: 50 ug via INTRAVENOUS
  Administered 2024-05-25: 25 ug via INTRAVENOUS

## 2024-05-25 MED ORDER — PROPOFOL 10 MG/ML IV BOLUS
INTRAVENOUS | Status: DC | PRN
  Administered 2024-05-25: 200 mg via INTRAVENOUS

## 2024-05-25 MED ORDER — OXYCODONE HCL 5 MG/5ML PO SOLN: 5.0000 mg | Freq: Once | ORAL | Status: DC | PRN

## 2024-05-25 MED ORDER — BUPIVACAINE-EPINEPHRINE (PF) 0.5% -1:200000 IJ SOLN
INTRAMUSCULAR | Status: AC
  Filled 2024-05-25: qty 60

## 2024-05-25 MED ORDER — OXYCODONE HCL 5 MG PO TABS: 5.0000 mg | ORAL_TABLET | ORAL | 0 refills | Status: AC | PRN

## 2024-05-25 MED ORDER — CHLORHEXIDINE GLUCONATE 4 % EX SOLN: 60.0000 mL | Freq: Once | CUTANEOUS | Status: DC

## 2024-05-25 MED ORDER — BUPIVACAINE HCL (PF) 0.25 % IJ SOLN
INTRAMUSCULAR | Status: AC
  Filled 2024-05-25: qty 60

## 2024-05-25 MED ORDER — DEXAMETHASONE SOD PHOSPHATE PF 10 MG/ML IJ SOLN
INTRAMUSCULAR | Status: DC | PRN
  Administered 2024-05-25: 5 mg via INTRAVENOUS

## 2024-05-25 MED ORDER — 0.9 % SODIUM CHLORIDE (POUR BTL) OPTIME
TOPICAL | Status: DC | PRN
  Administered 2024-05-25: 1000 mL

## 2024-05-25 MED ORDER — PROPOFOL 10 MG/ML IV BOLUS
INTRAVENOUS | Status: AC
  Filled 2024-05-25: qty 20

## 2024-05-25 MED ORDER — DROPERIDOL 2.5 MG/ML IJ SOLN: 0.6250 mg | Freq: Once | INTRAMUSCULAR | Status: DC | PRN

## 2024-05-25 MED ORDER — BUPIVACAINE-EPINEPHRINE (PF) 0.25% -1:200000 IJ SOLN
INTRAMUSCULAR | Status: AC
  Filled 2024-05-25: qty 60

## 2024-05-25 MED ORDER — ONDANSETRON HCL 4 MG/2ML IJ SOLN: 4.0000 mg | Freq: Once | INTRAMUSCULAR | Status: DC | PRN

## 2024-05-25 MED ORDER — ONDANSETRON 4 MG PO TBDP: 4.0000 mg | ORAL_TABLET | Freq: Three times a day (TID) | ORAL | 0 refills | Status: AC | PRN

## 2024-05-25 MED ORDER — DEXAMETHASONE SOD PHOSPHATE PF 10 MG/ML IJ SOLN
INTRAMUSCULAR | Status: AC
  Filled 2024-05-25: qty 1

## 2024-05-25 MED ORDER — BUPIVACAINE-EPINEPHRINE 0.5% -1:200000 IJ SOLN
INTRAMUSCULAR | Status: DC | PRN
  Administered 2024-05-25: 30 mL

## 2024-05-25 MED ORDER — LIDOCAINE HCL (CARDIAC) PF 100 MG/5ML IV SOSY
PREFILLED_SYRINGE | INTRAVENOUS | Status: DC | PRN
  Administered 2024-05-25: 80 mg via INTRAVENOUS

## 2024-05-25 MED ORDER — LACTATED RINGERS IV SOLN: INTRAVENOUS | Status: DC

## 2024-05-25 MED ORDER — FENTANYL CITRATE (PF) 100 MCG/2ML IJ SOLN
INTRAMUSCULAR | Status: DC | PRN
  Administered 2024-05-25: 100 ug via INTRAVENOUS

## 2024-05-25 MED ORDER — LACTATED RINGERS IV SOLN: INTRAVENOUS | Status: DC | PRN

## 2024-05-25 MED ORDER — DOCUSATE SODIUM 100 MG PO CAPS: 100.0000 mg | ORAL_CAPSULE | Freq: Two times a day (BID) | ORAL | 0 refills | Status: DC

## 2024-05-25 MED ORDER — CEFAZOLIN SODIUM-DEXTROSE 2-3 GM-%(50ML) IV SOLR
INTRAVENOUS | Status: DC | PRN
  Administered 2024-05-25: 2 g via INTRAVENOUS

## 2024-05-25 NOTE — Op Note (Signed)
 05/25/2024  2:05 PM   PATIENT: Veronica Calhoun  39 y.o. female  MRN: [id number]   PRE-OPERATIVE DIAGNOSIS:   Right ankle symptomatic orthopaedic hardware   POST-OPERATIVE DIAGNOSIS:   Same   PROCEDURE: 1] Right ankle removal of deep hardware (syndesmosis fixation) 2] Right ankle revision syndesmosis open reduction internal fixation   SURGEON:  Lillia Mountain, MD   ASSISTANT: None   ANESTHESIA: General   EBL: Minimal   TOURNIQUET:    Total Tourniquet Time Documented: area (Right) - 11 minutes Total: area (Right) - 11 minutes    COMPLICATIONS: None apparent   DISPOSITION: Extubated, awake and stable to recovery.   INDICATION FOR PROCEDURE: The patient presented with above diagnosis.  We discussed the diagnosis, alternative treatment options, risks and benefits of the above surgical intervention, as well as alternative non-operative treatments. All questions/concerns were addressed and the patient/family demonstrated appropriate understanding of the diagnosis, the procedure, the postoperative course, and overall prognosis. The patient wished to proceed with surgical intervention and signed an informed surgical consent as such, in each others presence prior to surgery.   PROCEDURE IN DETAIL: After preoperative consent was obtained and the correct operative site was identified, the patient was brought to the operating room supine on stretcher. General anesthesia was induced. Preoperative antibiotics were administered. Surgical timeout was taken. The patient was then positioned supine. The operative lower extremity was prepped and draped in standard sterile fashion with a tourniquet around the thigh. The extremity was exsanguinated and the tourniquet was inflated to 275 mmHg.  Prior lateralapproach was utilized over the distal fibula and dissection carried down to the level of plate. The syndesmosis fixation was removed completely.   Next, a dynamic suture  fixation system (ZipTight device) was implanted through the fibula plate in cannulated fashion to fix the syndesmosis. Anchor/button position was verified along anteromedial tibial cortex by fluoroscopy. A repeat stress radiograph showed complete stability of the ankle mortise to testing.   The surgical sites were thoroughly irrigated. The tourniquet was deflated and hemostasis achieved. The skin was closed without tension.    The leg was cleaned with saline and sterile mepitel dressings with gauze were applied. A well padded sterile wrap was applied. The patient was awakened from anesthesia and transported to the recovery room in stable condition.    FOLLOW UP PLAN: -transfer to PACU, then home -strict NWB operative extremity until nerve block wears off, then OK to WBAT, maximum elevation -maintain dressings until follow up -DVT ppx: Aspirin  81 mg twice daily -follow up as outpatient within 7-10 days for wound check -sutures out in 2-3 weeks in outpatient office   RADIOGRAPHS: AP, lateral, oblique and stress radiographs of the operative ankle were obtained intraoperatively. These showed interval removal of the syndesmosis screws. Manual stress radiographs were taken and the joints were noted to be stable following hardware removal and revision fixation. No other acute injuries are noted.

## 2024-05-25 NOTE — Transfer of Care (Signed)
 Immediate Anesthesia Transfer of Care Note  Patient: Carsyn Boster Tadesse  Procedure(s) Performed: Right ankle removal of deep hardware (syndesmosis fixation), possible revision syndesmosis fixation (Right)  Patient Location: PACU  Anesthesia Type:General  Level of Consciousness: awake, alert , and patient cooperative  Airway & Oxygen Therapy: Patient Spontanous Breathing and Patient connected to face mask oxygen  Post-op Assessment: Report given to RN and Post -op Vital signs reviewed and stable  Post vital signs: Reviewed and stable  Last Vitals:  Vitals Value Taken Time  BP    Temp    Pulse 74 05/25/24 09:15  Resp 16 05/25/24 09:15  SpO2 100 % 05/25/24 09:15  Vitals shown include unfiled device data.  Last Pain:  Vitals:   05/25/24 0724  TempSrc: Temporal  PainSc: 0-No pain      Patients Stated Pain Goal: 3 (05/25/24 0724)  Complications: No notable events documented.

## 2024-05-25 NOTE — H&P (Signed)
 H&P Update:  -History and Physical Reviewed  -Patient has been re-examined  -No change in the plan of care  -The risks and benefits were presented and reviewed. The risks due to hardware/suture failure and/or irritation (if removing hardware: inability to remove part/all of hardware, recurrent instability), new/persistent infection, stiffness, nerve/vessel/tendon injury or rerupture of repaired tendon, nonunion/malunion, allograft usage, wound healing issues, development of arthritis, failure of this surgery, possibility of external fixation with delayed definitive surgery, need for further surgery, thromboembolic events, anesthesia/medical complications, amputation, death among others were discussed. The patient acknowledged the explanation, agreed to proceed with the plan and a consent was signed.  Lillia Mountain

## 2024-05-25 NOTE — Anesthesia Postprocedure Evaluation (Signed)
"   Anesthesia Post Note  Patient: Veronica Calhoun  Procedure(s) Performed: Right ankle removal of deep hardware (syndesmosis fixation), possible revision syndesmosis fixation (Right)     Patient location during evaluation: PACU Anesthesia Type: General Level of consciousness: awake and alert Pain management: pain level controlled Vital Signs Assessment: post-procedure vital signs reviewed and stable Respiratory status: spontaneous breathing, nonlabored ventilation and respiratory function stable Cardiovascular status: blood pressure returned to baseline and stable Postop Assessment: no apparent nausea or vomiting Anesthetic complications: no   No notable events documented.  Last Vitals:  Vitals:   05/25/24 0945 05/25/24 0957  BP: 114/77 108/81  Pulse: 75 74  Resp: 18 16  Temp:  (!) 36.3 C  SpO2: 97% 97%    Last Pain:  Vitals:   05/25/24 0957  TempSrc:   PainSc: 2         RLE Motor Response: Responds to commands;Purposeful movement (05/25/24 0957) RLE Sensation: Full sensation (05/25/24 0957)      Deontae Robson A.      "

## 2024-05-25 NOTE — Anesthesia Procedure Notes (Signed)
 Procedure Name: LMA Insertion Date/Time: 05/25/2024 8:57 AM  Performed by: Burnard Rosaline HERO, CRNAPre-anesthesia Checklist: Patient identified, Emergency Drugs available, Suction available and Patient being monitored Patient Re-evaluated:Patient Re-evaluated prior to induction Oxygen Delivery Method: Circle system utilized Preoxygenation: Pre-oxygenation with 100% oxygen Induction Type: IV induction Ventilation: Mask ventilation without difficulty LMA: LMA inserted LMA Size: 4.0 Number of attempts: 1 Airway Equipment and Method: Bite block Placement Confirmation: positive ETCO2, breath sounds checked- equal and bilateral and CO2 detector Tube secured with: Tape Dental Injury: Teeth and Oropharynx as per pre-operative assessment

## 2024-05-26 ENCOUNTER — Encounter (HOSPITAL_BASED_OUTPATIENT_CLINIC_OR_DEPARTMENT_OTHER): Payer: Self-pay | Admitting: Orthopaedic Surgery

## 2024-06-01 ENCOUNTER — Telehealth: Payer: MEDICAID | Admitting: Physician Assistant

## 2024-06-01 DIAGNOSIS — B3731 Acute candidiasis of vulva and vagina: Secondary | ICD-10-CM

## 2024-06-02 MED ORDER — FLUCONAZOLE 150 MG PO TABS: 150.0000 mg | ORAL_TABLET | Freq: Every day | ORAL | 0 refills | Status: AC

## 2024-06-02 NOTE — Progress Notes (Signed)

## 2024-06-09 ENCOUNTER — Telehealth: Payer: MEDICAID | Admitting: Nurse Practitioner

## 2024-06-09 DIAGNOSIS — B9789 Other viral agents as the cause of diseases classified elsewhere: Secondary | ICD-10-CM | POA: Diagnosis not present

## 2024-06-09 DIAGNOSIS — J329 Chronic sinusitis, unspecified: Secondary | ICD-10-CM | POA: Diagnosis not present

## 2024-06-09 DIAGNOSIS — R051 Acute cough: Secondary | ICD-10-CM | POA: Diagnosis not present

## 2024-06-09 MED ORDER — PROMETHAZINE-DM 6.25-15 MG/5ML PO SYRP: 5.0000 mL | ORAL_SOLUTION | Freq: Four times a day (QID) | ORAL | 0 refills | Status: AC | PRN

## 2024-06-09 NOTE — Patient Instructions (Signed)
" °  Shauniece S Ohanesian, thank you for joining Hadassah Fireman, NP for today's virtual visit.  While this provider is not your primary care provider (PCP), if your PCP is located in our provider database this encounter information will be shared with them immediately following your visit.   A [facility] MyChart account gives you access to today's visit and all your visits, tests, and labs performed at [facility]  click here if you don't have a [facility] MyChart account or go to [email or web address]  Consent: (Patient) Veronica Calhoun provided verbal consent for this virtual visit at the beginning of the encounter.  Current Medications:  Current Outpatient Medications:    Aspirin -Salicylamide-Caffeine (BC HEADACHE) 325-95-16 MG TABS, Take 1 Package by mouth daily as needed (for pain)., Disp: , Rfl:    Blood Pressure Monitoring (BLOOD PRESSURE KIT) DEVI, Take blood pressure reading 2 times daily., Disp: 1 each, Rfl: 0   cloNIDine  (CATAPRES ) 0.2 MG tablet, TAKE 1 TABLET(0.2 MG) BY MOUTH THREE TIMES DAILY, Disp: 90 tablet, Rfl: 3   fluconazole  (DIFLUCAN ) 150 MG tablet, Take 1 tablet (150 mg total) by mouth daily., Disp: 1 tablet, Rfl: 0   hydrOXYzine  (VISTARIL ) 25 MG capsule, TAKE 1 CAPSULE(25 MG) BY MOUTH AT BEDTIME, Disp: 30 capsule, Rfl: 0   metoprolol  succinate (TOPROL -XL) 50 MG 24 hr tablet, TAKE 1 TABLET(50 MG) BY MOUTH DAILY WITH OR IMMEDIATELY FOLLOWING A MEAL, Disp: 90 tablet, Rfl: 3   naproxen  (NAPROSYN ) 500 MG tablet, Take 1 tablet (500 mg total) by mouth 2 (two) times daily., Disp: 30 tablet, Rfl: 0   olmesartan -hydrochlorothiazide  (BENICAR  HCT) 40-12.5 MG tablet, TAKE 1 TABLET BY MOUTH DAILY, Disp: 90 tablet, Rfl: 1   ondansetron  (ZOFRAN ) 4 MG tablet, Take 1 tablet (4 mg total) by mouth every 6 (six) hours., Disp: 12 tablet, Rfl: 0   traZODone  (DESYREL ) 50 MG tablet, Take 1 tablet (50 mg total) by mouth at bedtime as needed for sleep., Disp: 30 tablet, Rfl: 3    Medications ordered in this encounter:  No orders of the defined types were placed in this encounter.    *If you need refills on other medications prior to your next appointment, please contact your pharmacy*  Follow-Up: Call back or seek an in-person evaluation if the symptoms worsen or if the condition fails to improve as anticipated.   Other Instructions Please pick up your prescription for cough medication to take as needed for symptom management You can also take OTC allergy medication such as loratidine or cetirizine for symptoms of viral sinusitis.  Continue drinking plenty of fluids.  Please seek further care in person for further evaluation of symptoms persist or worsen.    If you have been instructed to have an in-person evaluation today at a local Urgent Care facility, please use the link below. It will take you to a list of all of our available [facility], including address, phone number and hours of operation. Please do not delay care.  [facility]  If you or a family member do not have a primary care provider, use the link below to schedule a visit and establish care. When you choose a [facility] primary care physician or advanced practice provider, you gain a long-term partner in health. Find a Primary Care Provider  Learn more about [facility]'s in-office and virtual care options: [facility] - Get Care Now  "

## 2024-06-09 NOTE — Progress Notes (Signed)
 " Virtual Visit Consent   Rhylin Venters Oscar, you are scheduled for a virtual visit with a [facility] provider today. Just as with appointments in the office, your consent must be obtained to participate. Your consent will be active for this visit and any virtual visit you may have with one of our providers in the next 365 days. If you have a MyChart account, a copy of this consent can be sent to you electronically.  As this is a virtual visit, video technology does not allow for your provider to perform a traditional examination. This may limit your provider's ability to fully assess your condition. If your provider identifies any concerns that need to be evaluated in person or the need to arrange testing (such as labs, EKG, etc.), we will make arrangements to do so. Although advances in technology are sophisticated, we cannot ensure that it will always work on either your end or our end. If the connection with a video visit is poor, the visit may have to be switched to a telephone visit. With either a video or telephone visit, we are not always able to ensure that we have a secure connection.  By engaging in this virtual visit, you consent to the provision of healthcare and authorize for your insurance to be billed (if applicable) for the services provided during this visit. Depending on your insurance coverage, you may receive a charge related to this service.  I need to obtain your verbal consent now. Are you willing to proceed with your visit today? Blaire S Conran has provided verbal consent on 06/09/2024 for a virtual visit (video or telephone). Hadassah Fireman, NP  Date: 06/09/2024 11:59 AM   Virtual Visit via Video Note   I, Hadassah Fireman, connected with  Takiesha Mcdevitt Hommel  ([id number], 17-Feb-1986) on 06/09/24 at 12:00 PM EST by a video-enabled telemedicine application and verified that I am speaking with the correct person using two identifiers.  Location: Patient: Virtual Visit Location Patient:  Home Provider: Virtual Visit Location Provider: Home Office   I discussed the limitations of evaluation and management by telemedicine and the availability of in person appointments. The patient expressed understanding and agreed to proceed.    History of Present Illness: Veronica Calhoun is a 39 y.o. who identifies as a female who was assigned female at birth, and is being seen today for productive cough and congestion x2 days. Endorses some sinus pressure in the maxillary region. No dental pain.  Missed work due to coughing yesterday and today, requesting return to work note.  Weakness yesterday. Denies fevers/chills. Decreased appetite, but able to keep fluids down.  Denies nausea, vomiting, fevers, chills, body aches, chest pain or SOB  Robitussin, some relief.  Daughter had flu x3 weeks ago- denies similar symptoms.   HPI: HPI  Problems:  Patient Active Problem List   Diagnosis Date Noted   Closed fracture of lateral malleolus 12/03/2023   Acute ankle pain 12/01/2023   Hallux valgus of right foot 08/19/2023   Class 3 severe obesity due to excess calories with body mass index (BMI) of 40.0 to 44.9 in adult [facility]) 03/20/2023   Tooth pain 03/20/2023   Left leg pain 11/27/2022   Cervical cancer screening 11/27/2022   Difficulty sleeping 11/27/2022   Hyperlipidemia 11/27/2022   Acute alcoholic pancreatitis 08/30/2022   GERD without esophagitis 06/24/2022   Alcohol abuse 06/24/2022   Elevated LFTs 06/24/2022   Hypoalbuminemia 06/24/2022   Sepsis due to pneumonia (HCC) 06/23/2022  Anasarca 06/20/2022   Alcoholic hepatitis without ascites (HCC) 06/19/2022   Alcoholic hepatitis with ascites (HCC) 06/16/2022   Abdominal pain, epigastric 06/16/2022   Ileus (HCC) 06/16/2022   Tobacco use disorder 06/11/2022   Tachycardia 06/11/2022   Hypokalemia 06/11/2022   Hypertensive crisis 06/10/2022   Transaminitis 06/10/2022   Obesity, Class III, BMI 40-49.9 (morbid obesity) (HCC)  12/29/2021   Leukocytosis 12/28/2021   Abnormal cytology smear of cervix 11/15/2021   History of hypertension 11/15/2021   History of abnormal cervical Papanicolaou smear 07/11/2021   Nicotine  dependence 07/11/2021   Benign essential hypertension 07/11/2021   ASCUS with positive high risk HPV cervical 07/21/2019   Warts, genital 07/21/2019   Depression 04/15/2019   Alcohol use disorder, severe, dependence (HCC) 05/20/2018   Panic attacks 04/01/2013   Essential hypertension 05/05/2012   Anxiety 05/06/2011    Allergies: Allergies[1] Medications: Current Medications[2]  Observations/Objective: Patient is well-developed, well-nourished in no acute distress. Pleasant Resting comfortably at home.  Head is normocephalic, atraumatic.  No labored breathing. Able to speak in full, clear sentences with no issues.  Speech is clear and coherent with logical content.  Patient is alert and oriented at baseline.   Assessment and Plan: 1. Viral sinusitis (Primary) - promethazine -dextromethorphan  (PROMETHAZINE -DM) 6.25-15 MG/5ML syrup; Take 5 mLs by mouth 4 (four) times daily as needed for cough.  Dispense: 118 mL; Refill: 0  2. Acute cough - promethazine -dextromethorphan  (PROMETHAZINE -DM) 6.25-15 MG/5ML syrup; Take 5 mLs by mouth 4 (four) times daily as needed for cough.  Dispense: 118 mL; Refill: 0  Based on symptoms and virtual exam during visit, more likely viral sinusitis vs. Bacterial sinusitis vs Flu/COVID. She agrees.  Rx for cough medication for further symptom management sent to pharmacy of choice.  Push fluids.  Discussed red flag s/s warranting further in personal evaluation, if needed  All questions answered. Work note provided. States understanding and agrees with plan.   Follow Up Instructions: I discussed the assessment and treatment plan with the patient. The patient was provided an opportunity to ask questions and all were answered. The patient agreed with the plan and  demonstrated an understanding of the instructions.  A copy of instructions were sent to the patient via MyChart unless otherwise noted below.   Patient has requested to receive PHI (AVS, Work Notes, etc) pertaining to this video visit through e-mail as they are currently without active MyChart. They have voiced understand that email is not considered secure and their health information could be viewed by someone other than the patient.   The patient was advised to call back or seek an in-person evaluation if the symptoms worsen or if the condition fails to improve as anticipated.    Hadassah Fireman, NP     [1]  Allergies Allergen Reactions   Lisinopril Swelling    Reports Lip swelling   Amlodipine Other (See Comments)    like I was going to pass out  [2]  Current Outpatient Medications:    promethazine -dextromethorphan  (PROMETHAZINE -DM) 6.25-15 MG/5ML syrup, Take 5 mLs by mouth 4 (four) times daily as needed for cough., Disp: 118 mL, Rfl: 0   Aspirin -Salicylamide-Caffeine (BC HEADACHE) 325-95-16 MG TABS, Take 1 Package by mouth daily as needed (for pain)., Disp: , Rfl:    Blood Pressure Monitoring (BLOOD PRESSURE KIT) DEVI, Take blood pressure reading 2 times daily., Disp: 1 each, Rfl: 0   cloNIDine  (CATAPRES ) 0.2 MG tablet, TAKE 1 TABLET(0.2 MG) BY MOUTH THREE TIMES DAILY, Disp: 90 tablet, Rfl: 3  fluconazole  (DIFLUCAN ) 150 MG tablet, Take 1 tablet (150 mg total) by mouth daily., Disp: 1 tablet, Rfl: 0   hydrOXYzine  (VISTARIL ) 25 MG capsule, TAKE 1 CAPSULE(25 MG) BY MOUTH AT BEDTIME, Disp: 30 capsule, Rfl: 0   metoprolol  succinate (TOPROL -XL) 50 MG 24 hr tablet, TAKE 1 TABLET(50 MG) BY MOUTH DAILY WITH OR IMMEDIATELY FOLLOWING A MEAL, Disp: 90 tablet, Rfl: 3   naproxen  (NAPROSYN ) 500 MG tablet, Take 1 tablet (500 mg total) by mouth 2 (two) times daily., Disp: 30 tablet, Rfl: 0   olmesartan -hydrochlorothiazide  (BENICAR  HCT) 40-12.5 MG tablet, TAKE 1 TABLET BY MOUTH DAILY, Disp: 90 tablet,  Rfl: 1   ondansetron  (ZOFRAN ) 4 MG tablet, Take 1 tablet (4 mg total) by mouth every 6 (six) hours., Disp: 12 tablet, Rfl: 0   traZODone  (DESYREL ) 50 MG tablet, Take 1 tablet (50 mg total) by mouth at bedtime as needed for sleep., Disp: 30 tablet, Rfl: 3  "
